# Patient Record
Sex: Male | Born: 1950
Health system: Southern US, Community
[De-identification: ages and names within clinical notes are randomized; demographics above are authoritative.]

## PROBLEM LIST (undated history)

## (undated) DIAGNOSIS — K219 Gastro-esophageal reflux disease without esophagitis: Secondary | ICD-10-CM

## (undated) DIAGNOSIS — J449 Chronic obstructive pulmonary disease, unspecified: Secondary | ICD-10-CM

## (undated) DIAGNOSIS — I700 Atherosclerosis of aorta: Secondary | ICD-10-CM

## (undated) DIAGNOSIS — R112 Nausea with vomiting, unspecified: Secondary | ICD-10-CM

## (undated) DIAGNOSIS — C801 Malignant (primary) neoplasm, unspecified: Secondary | ICD-10-CM

## (undated) DIAGNOSIS — I2510 Atherosclerotic heart disease of native coronary artery without angina pectoris: Secondary | ICD-10-CM

## (undated) DIAGNOSIS — C61 Malignant neoplasm of prostate: Secondary | ICD-10-CM

## (undated) DIAGNOSIS — Z9889 Other specified postprocedural states: Secondary | ICD-10-CM

## (undated) DIAGNOSIS — I10 Essential (primary) hypertension: Secondary | ICD-10-CM

## (undated) DIAGNOSIS — F419 Anxiety disorder, unspecified: Secondary | ICD-10-CM

## (undated) DIAGNOSIS — M1990 Unspecified osteoarthritis, unspecified site: Secondary | ICD-10-CM

## (undated) HISTORY — PX: KNEE SURGERY: SHX244

## (undated) HISTORY — PX: EYE SURGERY: SHX253

## (undated) HISTORY — PX: TONSILLECTOMY: SUR1361

---
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
## 1993-12-03 HISTORY — PX: KNEE SURGERY: SHX244

## 1994-12-03 HISTORY — PX: FLEXIBLE SIGMOIDOSCOPY: SHX1649

## 2001-12-03 HISTORY — PX: COLONOSCOPY: SHX174

## 2004-04-20 HISTORY — PX: HEMORRHOID SURGERY: SHX153

## 2015-07-12 DIAGNOSIS — H269 Unspecified cataract: Secondary | ICD-10-CM | POA: Insufficient documentation

## 2015-09-07 ENCOUNTER — Encounter: Payer: Self-pay | Admitting: *Deleted

## 2015-09-07 DIAGNOSIS — Z0000 Encounter for general adult medical examination without abnormal findings: Secondary | ICD-10-CM | POA: Insufficient documentation

## 2015-09-14 ENCOUNTER — Encounter: Payer: Self-pay | Admitting: General Surgery

## 2015-09-14 ENCOUNTER — Ambulatory Visit (INDEPENDENT_AMBULATORY_CARE_PROVIDER_SITE_OTHER): Payer: No Typology Code available for payment source | Admitting: General Surgery

## 2015-09-14 VITALS — BP 140/60 | HR 76 | Resp 14 | Ht 71.0 in | Wt 225.0 lb

## 2015-09-14 DIAGNOSIS — K429 Umbilical hernia without obstruction or gangrene: Secondary | ICD-10-CM

## 2015-09-14 NOTE — Progress Notes (Signed)
Patient ID: Adrian Hart, male   DOB: 19-Aug-1951, 64 y.o.   MRN: [id number]  Chief Complaint  Patient presents with  . Other    umbilical hernia    HPI Adrian Hart is a 64 y.o. male here today for a evaluation of a umbilical hernia. Patient states he noticed this area about 5-6 years ago when he was lifting office furniture. However, about 3 years ago it started bulging out. Bowels moving regular and daily. He denies any pain to the area but does have some soreness with lifting. He states up until about 3 weeks ago he could push it back in but not anymore.   HPI  No past medical history on file.  Past Surgical History  Procedure Laterality Date  . Hemorrhoid surgery  04-20-04    Dr Bary Castilla  . Colonoscopy  2003    Dr Alveta Heimlich  . Knee surgery    . Flexible sigmoidoscopy  1996    Dr Bary Castilla    No family history on file.  Social History Social History  Substance Use Topics  . Smoking status: Former Smoker -- 1.00 packs/day for 40 years    Types: Cigarettes  . Smokeless tobacco: Never Used  . Alcohol Use: No    No Known Allergies  Current Outpatient Prescriptions  Medication Sig Dispense Refill  . Cyanocobalamin (RA VITAMIN B-12 TR) 1000 MCG TBCR Take by mouth.    . folic acid (FOLVITE) 132 MCG tablet Take 400 mcg by mouth daily.    . magnesium oxide (MAG-OX) 400 MG tablet Take by mouth.    . Multiple Vitamin (MULTI-VITAMINS) TABS Take by mouth.    . pyridOXINE (VITAMIN B-6) 25 MG tablet Take by mouth.    . zinc gluconate 50 MG tablet Take 50 mg by mouth daily.     No current facility-administered medications for this visit.    Review of Systems Review of Systems  Constitutional: Negative.   Respiratory: Positive for cough.   Cardiovascular: Negative.   Gastrointestinal: Negative for nausea, diarrhea and constipation.    Blood pressure 140/60, pulse 76, resp. rate 14, height 5\' 11"  (1.803 m), weight 225 lb (102.059 kg).  Physical Exam Physical Exam   Constitutional: He is oriented to person, place, and time. He appears well-developed and well-nourished.  HENT:  Mouth/Throat: Oropharynx is clear and moist.  Eyes: Conjunctivae are normal. No scleral icterus.  Neck: Neck supple.  Cardiovascular: Normal rate, regular rhythm and normal heart sounds.   Pulmonary/Chest: Effort normal and breath sounds normal.  Abdominal: Soft. Normal appearance. A hernia is present.  2 cm umbilical defect.  Lymphadenopathy:    He has no cervical adenopathy.  Neurological: He is alert and oriented to person, place, and time.  Skin: Skin is warm and dry.  Psychiatric: His behavior is normal.    Data Reviewed 07/29/2002 colonoscopy for rectal bleeding reviewed. Internal hemorrhoids identified.    Assessment    Umbilical hernia.    Plan    Based on its size and his physical activity elective repair was encouraged.    Hernia precautions and incarceration were discussed with the patient. If they develop symptoms of an incarcerated hernia, they were encouraged to seek prompt medical attention.  I have recommended repair of the hernia with the possible use of mesh on an outpatient basis in the near future. The risk of infection was reviewed. The role of prosthetic mesh to minimize the risk of recurrence was reviewed.  Patient's surgery has been scheduled for 09-27-15  at [facility].   The patient reports he is scheduled for a colonoscopy consultation with the GI clinic at [facility].   PCP:  Adrian Hart 09/14/2015, 9:19 PM

## 2015-09-14 NOTE — Patient Instructions (Addendum)
Hernia, Adult A hernia is the bulging of an organ or tissue through a weak spot in the muscles of the abdomen (abdominal wall). Hernias develop most often near the navel or groin. There are many kinds of hernias. Common kinds include:  Femoral hernia. This kind of hernia develops under the groin in the upper thigh area.  Inguinal hernia. This kind of hernia develops in the groin or scrotum.  Umbilical hernia. This kind of hernia develops near the navel.  Hiatal hernia. This kind of hernia causes part of the stomach to be pushed up into the chest.  Incisional hernia. This kind of hernia bulges through a scar from an abdominal surgery. CAUSES This condition may be caused by:  Heavy lifting.  Coughing over a long period of time.  Straining to have a bowel movement.  An incision made during an abdominal surgery.  A birth defect (congenital defect).  Excess weight or obesity.  Smoking.  Poor nutrition.  Cystic fibrosis.  Excess fluid in the abdomen.  Undescended testicles. SYMPTOMS Symptoms of a hernia include:  A lump on the abdomen. This is the first sign of a hernia. The lump may become more obvious with standing, straining, or coughing. It may get bigger over time if it is not treated or if the condition causing it is not treated.  Pain. A hernia is usually painless, but it may become painful over time if treatment is delayed. The pain is usually dull and may get worse with standing or lifting heavy objects. Sometimes a hernia gets tightly squeezed in the weak spot (strangulated) or stuck there (incarcerated) and causes additional symptoms. These symptoms may include:  Vomiting.  Nausea.  Constipation.  Irritability. DIAGNOSIS A hernia may be diagnosed with:  A physical exam. During the exam your health care provider may ask you to cough or to make a specific movement, because a hernia is usually more visible when you move.  Imaging tests. These can  include:  X-rays.  Ultrasound.  CT scan. TREATMENT A hernia that is small and painless may not need to be treated. A hernia that is large or painful may be treated with surgery. Inguinal hernias may be treated with surgery to prevent incarceration or strangulation. Strangulated hernias are always treated with surgery, because lack of blood to the trapped organ or tissue can cause it to die. Surgery to treat a hernia involves pushing the bulge back into place and repairing the weak part of the abdomen. HOME CARE INSTRUCTIONS  Avoid straining.  Do not lift anything heavier than 10 lb (4.5 kg).  Lift with your leg muscles, not your back muscles. This helps avoid strain.  When coughing, try to cough gently.  Prevent constipation. Constipation leads to straining with bowel movements, which can make a hernia worse or cause a hernia repair to break down. You can prevent constipation by:  Eating a high-fiber diet that includes plenty of fruits and vegetables.  Drinking enough fluids to keep your urine clear or pale yellow. Aim to drink 6-8 glasses of water per day.  Using a stool softener as directed by your health care provider.  Lose weight, if you are overweight.  Do not use any tobacco products, including cigarettes, chewing tobacco, or electronic cigarettes. If you need help quitting, ask your health care provider.  Keep all follow-up visits as directed by your health care provider. This is important. Your health care provider may need to monitor your condition. SEEK MEDICAL CARE IF:  You have   swelling, redness, and pain in the affected area.  Your bowel habits change. SEEK IMMEDIATE MEDICAL CARE IF:  You have a fever.  You have abdominal pain that is getting worse.  You feel nauseous or you vomit.  You cannot push the hernia back in place by gently pressing on it while you are lying down.  The hernia:  Changes in shape or size.  Is stuck outside the  abdomen.  Becomes discolored.  Feels hard or tender.   This information is not intended to replace advice given to you by your health care provider. Make sure you discuss any questions you have with your health care provider.   Document Released: 11/19/2005 Document Revised: 12/10/2014 Document Reviewed: 09/29/2014 Elsevier Interactive Patient Education 2016 [place].  Patient's surgery has been scheduled for 09-27-15 at [facility].

## 2015-09-19 ENCOUNTER — Other Ambulatory Visit: Payer: Self-pay

## 2015-09-19 ENCOUNTER — Encounter: Payer: Self-pay | Admitting: *Deleted

## 2015-09-19 NOTE — Patient Instructions (Signed)
  Your procedure is scheduled on: 09-27-15 Report to [facility] To find out your arrival time please call [phone] between 1PM - 3PM on 09-26-15  Remember: Instructions that are not followed completely may result in serious medical risk, up to and including death, or upon the discretion of your surgeon and anesthesiologist your surgery may need to be rescheduled.    _X___ 1. Do not eat food or drink liquids after midnight. No gum chewing or hard candies.     _X___ 2. No Alcohol for 24 hours before or after surgery.   ____ 3. Bring all medications with you on the day of surgery if instructed.    ____ 4. Notify your doctor if there is any change in your medical condition     (cold, fever, infections).     Do not wear jewelry, make-up, hairpins, clips or nail polish.  Do not wear lotions, powders, or perfumes. You may wear deodorant.  Do not shave 48 hours prior to surgery. Men may shave face and neck.  Do not bring valuables to the hospital.    [facility] is not responsible for any belongings or valuables.               Contacts, dentures or bridgework may not be worn into surgery.  Leave your suitcase in the car. After surgery it may be brought to your room.  For patients admitted to the hospital, discharge time is determined by your  treatment team.   Patients discharged the day of surgery will not be allowed to drive home.   Please read over the following fact sheets that you were given:      ____ Take these medicines the morning of surgery with A SIP OF WATER:    1. IF MAGNESIUM OXIDE FALLS ON THE DAY OF YOUR SURGERY TAKE IT WITH A SMALL SIP OF WATER  2.   3.   4.  5.  6.  ____ Fleet Enema (as directed)   ____ Use CHG Soap as directed  ____ Use inhalers on the day of surgery  ____ Stop metformin 2 days prior to surgery    ____ Take 1/2 of usual insulin dose the night before surgery and none on the morning of surgery.   ____ Stop  Coumadin/Plavix/aspirin  ____ Stop Anti-inflammatories   ____ Stop supplements until after surgery.    ____ Bring C-Pap to the hospital.

## 2015-09-27 ENCOUNTER — Ambulatory Visit: Payer: No Typology Code available for payment source | Admitting: Anesthesiology

## 2015-09-27 ENCOUNTER — Ambulatory Visit
Admission: RE | Admit: 2015-09-27 | Discharge: 2015-09-27 | Disposition: A | Discharge status: Home / Self Care | Payer: No Typology Code available for payment source | Source: Ambulatory Visit | Attending: General Surgery | Admitting: General Surgery

## 2015-09-27 ENCOUNTER — Encounter
Admission: RE | Disposition: A | Discharge status: Home / Self Care | Payer: Self-pay | Source: Ambulatory Visit | Attending: General Surgery

## 2015-09-27 DIAGNOSIS — Z9889 Other specified postprocedural states: Secondary | ICD-10-CM | POA: Insufficient documentation

## 2015-09-27 DIAGNOSIS — Z87891 Personal history of nicotine dependence: Secondary | ICD-10-CM | POA: Diagnosis not present

## 2015-09-27 DIAGNOSIS — Z79899 Other long term (current) drug therapy: Secondary | ICD-10-CM | POA: Diagnosis not present

## 2015-09-27 DIAGNOSIS — K429 Umbilical hernia without obstruction or gangrene: Secondary | ICD-10-CM | POA: Insufficient documentation

## 2015-09-27 HISTORY — PX: UMBILICAL HERNIA REPAIR: SHX196

## 2015-09-27 HISTORY — PX: HERNIA REPAIR: SHX51

## 2015-09-27 HISTORY — DX: Chronic obstructive pulmonary disease, unspecified: J44.9

## 2015-09-27 HISTORY — DX: Gastro-esophageal reflux disease without esophagitis: K21.9

## 2015-09-27 SURGERY — REPAIR, HERNIA, UMBILICAL, ADULT
Anesthesia: General | Wound class: Clean

## 2015-09-27 MED ORDER — FAMOTIDINE 20 MG PO TABS
ORAL_TABLET | ORAL | Status: AC
  Administered 2015-09-27: 20 mg via ORAL
  Filled 2015-09-27: qty 1

## 2015-09-27 MED ORDER — CEFAZOLIN SODIUM-DEXTROSE 2-3 GM-% IV SOLR
INTRAVENOUS | Status: AC
  Administered 2015-09-27: 2 g via INTRAVENOUS
  Filled 2015-09-27: qty 50

## 2015-09-27 MED ORDER — SUCCINYLCHOLINE CHLORIDE 20 MG/ML IJ SOLN
INTRAMUSCULAR | Status: DC | PRN
  Administered 2015-09-27: 100 mg via INTRAVENOUS

## 2015-09-27 MED ORDER — MIDAZOLAM HCL 2 MG/2ML IJ SOLN
INTRAMUSCULAR | Status: DC | PRN
  Administered 2015-09-27: 2 mg via INTRAVENOUS

## 2015-09-27 MED ORDER — KETOROLAC TROMETHAMINE 30 MG/ML IJ SOLN
INTRAMUSCULAR | Status: DC | PRN
  Administered 2015-09-27: 30 mg via INTRAVENOUS

## 2015-09-27 MED ORDER — PROPOFOL 10 MG/ML IV BOLUS
INTRAVENOUS | Status: DC | PRN
  Administered 2015-09-27: 160 mg via INTRAVENOUS

## 2015-09-27 MED ORDER — NEOSTIGMINE METHYLSULFATE 10 MG/10ML IV SOLN
INTRAVENOUS | Status: DC | PRN
  Administered 2015-09-27: 4 mg via INTRAVENOUS

## 2015-09-27 MED ORDER — ROCURONIUM BROMIDE 100 MG/10ML IV SOLN
INTRAVENOUS | Status: DC | PRN
  Administered 2015-09-27: 5 mg via INTRAVENOUS
  Administered 2015-09-27: 25 mg via INTRAVENOUS

## 2015-09-27 MED ORDER — LIDOCAINE HCL (CARDIAC) 20 MG/ML IV SOLN
INTRAVENOUS | Status: DC | PRN
  Administered 2015-09-27: 40 mg via INTRAVENOUS

## 2015-09-27 MED ORDER — BUPIVACAINE HCL 0.5 % IJ SOLN
INTRAMUSCULAR | Status: DC | PRN
  Administered 2015-09-27: 30 mL

## 2015-09-27 MED ORDER — HYDROCODONE-ACETAMINOPHEN 5-325 MG PO TABS: 1.0000 | ORAL_TABLET | ORAL | Status: DC | PRN

## 2015-09-27 MED ORDER — BUPIVACAINE HCL (PF) 0.5 % IJ SOLN
INTRAMUSCULAR | Status: AC
  Filled 2015-09-27: qty 30

## 2015-09-27 MED ORDER — FENTANYL CITRATE (PF) 100 MCG/2ML IJ SOLN
INTRAMUSCULAR | Status: AC
  Administered 2015-09-27: 25 ug via INTRAVENOUS
  Filled 2015-09-27: qty 2

## 2015-09-27 MED ORDER — CEFAZOLIN SODIUM-DEXTROSE 2-3 GM-% IV SOLR
2.0000 g | INTRAVENOUS | Status: AC
  Administered 2015-09-27: 2 g via INTRAVENOUS

## 2015-09-27 MED ORDER — FAMOTIDINE 20 MG PO TABS
20.0000 mg | ORAL_TABLET | Freq: Once | ORAL | Status: AC
  Administered 2015-09-27: 20 mg via ORAL

## 2015-09-27 MED ORDER — ONDANSETRON HCL 4 MG/2ML IJ SOLN
INTRAMUSCULAR | Status: DC | PRN
  Administered 2015-09-27: 4 mg via INTRAVENOUS

## 2015-09-27 MED ORDER — FENTANYL CITRATE (PF) 100 MCG/2ML IJ SOLN
25.0000 ug | INTRAMUSCULAR | Status: AC | PRN
  Administered 2015-09-27 (×6): 25 ug via INTRAVENOUS

## 2015-09-27 MED ORDER — ONDANSETRON HCL 4 MG/2ML IJ SOLN: 4.0000 mg | Freq: Once | INTRAMUSCULAR | Status: DC | PRN

## 2015-09-27 MED ORDER — LACTATED RINGERS IV SOLN
INTRAVENOUS | Status: DC
  Administered 2015-09-27 (×2): via INTRAVENOUS

## 2015-09-27 MED ORDER — FENTANYL CITRATE (PF) 100 MCG/2ML IJ SOLN
INTRAMUSCULAR | Status: DC | PRN
  Administered 2015-09-27 (×2): 50 ug via INTRAVENOUS

## 2015-09-27 MED ORDER — GLYCOPYRROLATE 0.2 MG/ML IJ SOLN
INTRAMUSCULAR | Status: DC | PRN
  Administered 2015-09-27: 0.6 mg via INTRAVENOUS

## 2015-09-27 SURGICAL SUPPLY — 29 items
BLADE SURG 15 STRL SS SAFETY (BLADE) ×2 IMPLANT
CANISTER SUCT 1200ML W/VALVE (MISCELLANEOUS) ×2 IMPLANT
CHLORAPREP W/TINT 26ML (MISCELLANEOUS) ×2 IMPLANT
DRAPE LAPAROTOMY 100X77 ABD (DRAPES) ×2 IMPLANT
DRESSING TELFA 4X3 1S ST N-ADH (GAUZE/BANDAGES/DRESSINGS) ×2 IMPLANT
DRSG TEGADERM 4X4.75 (GAUZE/BANDAGES/DRESSINGS) ×2 IMPLANT
GAUZE SPONGE 4X4 12PLY STRL (GAUZE/BANDAGES/DRESSINGS) IMPLANT
GLOVE BIO SURGEON STRL SZ7.5 (GLOVE) ×6 IMPLANT
GLOVE INDICATOR 8.0 STRL GRN (GLOVE) ×2 IMPLANT
GOWN STRL REUS W/ TWL LRG LVL3 (GOWN DISPOSABLE) ×2 IMPLANT
GOWN STRL REUS W/TWL LRG LVL3 (GOWN DISPOSABLE) ×2
KIT RM TURNOVER STRD PROC AR (KITS) ×2 IMPLANT
LABEL OR SOLS (LABEL) IMPLANT
NDL SAFETY 22GX1.5 (NEEDLE) ×2 IMPLANT
NEEDLE HYPO 25X1 1.5 SAFETY (NEEDLE) IMPLANT
NS IRRIG 500ML POUR BTL (IV SOLUTION) ×2 IMPLANT
PACK BASIN MINOR ARMC (MISCELLANEOUS) ×2 IMPLANT
PAD GROUND ADULT SPLIT (MISCELLANEOUS) ×2 IMPLANT
STRIP CLOSURE SKIN 1/2X4 (GAUZE/BANDAGES/DRESSINGS) ×2 IMPLANT
SUT PROLENE 0 CT 1 30 (SUTURE) ×2 IMPLANT
SUT SURGILON 0 BLK (SUTURE) ×2 IMPLANT
SUT VIC AB 3-0 54X BRD REEL (SUTURE) ×1 IMPLANT
SUT VIC AB 3-0 BRD 54 (SUTURE) ×1
SUT VIC AB 3-0 SH 27 (SUTURE) ×1
SUT VIC AB 3-0 SH 27X BRD (SUTURE) ×1 IMPLANT
SUT VIC AB 4-0 FS2 27 (SUTURE) ×2 IMPLANT
SWABSTK COMLB BENZOIN TINCTURE (MISCELLANEOUS) ×2 IMPLANT
SYR 3ML LL SCALE MARK (SYRINGE) IMPLANT
SYR CONTROL 10ML (SYRINGE) ×4 IMPLANT

## 2015-09-27 NOTE — Op Note (Signed)
Preoperative diagnosis: Umbilical hernia.  Postoperative diagnosis: Same.  Operative procedure: Repair of umbilical hernia.  Operative surgeon: Ollen Bowl, M.D.  Anesthesia: Gen. endotracheal, Marcaine 0.5% plain, 30 mL local infiltration.  Estimated blood loss: Less than 5 mL.  Clinical note this 64 year old male has had a long-standing of the hernia and is here for elective repair.  Operative note: With the patient under adequate general anesthesia and hair previously removed from the surgical field with clippers the abdomen was prepped with ChloraPrep and draped. He received Ancef prior the procedure. An infraumbilical incision was made and carried down through skin and subcutaneous tissue with hemostasis achieved by electrocautery. The umbilical skin was elevated off the hernia sac which was excised at the fascial level. This was fairly thickened consistent with a long-standing presence of the hernia. The fascial defect was less than 3 cm in diameter and a primary repair was chosen. The undersurface of the fascia was clear and under direct vision 0 Surgilon figure-of-eight sutures were placed at each corner and then interrupted 0 Surgilon sutures were placed and sequentially tied. The umbilical skin was tacked to the fascia with 3-0 Vicryl sutures. The adipose layer was approximated with a running 3-0 Vicryl suture and the skin closed with a running 4-0 Vicryl subcuticular suture. Benzoin, Steri-Strips, Telfa and Tegaderm dressing was applied.  The patient tolerated the procedure well and was brought to the recovery room in stable condition.

## 2015-09-27 NOTE — Anesthesia Postprocedure Evaluation (Signed)
  Anesthesia Post-op Note  Patient: Adrian Hart  Procedure(s) Performed: Procedure(s): HERNIA REPAIR UMBILICAL ADULT (N/A)  Anesthesia type:General  Patient location: PACU  Post pain: Pain level controlled  Post assessment: Post-op Vital signs reviewed, Patient's Cardiovascular Status Stable, Respiratory Function Stable, Patent Airway and No signs of Nausea or vomiting  Post vital signs: Reviewed and stable  Last Vitals:  Filed Vitals:   09/27/15 0907  BP:   Pulse: 65  Temp: 36.3 C  Resp: 11    Level of consciousness: awake, alert  and patient cooperative  Complications: No apparent anesthesia complications

## 2015-09-27 NOTE — H&P (Signed)
No change in cardiopulmonary exam. For umbilical hernia repair.

## 2015-09-27 NOTE — Anesthesia Procedure Notes (Signed)
Procedure Name: Intubation Date/Time: 09/27/2015 7:38 AM Performed by: Allean Found Pre-anesthesia Checklist: Emergency Drugs available, Patient identified, Suction available, Patient being monitored and Timeout performed Patient Re-evaluated:Patient Re-evaluated prior to inductionOxygen Delivery Method: Circle system utilized Preoxygenation: Pre-oxygenation with 100% oxygen Intubation Type: IV induction Laryngoscope Size: Mac and 3 Grade View: Grade I Tube type: Oral Number of attempts: 1 Airway Equipment and Method: Stylet Placement Confirmation: ETT inserted through vocal cords under direct vision,  positive ETCO2 and breath sounds checked- equal and bilateral Secured at: 22 cm Tube secured with: Tape

## 2015-09-27 NOTE — Anesthesia Preprocedure Evaluation (Signed)
Anesthesia Evaluation  Patient identified by MRN, date of birth, ID band Patient awake    Reviewed: Allergy & Precautions, NPO status , Patient's Chart, lab work & pertinent test results  Airway Mallampati: II       Dental  (+) Implants   Pulmonary COPD, former smoker,     + decreased breath sounds      Cardiovascular hypertension, Normal cardiovascular exam     Neuro/Psych    GI/Hepatic Neg liver ROS, GERD  ,  Endo/Other    Renal/GU negative Renal ROS     Musculoskeletal negative musculoskeletal ROS (+)   Abdominal (+) + obese,   Peds  Hematology negative hematology ROS (+)   Anesthesia Other Findings   Reproductive/Obstetrics negative OB ROS                             Anesthesia Physical Anesthesia Plan  ASA: III  Anesthesia Plan: General   Post-op Pain Management:    Induction: Intravenous  Airway Management Planned: Oral ETT  Additional Equipment:   Intra-op Plan:   Post-operative Plan: Extubation in OR  Informed Consent: I have reviewed the patients History and Physical, chart, labs and discussed the procedure including the risks, benefits and alternatives for the proposed anesthesia with the patient or authorized representative who has indicated his/her understanding and acceptance.     Plan Discussed with: CRNA  Anesthesia Plan Comments:         Anesthesia Quick Evaluation

## 2015-09-27 NOTE — Transfer of Care (Signed)
Immediate Anesthesia Transfer of Care Note  Patient: Adrian Hart  Procedure(s) Performed: Procedure(s): HERNIA REPAIR UMBILICAL ADULT (N/A)  Patient Location: PACU  Anesthesia Type:General  Level of Consciousness: awake  Airway & Oxygen Therapy: Patient Spontanous Breathing and Patient connected to face mask oxygen  Post-op Assessment: Report given to RN and Post -op Vital signs reviewed and stable  Post vital signs: Reviewed and stable  Last Vitals:  Filed Vitals:   09/27/15 0823  BP: 147/104  Pulse: 71  Temp: 36.2 C  Resp: 16    Complications: No apparent anesthesia complications

## 2015-09-30 ENCOUNTER — Encounter: Payer: Self-pay | Admitting: General Surgery

## 2015-10-03 ENCOUNTER — Ambulatory Visit: Payer: Self-pay | Admitting: General Surgery

## 2015-10-05 ENCOUNTER — Ambulatory Visit (INDEPENDENT_AMBULATORY_CARE_PROVIDER_SITE_OTHER): Payer: No Typology Code available for payment source | Admitting: General Surgery

## 2015-10-05 ENCOUNTER — Encounter: Payer: Self-pay | Admitting: General Surgery

## 2015-10-05 VITALS — BP 124/74 | HR 86 | Resp 14 | Ht 72.0 in | Wt 224.0 lb

## 2015-10-05 DIAGNOSIS — K429 Umbilical hernia without obstruction or gangrene: Secondary | ICD-10-CM

## 2015-10-05 NOTE — Patient Instructions (Addendum)
Patient to return as needed. Proper lifting techniques reviewed. 

## 2015-10-05 NOTE — Progress Notes (Signed)
Patient ID: Adrian Hart, male   DOB: [birth year], 64 y.o.   MRN: [id number]  Chief Complaint  Patient presents with  . Routine Post Op    umbilical hernia     HPI Rik Wadel is a 64 y.o. male here today for his post op umbilical hernia repair done on 09/27/15. Patient states he is doing well.   He reported urinary frequency the first 2 days after surgery which has now resolved. He did have significant constipation which has relieved itself. He required very little in the way of oral narcotics. Made use of Tylenol as needed. HPI  Past Medical History  Diagnosis Date  . COPD (chronic obstructive pulmonary disease) ([place])     pt said he was told this 10 years ago but has no inhalers   . GERD (gastroesophageal reflux disease)     PT TAKES APPLE CIDER VINEGAR     Past Surgical History  Procedure Laterality Date  . Hemorrhoid surgery  04-20-04    Dr Bary Castilla  . Colonoscopy  2003    Dr Alveta Heimlich  . Knee surgery    . Flexible sigmoidoscopy  1996    Dr Bary Castilla  . Tonsillectomy    . Eye surgery      cataract  . Umbilical hernia repair N/A 09/27/2015    Procedure: HERNIA REPAIR UMBILICAL ADULT;  Surgeon: Robert Bellow, MD;  Location: [facility] ORS;  Service: General;  Laterality: N/A;  . Hernia repair  93/71/6967    Umbilical, primary repair    No family history on file.  Social History Social History  Substance Use Topics  . Smoking status: Former Smoker -- 1.00 packs/day for 40 years    Types: Cigarettes    Quit date: 09/18/2010  . Smokeless tobacco: Never Used  . Alcohol Use: No    No Known Allergies  Current Outpatient Prescriptions  Medication Sig Dispense Refill  . Cyanocobalamin (RA VITAMIN B-12 TR) 1000 MCG TBCR Take 1 tablet by mouth every other day.     . folic acid (FOLVITE) 893 MCG tablet Take 400 mcg by mouth every other day.     Marland Kitchen HYDROcodone-acetaminophen (NORCO) 5-325 MG tablet Take 1-2 tablets by mouth every 4 (four) hours as needed for moderate pain. 30  tablet 0  . magnesium oxide (MAG-OX) 400 MG tablet Take 400 mg by mouth every other day.     . Multiple Vitamin (MULTI-VITAMINS) TABS Take 1 tablet by mouth daily.     Marland Kitchen pyridOXINE (VITAMIN B-6) 25 MG tablet Take 25 mg by mouth every other day.     . zinc gluconate 50 MG tablet Take 50 mg by mouth every other day.      No current facility-administered medications for this visit.    Review of Systems Review of Systems  Constitutional: Negative.   Respiratory: Negative.   Cardiovascular: Negative.     Blood pressure 124/74, pulse 86, resp. rate 14, height 6' (1.829 m), weight 224 lb (101.606 kg).  Physical Exam Physical Exam  Constitutional: He is oriented to person, place, and time. He appears well-developed and well-nourished.  Eyes: Conjunctivae are normal. No scleral icterus.  Neck: Neck supple.  Abdominal: Soft. Normal appearance.    Incision site is clean and healing well.   Lymphadenopathy:    He has no cervical adenopathy.  Neurological: He is alert and oriented to person, place, and time.  Skin: Skin is warm and dry.      Assessment    Doing well status  post in the local hernia repair.    Plan    The patient will increase his activity as tolerated. Avoiding twisting was reviewed.    Patient to return as needed. Proper lifting techniques reviewed.  PCP:  Cephas Darby, Forest Gleason 10/05/2015, 1:23 PM

## 2016-12-10 DIAGNOSIS — M25562 Pain in left knee: Secondary | ICD-10-CM | POA: Diagnosis not present

## 2016-12-10 DIAGNOSIS — M1732 Unilateral post-traumatic osteoarthritis, left knee: Secondary | ICD-10-CM | POA: Insufficient documentation

## 2016-12-10 DIAGNOSIS — G8929 Other chronic pain: Secondary | ICD-10-CM | POA: Diagnosis not present

## 2017-01-09 DIAGNOSIS — R7303 Prediabetes: Secondary | ICD-10-CM | POA: Insufficient documentation

## 2017-01-09 DIAGNOSIS — R7309 Other abnormal glucose: Secondary | ICD-10-CM | POA: Diagnosis not present

## 2017-01-09 DIAGNOSIS — Z0000 Encounter for general adult medical examination without abnormal findings: Secondary | ICD-10-CM | POA: Diagnosis not present

## 2017-01-09 DIAGNOSIS — Z23 Encounter for immunization: Secondary | ICD-10-CM | POA: Diagnosis not present

## 2017-04-03 DIAGNOSIS — M1712 Unilateral primary osteoarthritis, left knee: Secondary | ICD-10-CM | POA: Diagnosis not present

## 2017-07-03 DIAGNOSIS — Z1211 Encounter for screening for malignant neoplasm of colon: Secondary | ICD-10-CM | POA: Diagnosis not present

## 2017-07-19 ENCOUNTER — Other Ambulatory Visit: Payer: Self-pay | Admitting: Student

## 2017-07-19 DIAGNOSIS — M1712 Unilateral primary osteoarthritis, left knee: Secondary | ICD-10-CM | POA: Diagnosis not present

## 2017-07-19 DIAGNOSIS — M1711 Unilateral primary osteoarthritis, right knee: Secondary | ICD-10-CM

## 2017-07-22 ENCOUNTER — Other Ambulatory Visit: Payer: Self-pay | Admitting: Student

## 2017-07-22 DIAGNOSIS — M1712 Unilateral primary osteoarthritis, left knee: Secondary | ICD-10-CM

## 2017-07-29 ENCOUNTER — Ambulatory Visit
Admission: RE | Admit: 2017-07-29 | Discharge: 2017-07-29 | Disposition: A | Discharge status: Home / Self Care | Payer: PPO | Source: Ambulatory Visit | Attending: Student | Admitting: Student

## 2017-07-29 DIAGNOSIS — S83281A Other tear of lateral meniscus, current injury, right knee, initial encounter: Secondary | ICD-10-CM | POA: Diagnosis not present

## 2017-07-29 DIAGNOSIS — M1712 Unilateral primary osteoarthritis, left knee: Secondary | ICD-10-CM | POA: Insufficient documentation

## 2017-07-29 DIAGNOSIS — S83242A Other tear of medial meniscus, current injury, left knee, initial encounter: Secondary | ICD-10-CM | POA: Insufficient documentation

## 2017-07-29 DIAGNOSIS — M25561 Pain in right knee: Secondary | ICD-10-CM | POA: Diagnosis not present

## 2017-07-29 DIAGNOSIS — M1711 Unilateral primary osteoarthritis, right knee: Secondary | ICD-10-CM | POA: Insufficient documentation

## 2017-07-29 DIAGNOSIS — X58XXXA Exposure to other specified factors, initial encounter: Secondary | ICD-10-CM | POA: Diagnosis not present

## 2017-08-12 DIAGNOSIS — M23204 Derangement of unspecified medial meniscus due to old tear or injury, left knee: Secondary | ICD-10-CM | POA: Diagnosis not present

## 2017-08-12 DIAGNOSIS — M1731 Unilateral post-traumatic osteoarthritis, right knee: Secondary | ICD-10-CM | POA: Diagnosis not present

## 2017-08-12 DIAGNOSIS — M1732 Unilateral post-traumatic osteoarthritis, left knee: Secondary | ICD-10-CM | POA: Diagnosis not present

## 2017-08-12 DIAGNOSIS — M23203 Derangement of unspecified medial meniscus due to old tear or injury, right knee: Secondary | ICD-10-CM | POA: Diagnosis not present

## 2017-08-22 DIAGNOSIS — M23203 Derangement of unspecified medial meniscus due to old tear or injury, right knee: Secondary | ICD-10-CM | POA: Diagnosis not present

## 2017-08-22 DIAGNOSIS — R7309 Other abnormal glucose: Secondary | ICD-10-CM | POA: Diagnosis not present

## 2017-08-22 DIAGNOSIS — J069 Acute upper respiratory infection, unspecified: Secondary | ICD-10-CM | POA: Diagnosis not present

## 2017-08-28 ENCOUNTER — Encounter
Admission: RE | Admit: 2017-08-28 | Discharge: 2017-08-28 | Disposition: A | Discharge status: Home / Self Care | Payer: PPO | Source: Ambulatory Visit | Attending: Surgery | Admitting: Surgery

## 2017-08-28 ENCOUNTER — Ambulatory Visit
Admission: RE | Admit: 2017-08-28 | Discharge: 2017-08-28 | Disposition: A | Discharge status: Home / Self Care | Payer: PPO | Source: Ambulatory Visit | Attending: Surgery | Admitting: Surgery

## 2017-08-28 DIAGNOSIS — M23221 Derangement of posterior horn of medial meniscus due to old tear or injury, right knee: Secondary | ICD-10-CM | POA: Diagnosis not present

## 2017-08-28 DIAGNOSIS — Z01818 Encounter for other preprocedural examination: Secondary | ICD-10-CM | POA: Diagnosis not present

## 2017-08-28 DIAGNOSIS — H2512 Age-related nuclear cataract, left eye: Secondary | ICD-10-CM | POA: Diagnosis not present

## 2017-08-28 DIAGNOSIS — Z01812 Encounter for preprocedural laboratory examination: Secondary | ICD-10-CM | POA: Diagnosis not present

## 2017-08-28 DIAGNOSIS — H268 Other specified cataract: Secondary | ICD-10-CM | POA: Diagnosis not present

## 2017-08-28 DIAGNOSIS — Z01810 Encounter for preprocedural cardiovascular examination: Secondary | ICD-10-CM | POA: Insufficient documentation

## 2017-08-28 DIAGNOSIS — M1711 Unilateral primary osteoarthritis, right knee: Secondary | ICD-10-CM | POA: Diagnosis not present

## 2017-08-28 DIAGNOSIS — R05 Cough: Secondary | ICD-10-CM | POA: Diagnosis not present

## 2017-08-28 LAB — BASIC METABOLIC PANEL
Anion gap: 8 (ref 5–15)
BUN: 17 mg/dL (ref 6–20)
CO2: 26 mmol/L (ref 22–32)
Calcium: 9.3 mg/dL (ref 8.9–10.3)
Chloride: 109 mmol/L (ref 101–111)
Creatinine, Ser: 0.87 mg/dL (ref 0.61–1.24)
GFR calc Af Amer: >60 mL/min (ref >60)
GFR calc non Af Amer: >60 mL/min (ref >60)
Glucose, Bld: 92 mg/dL (ref 65–99)
Potassium: 4.1 mmol/L (ref 3.5–5.1)
Sodium: 143 mmol/L (ref 135–145)

## 2017-08-28 LAB — SURGICAL PCR SCREEN
MRSA, PCR: NEGATIVE
Staphylococcus aureus: NEGATIVE

## 2017-08-28 LAB — CBC
HCT: 42.3 % (ref 40.0–52.0)
Hemoglobin: 14.9 g/dL (ref 13.0–18.0)
MCH: 30 pg (ref 26.0–34.0)
MCHC: 35.3 g/dL (ref 32.0–36.0)
MCV: 84.9 fL (ref 80.0–100.0)
Platelets: 221 10*3/uL (ref 150–440)
RBC: 4.99 MIL/uL (ref 4.40–5.90)
RDW: 13.4 % (ref 11.5–14.5)
WBC: 7.4 10*3/uL (ref 3.8–10.6)

## 2017-08-28 LAB — URINALYSIS, ROUTINE W REFLEX MICROSCOPIC
Bilirubin Urine: NEGATIVE
Glucose, UA: NEGATIVE mg/dL
Hgb urine dipstick: NEGATIVE
Ketones, ur: NEGATIVE mg/dL
Leukocytes, UA: NEGATIVE
Nitrite: NEGATIVE
Protein, ur: NEGATIVE mg/dL
Specific Gravity, Urine: 1.025 (ref 1.005–1.030)
pH: 5.5 (ref 5.0–8.0)

## 2017-08-28 LAB — TYPE AND SCREEN
ABO/RH(D): O POS
Antibody Screen: NEGATIVE

## 2017-08-28 LAB — PROTIME-INR
INR: 1.01
Prothrombin Time: 13.2 seconds (ref 11.4–15.2)

## 2017-08-28 NOTE — Patient Instructions (Signed)
Your procedure is scheduled on: Thursday, September 05, 2017 Report to Same Day Surgery on the 2nd floor in the [facility]. To find out your arrival time, please call [phone] between 1PM - 3PM on: Wednesday, September 04, 2017  REMEMBER: Instructions that are not followed completely may result in serious medical risk up to and including death; or upon the discretion of your surgeon and anesthesiologist your surgery may need to be rescheduled.  Do not eat food or drink liquids after midnight. No gum chewing or hard candies.  You may however, drink CLEAR liquids up to 2 hours before you are scheduled to arrive at the hospital for your procedure.  Do not drink clear liquids within 2 hours of your scheduled arrival to the hospital as this may lead to your procedure being delayed or rescheduled.  Clear liquids include: - water  - apple juice without pulp - clear gatorade - black coffee or tea (NO milk, creamers, sugars) DO NOT drink anything not on this list.  No Alcohol for 24 hours before or after surgery.  No Smoking for 24 hours prior to surgery.  Notify your doctor if there is any change in your medical condition (cold, fever, infection).  Do not wear jewelry, make-up, hairpins, clips or nail polish.  Do not wear lotions, powders, or perfumes.   Do not shave 48 hours prior to surgery. Men may shave face and neck.  Contacts and dentures may not be worn into surgery.  Do not bring valuables to the hospital. [facility] is not responsible for any belongings or valuables.   TAKE THESE MEDICATIONS THE MORNING OF SURGERY WITH A SIP OF WATER:  none  Use CHG Soap as directed on instruction sheet.  Stop Anti-inflammatories such as Advil, Aleve, Ibuprofen, Motrin, Naproxen, Naprosyn, Goodie powder, or aspirin products. (May take Tylenol or Acetaminophen and Celebrex if needed.)  NOW!  Stop supplements until after surgery:  Flax seed, psyllium  (May continue Vitamin B, and  multivitamin.)  If you are being admitted to the hospital overnight, leave your suitcase in the car. After surgery it may be brought to your room.   If you are taking public transportation, you will need to have a responsible adult to with you.  Please call the number above if you have any questions about these instructions.

## 2017-09-04 MED ORDER — CEFAZOLIN SODIUM-DEXTROSE 2-4 GM/100ML-% IV SOLN
2.0000 g | Freq: Once | INTRAVENOUS | Status: AC
  Administered 2017-09-05: 2 g via INTRAVENOUS

## 2017-09-05 ENCOUNTER — Inpatient Hospital Stay: Payer: PPO | Admitting: Certified Registered Nurse Anesthetist

## 2017-09-05 ENCOUNTER — Encounter: Payer: Self-pay | Admitting: Emergency Medicine

## 2017-09-05 ENCOUNTER — Encounter
Admission: RE | Disposition: A | Discharge status: Home / Self Care | Payer: Self-pay | Source: Ambulatory Visit | Attending: Surgery

## 2017-09-05 ENCOUNTER — Inpatient Hospital Stay: Payer: PPO

## 2017-09-05 ENCOUNTER — Inpatient Hospital Stay
Admission: RE | Admit: 2017-09-05 | Discharge: 2017-09-06 | DRG: 470 | Disposition: A | Discharge status: Home / Self Care | Payer: PPO | Source: Ambulatory Visit | Attending: Surgery | Admitting: Surgery

## 2017-09-05 DIAGNOSIS — M1731 Unilateral post-traumatic osteoarthritis, right knee: Principal | ICD-10-CM | POA: Diagnosis present

## 2017-09-05 DIAGNOSIS — Z9841 Cataract extraction status, right eye: Secondary | ICD-10-CM

## 2017-09-05 DIAGNOSIS — Z91013 Allergy to seafood: Secondary | ICD-10-CM | POA: Diagnosis not present

## 2017-09-05 DIAGNOSIS — M23203 Derangement of unspecified medial meniscus due to old tear or injury, right knee: Secondary | ICD-10-CM | POA: Diagnosis present

## 2017-09-05 DIAGNOSIS — K219 Gastro-esophageal reflux disease without esophagitis: Secondary | ICD-10-CM | POA: Diagnosis present

## 2017-09-05 DIAGNOSIS — Z961 Presence of intraocular lens: Secondary | ICD-10-CM | POA: Diagnosis not present

## 2017-09-05 DIAGNOSIS — Z471 Aftercare following joint replacement surgery: Secondary | ICD-10-CM | POA: Diagnosis not present

## 2017-09-05 DIAGNOSIS — J449 Chronic obstructive pulmonary disease, unspecified: Secondary | ICD-10-CM | POA: Diagnosis not present

## 2017-09-05 DIAGNOSIS — Z79899 Other long term (current) drug therapy: Secondary | ICD-10-CM | POA: Diagnosis not present

## 2017-09-05 DIAGNOSIS — Z23 Encounter for immunization: Secondary | ICD-10-CM

## 2017-09-05 DIAGNOSIS — M25561 Pain in right knee: Secondary | ICD-10-CM | POA: Diagnosis present

## 2017-09-05 DIAGNOSIS — Z96651 Presence of right artificial knee joint: Secondary | ICD-10-CM

## 2017-09-05 DIAGNOSIS — Z791 Long term (current) use of non-steroidal anti-inflammatories (NSAID): Secondary | ICD-10-CM | POA: Diagnosis not present

## 2017-09-05 DIAGNOSIS — Z89519 Acquired absence of unspecified leg below knee: Secondary | ICD-10-CM | POA: Diagnosis not present

## 2017-09-05 HISTORY — PX: PARTIAL KNEE ARTHROPLASTY: SHX2174

## 2017-09-05 LAB — ABO/RH: ABO/RH(D): O POS

## 2017-09-05 SURGERY — ARTHROPLASTY, KNEE, UNICOMPARTMENTAL
Anesthesia: Spinal | Site: Knee | Laterality: Right | Wound class: Clean

## 2017-09-05 MED ORDER — DIPHENHYDRAMINE HCL 12.5 MG/5ML PO ELIX: 12.5000 mg | ORAL_SOLUTION | ORAL | Status: DC | PRN

## 2017-09-05 MED ORDER — BUPIVACAINE HCL (PF) 0.5 % IJ SOLN
INTRAMUSCULAR | Status: DC | PRN
  Administered 2017-09-05: 3 mL

## 2017-09-05 MED ORDER — INFLUENZA VAC SPLIT HIGH-DOSE 0.5 ML IM SUSY
0.5000 mL | PREFILLED_SYRINGE | INTRAMUSCULAR | Status: AC
  Administered 2017-09-06: 0.5 mL via INTRAMUSCULAR
  Filled 2017-09-05: qty 0.5

## 2017-09-05 MED ORDER — CEFAZOLIN SODIUM-DEXTROSE 2-4 GM/100ML-% IV SOLN
INTRAVENOUS | Status: AC
  Filled 2017-09-05: qty 100

## 2017-09-05 MED ORDER — OXYCODONE HCL 5 MG PO TABS
5.0000 mg | ORAL_TABLET | ORAL | Status: DC | PRN
  Administered 2017-09-05: 10 mg via ORAL
  Administered 2017-09-05: 5 mg via ORAL
  Administered 2017-09-05 – 2017-09-06 (×3): 10 mg via ORAL
  Filled 2017-09-05 (×4): qty 2
  Filled 2017-09-05: qty 1

## 2017-09-05 MED ORDER — ACETAMINOPHEN 500 MG PO TABS
1000.0000 mg | ORAL_TABLET | Freq: Four times a day (QID) | ORAL | Status: AC
  Administered 2017-09-05 – 2017-09-06 (×4): 1000 mg via ORAL
  Filled 2017-09-05 (×4): qty 2

## 2017-09-05 MED ORDER — LIDOCAINE HCL (CARDIAC) 20 MG/ML IV SOLN
INTRAVENOUS | Status: DC | PRN
  Administered 2017-09-05: 80 mg via INTRAVENOUS

## 2017-09-05 MED ORDER — FAMOTIDINE 20 MG PO TABS
ORAL_TABLET | ORAL | Status: AC
  Administered 2017-09-05: 20 mg via ORAL
  Filled 2017-09-05: qty 1

## 2017-09-05 MED ORDER — PSYLLIUM 95 % PO PACK
1.0000 | PACK | Freq: Two times a day (BID) | ORAL | Status: DC
  Administered 2017-09-05 – 2017-09-06 (×2): 1 via ORAL
  Filled 2017-09-05 (×3): qty 1

## 2017-09-05 MED ORDER — ACETAMINOPHEN 650 MG RE SUPP: 650.0000 mg | Freq: Four times a day (QID) | RECTAL | Status: DC | PRN

## 2017-09-05 MED ORDER — ACETAMINOPHEN 10 MG/ML IV SOLN
INTRAVENOUS | Status: DC | PRN
  Administered 2017-09-05: 1000 mg via INTRAVENOUS

## 2017-09-05 MED ORDER — METOCLOPRAMIDE HCL 5 MG/ML IJ SOLN: 5.0000 mg | Freq: Three times a day (TID) | INTRAMUSCULAR | Status: DC | PRN

## 2017-09-05 MED ORDER — DOCUSATE SODIUM 100 MG PO CAPS
100.0000 mg | ORAL_CAPSULE | Freq: Two times a day (BID) | ORAL | Status: DC
Start: 2017-09-05 — End: 2017-09-06
  Administered 2017-09-05 – 2017-09-06 (×2): 100 mg via ORAL
  Filled 2017-09-05 (×2): qty 1

## 2017-09-05 MED ORDER — ACETAMINOPHEN 325 MG PO TABS
650.0000 mg | ORAL_TABLET | Freq: Four times a day (QID) | ORAL | Status: DC | PRN
Start: 2017-09-05 — End: 2017-09-06

## 2017-09-05 MED ORDER — KETOROLAC TROMETHAMINE 15 MG/ML IJ SOLN
15.0000 mg | Freq: Four times a day (QID) | INTRAMUSCULAR | Status: AC
  Administered 2017-09-05 – 2017-09-06 (×3): 15 mg via INTRAVENOUS
  Filled 2017-09-05 (×3): qty 1

## 2017-09-05 MED ORDER — PROPOFOL 10 MG/ML IV BOLUS
INTRAVENOUS | Status: AC
Start: 2017-09-05 — End: 2017-09-05
  Filled 2017-09-05: qty 20

## 2017-09-05 MED ORDER — KETOROLAC TROMETHAMINE 30 MG/ML IJ SOLN
INTRAMUSCULAR | Status: AC
  Filled 2017-09-05: qty 1

## 2017-09-05 MED ORDER — SODIUM CHLORIDE 0.9 % IJ SOLN
INTRAMUSCULAR | Status: AC
  Filled 2017-09-05: qty 50

## 2017-09-05 MED ORDER — CEFAZOLIN SODIUM-DEXTROSE 2-4 GM/100ML-% IV SOLN: 2.0000 g | Freq: Four times a day (QID) | INTRAVENOUS | Status: DC

## 2017-09-05 MED ORDER — FLEET ENEMA 7-19 GM/118ML RE ENEM: 1.0000 | ENEMA | Freq: Once | RECTAL | Status: DC | PRN

## 2017-09-05 MED ORDER — NEOMYCIN-POLYMYXIN B GU 40-200000 IR SOLN
Status: DC | PRN
  Administered 2017-09-05: 14 mL

## 2017-09-05 MED ORDER — BUPIVACAINE-EPINEPHRINE (PF) 0.5% -1:200000 IJ SOLN
INTRAMUSCULAR | Status: AC
  Filled 2017-09-05: qty 30

## 2017-09-05 MED ORDER — ENOXAPARIN SODIUM 40 MG/0.4ML ~~LOC~~ SOLN: 40.0000 mg | SUBCUTANEOUS | 0 refills | Status: DC

## 2017-09-05 MED ORDER — METOCLOPRAMIDE HCL 10 MG PO TABS: 5.0000 mg | ORAL_TABLET | Freq: Three times a day (TID) | ORAL | Status: DC | PRN

## 2017-09-05 MED ORDER — FENTANYL CITRATE (PF) 100 MCG/2ML IJ SOLN: 25.0000 ug | INTRAMUSCULAR | Status: DC | PRN

## 2017-09-05 MED ORDER — PHENYLEPHRINE HCL 10 MG/ML IJ SOLN
INTRAMUSCULAR | Status: AC
  Filled 2017-09-05: qty 1

## 2017-09-05 MED ORDER — BUPIVACAINE-EPINEPHRINE (PF) 0.5% -1:200000 IJ SOLN
INTRAMUSCULAR | Status: DC | PRN
  Administered 2017-09-05: 30 mL via PERINEURAL

## 2017-09-05 MED ORDER — EPHEDRINE SULFATE 50 MG/ML IJ SOLN
INTRAMUSCULAR | Status: AC
  Filled 2017-09-05: qty 1

## 2017-09-05 MED ORDER — ONDANSETRON HCL 4 MG/2ML IJ SOLN
INTRAMUSCULAR | Status: DC | PRN
  Administered 2017-09-05: 4 mg via INTRAVENOUS

## 2017-09-05 MED ORDER — KCL IN DEXTROSE-NACL 20-5-0.9 MEQ/L-%-% IV SOLN
INTRAVENOUS | Status: DC
  Administered 2017-09-05 – 2017-09-06 (×2): via INTRAVENOUS
  Filled 2017-09-05 (×4): qty 1000

## 2017-09-05 MED ORDER — TRANEXAMIC ACID 1000 MG/10ML IV SOLN
INTRAVENOUS | Status: AC
  Filled 2017-09-05: qty 10

## 2017-09-05 MED ORDER — BISACODYL 10 MG RE SUPP: 10.0000 mg | Freq: Every day | RECTAL | Status: DC | PRN

## 2017-09-05 MED ORDER — PANTOPRAZOLE SODIUM 40 MG PO TBEC
40.0000 mg | DELAYED_RELEASE_TABLET | Freq: Every day | ORAL | Status: DC
  Administered 2017-09-06: 40 mg via ORAL
  Filled 2017-09-05: qty 1

## 2017-09-05 MED ORDER — ONDANSETRON HCL 4 MG PO TABS: 4.0000 mg | ORAL_TABLET | Freq: Four times a day (QID) | ORAL | Status: DC | PRN

## 2017-09-05 MED ORDER — FENTANYL CITRATE (PF) 100 MCG/2ML IJ SOLN
INTRAMUSCULAR | Status: AC
  Filled 2017-09-05: qty 2

## 2017-09-05 MED ORDER — PROPOFOL 500 MG/50ML IV EMUL
INTRAVENOUS | Status: AC
  Filled 2017-09-05: qty 50

## 2017-09-05 MED ORDER — SUCCINYLCHOLINE CHLORIDE 20 MG/ML IJ SOLN
INTRAMUSCULAR | Status: AC
  Filled 2017-09-05: qty 1

## 2017-09-05 MED ORDER — B COMPLEX-C PO TABS
1.0000 | ORAL_TABLET | Freq: Every day | ORAL | Status: DC
  Administered 2017-09-06: 1 via ORAL
  Filled 2017-09-05 (×2): qty 1

## 2017-09-05 MED ORDER — SODIUM CHLORIDE 0.9 % IV SOLN
INTRAVENOUS | Status: DC | PRN
  Administered 2017-09-05: 60 mL

## 2017-09-05 MED ORDER — ONDANSETRON HCL 4 MG/2ML IJ SOLN
INTRAMUSCULAR | Status: AC
  Filled 2017-09-05: qty 2

## 2017-09-05 MED ORDER — LIDOCAINE HCL (PF) 2 % IJ SOLN
INTRAMUSCULAR | Status: AC
  Filled 2017-09-05: qty 4

## 2017-09-05 MED ORDER — BUPIVACAINE LIPOSOME 1.3 % IJ SUSP
INTRAMUSCULAR | Status: AC
  Filled 2017-09-05: qty 20

## 2017-09-05 MED ORDER — PROPOFOL 500 MG/50ML IV EMUL
INTRAVENOUS | Status: DC | PRN
  Administered 2017-09-05: 75 ug/kg/min via INTRAVENOUS

## 2017-09-05 MED ORDER — LACTATED RINGERS IV SOLN
INTRAVENOUS | Status: DC
  Administered 2017-09-05: 07:00:00 via INTRAVENOUS

## 2017-09-05 MED ORDER — MIDAZOLAM HCL 5 MG/5ML IJ SOLN
INTRAMUSCULAR | Status: DC | PRN
  Administered 2017-09-05: 2 mg via INTRAVENOUS

## 2017-09-05 MED ORDER — ADULT MULTIVITAMIN W/MINERALS CH
1.0000 | ORAL_TABLET | Freq: Every day | ORAL | Status: DC
  Administered 2017-09-05 – 2017-09-06 (×2): 1 via ORAL
  Filled 2017-09-05 (×2): qty 1

## 2017-09-05 MED ORDER — ENOXAPARIN SODIUM 40 MG/0.4ML ~~LOC~~ SOLN
40.0000 mg | SUBCUTANEOUS | Status: DC
  Administered 2017-09-06: 40 mg via SUBCUTANEOUS
  Filled 2017-09-05: qty 0.4

## 2017-09-05 MED ORDER — BUPIVACAINE HCL (PF) 0.5 % IJ SOLN
INTRAMUSCULAR | Status: AC
  Filled 2017-09-05: qty 10

## 2017-09-05 MED ORDER — FENTANYL CITRATE (PF) 100 MCG/2ML IJ SOLN
INTRAMUSCULAR | Status: DC | PRN
  Administered 2017-09-05 (×2): 50 ug via INTRAVENOUS

## 2017-09-05 MED ORDER — HYDROMORPHONE HCL 1 MG/ML IJ SOLN
1.0000 mg | INTRAMUSCULAR | Status: DC | PRN
  Administered 2017-09-05 (×2): 1 mg via INTRAVENOUS
  Filled 2017-09-05 (×3): qty 1

## 2017-09-05 MED ORDER — ACETAMINOPHEN 10 MG/ML IV SOLN
INTRAVENOUS | Status: AC
  Filled 2017-09-05: qty 100

## 2017-09-05 MED ORDER — GLYCOPYRROLATE 0.2 MG/ML IJ SOLN
INTRAMUSCULAR | Status: AC
  Filled 2017-09-05: qty 1

## 2017-09-05 MED ORDER — MIDAZOLAM HCL 2 MG/2ML IJ SOLN
INTRAMUSCULAR | Status: AC
  Filled 2017-09-05: qty 2

## 2017-09-05 MED ORDER — PROPOFOL 10 MG/ML IV BOLUS
INTRAVENOUS | Status: DC | PRN
  Administered 2017-09-05: 20 mg via INTRAVENOUS
  Administered 2017-09-05: 30 mg via INTRAVENOUS

## 2017-09-05 MED ORDER — DEXTROSE 5 % IV SOLN
2.0000 g | Freq: Four times a day (QID) | INTRAVENOUS | Status: AC
  Administered 2017-09-05 – 2017-09-06 (×3): 2 g via INTRAVENOUS
  Filled 2017-09-05 (×3): qty 2000

## 2017-09-05 MED ORDER — FLAX SEED OIL 1000 MG PO CAPS: ORAL_CAPSULE | Freq: Every day | ORAL | Status: DC

## 2017-09-05 MED ORDER — KETOROLAC TROMETHAMINE 30 MG/ML IJ SOLN
30.0000 mg | Freq: Once | INTRAMUSCULAR | Status: AC
  Administered 2017-09-05: 30 mg via INTRAVENOUS

## 2017-09-05 MED ORDER — OXYCODONE HCL 5 MG PO TABS: 5.0000 mg | ORAL_TABLET | ORAL | 0 refills | Status: DC | PRN

## 2017-09-05 MED ORDER — TRANEXAMIC ACID 1000 MG/10ML IV SOLN
INTRAVENOUS | Status: AC | PRN
  Administered 2017-09-05: 1000 mg via TOPICAL

## 2017-09-05 MED ORDER — FAMOTIDINE 20 MG PO TABS
20.0000 mg | ORAL_TABLET | Freq: Once | ORAL | Status: AC
  Administered 2017-09-05: 20 mg via ORAL

## 2017-09-05 MED ORDER — MAGNESIUM HYDROXIDE 400 MG/5ML PO SUSP: 30.0000 mL | Freq: Every day | ORAL | Status: DC | PRN

## 2017-09-05 MED ORDER — ONDANSETRON HCL 4 MG/2ML IJ SOLN: 4.0000 mg | Freq: Once | INTRAMUSCULAR | Status: DC | PRN

## 2017-09-05 MED ORDER — ACETAMINOPHEN 10 MG/ML IV SOLN: INTRAVENOUS | Status: DC | PRN

## 2017-09-05 MED ORDER — ONDANSETRON HCL 4 MG/2ML IJ SOLN: 4.0000 mg | Freq: Four times a day (QID) | INTRAMUSCULAR | Status: DC | PRN

## 2017-09-05 MED ORDER — TRAZODONE HCL 50 MG PO TABS: 50.0000 mg | ORAL_TABLET | Freq: Every evening | ORAL | Status: DC | PRN

## 2017-09-05 MED ORDER — NEOMYCIN-POLYMYXIN B GU 40-200000 IR SOLN
Status: AC
  Filled 2017-09-05: qty 20

## 2017-09-05 SURGICAL SUPPLY — 59 items
BANDAGE ACE 6X5 VEL STRL LF (GAUZE/BANDAGES/DRESSINGS) ×2 IMPLANT
BIT DRILL QUICK REL 1/8 2PK SL (DRILL) ×1 IMPLANT
BONE CEMENT PALACOSE (Cement) ×2 IMPLANT
CANISTER SUCT 1200ML W/VALVE (MISCELLANEOUS) ×2 IMPLANT
CANISTER SUCT 3000ML PPV (MISCELLANEOUS) ×2 IMPLANT
CAPT KNEE PARTIAL 2 ×2 IMPLANT
CATH FOL LEG HOLDER (MISCELLANEOUS) ×2 IMPLANT
CATH TRAY METER 16FR LF (MISCELLANEOUS) ×2 IMPLANT
CEMENT BONE PALACOSE (Cement) ×1 IMPLANT
CHLORAPREP W/TINT 26ML (MISCELLANEOUS) ×4 IMPLANT
COOLER POLAR GLACIER W/PUMP (MISCELLANEOUS) ×2 IMPLANT
COVER MAYO STAND STRL (DRAPES) ×2 IMPLANT
CUFF TOURN 24 STER (MISCELLANEOUS) IMPLANT
CUFF TOURN 30 STER DUAL PORT (MISCELLANEOUS) ×2 IMPLANT
DRAPE C-ARM XRAY 36X54 (DRAPES) IMPLANT
DRILL QUICK RELEASE 1/8 INCH (DRILL) ×1
DRSG OPSITE POSTOP 4X12 (GAUZE/BANDAGES/DRESSINGS) ×2 IMPLANT
DRSG OPSITE POSTOP 4X14 (GAUZE/BANDAGES/DRESSINGS) IMPLANT
DRSG OPSITE POSTOP 4X6 (GAUZE/BANDAGES/DRESSINGS) ×4 IMPLANT
ELECT CAUTERY BLADE 6.4 (BLADE) ×2 IMPLANT
ELECT REM PT RETURN 9FT ADLT (ELECTROSURGICAL) ×2
ELECTRODE REM PT RTRN 9FT ADLT (ELECTROSURGICAL) ×1 IMPLANT
GAUZE PETRO XEROFOAM 1X8 (MISCELLANEOUS) ×2 IMPLANT
GAUZE SPONGE 4X4 12PLY STRL (GAUZE/BANDAGES/DRESSINGS) ×2 IMPLANT
GLOVE BIO SURGEON STRL SZ7.5 (GLOVE) ×8 IMPLANT
GLOVE BIO SURGEON STRL SZ8 (GLOVE) ×8 IMPLANT
GLOVE BIOGEL PI IND STRL 8 (GLOVE) ×1 IMPLANT
GLOVE BIOGEL PI INDICATOR 8 (GLOVE) ×1
GLOVE INDICATOR 8.0 STRL GRN (GLOVE) ×2 IMPLANT
GOWN STRL REUS W/ TWL LRG LVL3 (GOWN DISPOSABLE) ×1 IMPLANT
GOWN STRL REUS W/ TWL XL LVL3 (GOWN DISPOSABLE) ×1 IMPLANT
GOWN STRL REUS W/TWL LRG LVL3 (GOWN DISPOSABLE) ×1
GOWN STRL REUS W/TWL XL LVL3 (GOWN DISPOSABLE) ×1
HOOD PEEL AWAY FLYTE STAYCOOL (MISCELLANEOUS) ×6 IMPLANT
KIT RM TURNOVER STRD PROC AR (KITS) ×2 IMPLANT
MAT BLUE FLOOR 46X72 FLO (MISCELLANEOUS) ×2 IMPLANT
NDL SAFETY 18GX1.5 (NEEDLE) ×2 IMPLANT
NEEDLE SPNL 20GX3.5 QUINCKE YW (NEEDLE) ×2 IMPLANT
NS IRRIG 1000ML POUR BTL (IV SOLUTION) ×2 IMPLANT
PACK BLADE SAW RECIP 70 3 PT (BLADE) ×2 IMPLANT
PACK TOTAL KNEE (MISCELLANEOUS) ×2 IMPLANT
PAD WRAPON POLAR KNEE (MISCELLANEOUS) ×1 IMPLANT
PULSAVAC PLUS IRRIG FAN TIP (DISPOSABLE) ×2
SOL .9 NS 3000ML IRR  AL (IV SOLUTION) ×1
SOL .9 NS 3000ML IRR UROMATIC (IV SOLUTION) ×1 IMPLANT
SPONGE XRAY 4X4 16PLY STRL (MISCELLANEOUS) ×2 IMPLANT
STAPLER SKIN PROX 35W (STAPLE) ×2 IMPLANT
STRAP SAFETY BODY (MISCELLANEOUS) ×2 IMPLANT
SUCTION FRAZIER HANDLE 10FR (MISCELLANEOUS) ×1
SUCTION TUBE FRAZIER 10FR DISP (MISCELLANEOUS) ×1 IMPLANT
SUT VIC AB 2-0 CT1 27 (SUTURE) ×4
SUT VIC AB 2-0 CT1 TAPERPNT 27 (SUTURE) ×4 IMPLANT
SYR 20CC LL (SYRINGE) ×2 IMPLANT
SYR 30ML LL (SYRINGE) ×6 IMPLANT
SYRINGE 10CC LL (SYRINGE) ×2 IMPLANT
SYSTEM VACUUM CEMENT MIXING (MISCELLANEOUS) ×2 IMPLANT
TAPE TRANSPORE STRL 2 31045 (GAUZE/BANDAGES/DRESSINGS) ×2 IMPLANT
TIP FAN IRRIG PULSAVAC PLUS (DISPOSABLE) ×1 IMPLANT
WRAPON POLAR PAD KNEE (MISCELLANEOUS) ×2

## 2017-09-05 NOTE — Anesthesia Post-op Follow-up Note (Signed)
Anesthesia QCDR form completed.        

## 2017-09-05 NOTE — NC FL2 (Signed)
[place] LEVEL OF CARE SCREENING TOOL     IDENTIFICATION  Patient Name: Adrian Hart Birthdate: 08-01-1951 Sex: male Admission Date (Current Location): 09/05/2017  [place] and [place] Number:  Engineering geologist and Address:  [facility], [address] [place], [place] [address]      Provider Number: [id number]  Attending Physician Name and Address:  Corky Mull, MD  Relative Name and Phone Number:       Current Level of Care: Hospital Recommended Level of Care: [place] Prior Approval Number:    Date Approved/Denied:   PASRR Number:    Discharge Plan: SNF    Current Diagnoses: Patient Active Problem List   Diagnosis Date Noted  . Status post right partial knee replacement 09/05/2017  . Umbilical hernia without obstruction and without gangrene 09/14/2015    Orientation RESPIRATION BLADDER Height & Weight     Self, Time, Situation, Place  Normal Continent Weight: 224 lb (101.6 kg) Height:  5\' 11"  (180.3 cm)  BEHAVIORAL SYMPTOMS/MOOD NEUROLOGICAL BOWEL NUTRITION STATUS      Continent Diet (Regular)  AMBULATORY STATUS COMMUNICATION OF NEEDS Skin   Extensive Assist Verbally Surgical wounds (Incision (Right Knee))                       Personal Care Assistance Level of Assistance  Bathing, Feeding, Dressing Bathing Assistance: Limited assistance Feeding assistance: Independent Dressing Assistance: Limited assistance     Functional Limitations Info  Sight, Hearing, Speech Sight Info: Adequate Hearing Info: Adequate Speech Info: Adequate    SPECIAL CARE FACTORS FREQUENCY  PT (By licensed PT), OT (By licensed OT)     PT Frequency:  (5) OT Frequency:  (5)            Contractures      Additional Factors Info  Code Status, Allergies Code Status Info:  (Full Code) Allergies Info:  (NKA)           Current Medications (09/05/2017):  This is the current hospital active medication  list Current Facility-Administered Medications  Medication Dose Route Frequency Provider Last Rate Last Dose  . acetaminophen (TYLENOL) tablet 650 mg  650 mg Oral Q6H PRN Poggi, Marshall Cork, MD       Or  . acetaminophen (TYLENOL) suppository 650 mg  650 mg Rectal Q6H PRN Poggi, Marshall Cork, MD      . acetaminophen (TYLENOL) tablet 1,000 mg  1,000 mg Oral Q6H Poggi, Marshall Cork, MD      . B-complex with vitamin C tablet 1 tablet  1 tablet Oral Daily Poggi, Marshall Cork, MD      . bisacodyl (DULCOLAX) suppository 10 mg  10 mg Rectal Daily PRN Poggi, Marshall Cork, MD      . ceFAZolin (ANCEF) 2 g in dextrose 5 % 100 mL IVPB  2 g Intravenous Q6H Poggi, John J, MD      . dextrose 5 % and 0.9 % NaCl with KCl 20 mEq/L infusion   Intravenous Continuous Poggi, Marshall Cork, MD      . diphenhydrAMINE (BENADRYL) 12.5 MG/5ML elixir 12.5-25 mg  12.5-25 mg Oral Q4H PRN Poggi, Marshall Cork, MD      . docusate sodium (COLACE) capsule 100 mg  100 mg Oral BID Poggi, Marshall Cork, MD      . Derrill Memo ON 09/06/2017] enoxaparin (LOVENOX) injection 40 mg  40 mg Subcutaneous Q24H Poggi, Marshall Cork, MD      . HYDROmorphone (  DILAUDID) injection 1-2 mg  1-2 mg Intravenous Q2H PRN Poggi, Marshall Cork, MD      . ketorolac (TORADOL) 15 MG/ML injection 15 mg  15 mg Intravenous Q6H Poggi, Marshall Cork, MD      . ketorolac (TORADOL) 30 MG/ML injection           . magnesium hydroxide (MILK OF MAGNESIA) suspension 30 mL  30 mL Oral Daily PRN Poggi, Marshall Cork, MD      . metoCLOPramide (REGLAN) tablet 5-10 mg  5-10 mg Oral Q8H PRN Poggi, Marshall Cork, MD       Or  . metoCLOPramide (REGLAN) injection 5-10 mg  5-10 mg Intravenous Q8H PRN Poggi, Marshall Cork, MD      . multivitamin with minerals tablet 1 tablet  1 tablet Oral Daily Poggi, Marshall Cork, MD      . ondansetron (ZOFRAN) tablet 4 mg  4 mg Oral Q6H PRN Poggi, Marshall Cork, MD       Or  . ondansetron (ZOFRAN) injection 4 mg  4 mg Intravenous Q6H PRN Poggi, Marshall Cork, MD      . oxyCODONE (Oxy IR/ROXICODONE) immediate release tablet 5-10 mg  5-10 mg Oral Q3H PRN  Poggi, Marshall Cork, MD      . pantoprazole (PROTONIX) EC tablet 40 mg  40 mg Oral Daily Poggi, Marshall Cork, MD      . psyllium (HYDROCIL/METAMUCIL) packet 1 packet  1 packet Oral BID Poggi, Marshall Cork, MD      . sodium phosphate (FLEET) 7-19 GM/118ML enema 1 enema  1 enema Rectal Once PRN Poggi, Marshall Cork, MD      . traZODone (DESYREL) tablet 50 mg  50 mg Oral QHS PRN Poggi, Marshall Cork, MD         Discharge Medications: Please see discharge summary for a list of discharge medications.  Relevant Imaging Results:  Relevant Lab Results:   Additional Information  (SSN: [id number])  Smith Mince, Student-Social Work

## 2017-09-05 NOTE — Evaluation (Signed)
Physical Therapy Evaluation Patient Details Name: Adrian Hart MRN: [id number] DOB: 22-Sep-1951 Today's Date: 09/05/2017   History of Present Illness  66 y/o male here s/p R partial knee replacement 09/05/17.  Clinical Impression  Pt did exceptionally well with POD0 PT exam. He showed great effort with all exercises including SLRs with resistance, AROM knee flexion to 116, circumambulating the nurses' station (with even a few steps w/o WBing through UEs) and generally achieved most acute care goals.  Pt plans on staying at daughter's home for a few days on initial discharge and did not have any real pain limitations.  Pt eager to do all he can, suspect he will continue to do quite well.     Follow Up Recommendations Outpatient PT vs HHPT per progress    Equipment Recommendations  Rolling walker with 5" wheels    Recommendations for Other Services       Precautions / Restrictions Precautions Precautions: Knee Restrictions Weight Bearing Restrictions: Yes RLE Weight Bearing: Weight bearing as tolerated      Mobility  Bed Mobility Overal bed mobility: Independent             General bed mobility comments: Pt easily gets up to EOB w/o assist  Transfers Overall transfer level: Independent Equipment used: Rolling walker (2 wheeled)             General transfer comment: Pt able to rise and maintain balance w/o use of walker.  Showed great confidence and safety.  Ambulation/Gait Ambulation/Gait assistance: Supervision Ambulation Distance (Feet): 200 Feet Assistive device: Rolling walker (2 wheeled)       General Gait Details: Pt did well with ambulation and had no hesitation, limp, and was even able to occasionally take steps w/o the walker (PT encouraged him to avoid overdoing it first time up)  Stairs            Wheelchair Mobility    Modified Rankin (Stroke Patients Only)       Balance Overall balance assessment: Independent                                            Pertinent Vitals/Pain Pain Assessment: 0-10 Pain Score: 1  (minimal pain increase with activity/WBing/ROM)    Home Living Family/patient expects to be discharged to:: Private residence Living Arrangements: Alone Available Help at Discharge: Family (will stay with daughter for a few days on discharge) Type of Home: House Home Access: Stairs to enter Entrance Stairs-Rails: None (daughter has wide b/l rails) Entrance Stairs-Number of Steps: 2 (5 steps at daughter's home)   Home Equipment: Environmental consultant - standard      Prior Function Level of Independence: Independent         Comments: Pt able to be active, do all he needed, lives alone     Hand Dominance        Extremity/Trunk Assessment        Lower Extremity Assessment Lower Extremity Assessment: Overall WFL for tasks assessed (R SLRs again resistance, 4/5 strength on R)       Communication   Communication: No difficulties  Cognition Arousal/Alertness: Awake/alert Behavior During Therapy: WFL for tasks assessed/performed Overall Cognitive Status: Within Functional Limits for tasks assessed  General Comments      Exercises Total Joint Exercises Ankle Circles/Pumps: AROM;10 reps Quad Sets: 10 reps;Strengthening Gluteal Sets: Strengthening;10 reps Heel Slides: Strengthening;10 reps Hip ABduction/ADduction: Strengthening;10 reps Straight Leg Raises: Strengthening;10 reps Knee Flexion: AROM;AAROM;5 reps Goniometric ROM: 0-116   Assessment/Plan    PT Assessment Patient needs continued PT services  PT Problem List Decreased strength;Decreased range of motion;Decreased activity tolerance;Decreased balance;Decreased mobility;Decreased coordination;Decreased knowledge of use of DME;Decreased safety awareness;Pain       PT Treatment Interventions DME instruction;Gait training;Stair training;Therapeutic activities;Functional  mobility training;Therapeutic exercise;Balance training;Patient/family education    PT Goals (Current goals can be found in the Care Plan section)  Acute Rehab PT Goals Patient Stated Goal: get back to walking and exercising PT Goal Formulation: With patient Time For Goal Achievement: 09/19/17 Potential to Achieve Goals: Good    Frequency BID   Barriers to discharge        Co-evaluation               AM-PAC PT "6 Clicks" Daily Activity  Outcome Measure Difficulty turning over in bed (including adjusting bedclothes, sheets and blankets)?: None Difficulty moving from lying on back to sitting on the side of the bed? : None Difficulty sitting down on and standing up from a chair with arms (e.g., wheelchair, bedside commode, etc,.)?: None Help needed moving to and from a bed to chair (including a wheelchair)?: None Help needed walking in hospital room?: None Help needed climbing 3-5 steps with a railing? : A Little 6 Click Score: 23    End of Session Equipment Utilized During Treatment: Gait belt Activity Tolerance: Patient tolerated treatment well Patient left: with chair alarm set;with call bell/phone within reach;with nursing/sitter in room;with family/visitor present   PT Visit Diagnosis: Muscle weakness (generalized) (M62.81);Difficulty in walking, not elsewhere classified (R26.2)    Time: 2951-8841 PT Time Calculation (min) (ACUTE ONLY): 34 min   Charges:   PT Evaluation $PT Eval Low Complexity: 1 Low PT Treatments $Therapeutic Exercise: 8-22 mins   PT G Codes:   PT G-Codes **NOT FOR INPATIENT CLASS** Functional Assessment Tool Used: AM-PAC 6 Clicks Basic Mobility Functional Limitation: Mobility: Walking and moving around Mobility: Walking and Moving Around Current Status (Y6063): At least 1 percent but less than 20 percent impaired, limited or restricted Mobility: Walking and Moving Around Goal Status [phone]): 0 percent impaired, limited or restricted     Kreg Shropshire, DPT 09/05/2017, 4:34 PM

## 2017-09-05 NOTE — Progress Notes (Signed)
Per Dr. Roland Rack, do not apply CPM due to pts incisional bleeding during pt. Ok to remove foley.

## 2017-09-05 NOTE — Discharge Instructions (Signed)

## 2017-09-05 NOTE — H&P (Signed)
Paper H&P to be scanned into permanent record. H&P reviewed and patient re-examined. No changes. 

## 2017-09-05 NOTE — Discharge Summary (Signed)
Physician Discharge Summary  Patient ID: Adrian Hart MRN: [id number] DOB/AGE: 1951/05/23 66 y.o.  Admit date: 09/05/2017 Discharge date: 09/06/17  Admission Diagnoses:  [place] OF RIGHT KNEE,POST TRAUMATIC OSTEOARTHRITIS OF RIGHT KNEE Osteoarthritis of medial compartment of right knee  Discharge Diagnoses: Patient Active Problem List   Diagnosis Date Noted  . Status post right partial knee replacement 09/05/2017  . Umbilical hernia without obstruction and without gangrene 09/14/2015  Osteoarthritis of medial compartment of right knee.  Past Medical History:  Diagnosis Date  . COPD (chronic obstructive pulmonary disease) ([place])    pt said he was told this 10 years ago but has no inhalers   . GERD (gastroesophageal reflux disease)    PT TAKES APPLE CIDER VINEGAR      Transfusion: None.   Consultants (if any):   Discharged Condition: Improved  Hospital Course: Adrian Hart is an 66 y.o. male who was admitted 09/05/2017 with a diagnosis of osteoarthritis of medial compartment of the right knee and went to the operating room on 09/05/2017 and underwent the above named procedures.    Surgeries: Procedure(s): UNICOMPARTMENTAL KNEE on 09/05/2017 Patient tolerated the surgery well. Taken to PACU where she was stabilized and then transferred to the orthopedic floor.  Started on Lovenox 40mg  q 24 hrs. Foot pumps applied bilaterally at 80 mm. Heels elevated on bed with rolled towels. No evidence of DVT. Negative [place]. Physical therapy started on day #1 for gait training and transfer. OT started day #1 for ADL and assisted devices.  Patient's IV and foley to be d/c on POD1.  Implants: All-cemented Biomet [place] system with a medium femoral component, a "E" sized tibial tray, and a 5 mm meniscal bearing insert.  He was given perioperative antibiotics:  Anti-infectives    Start     Dose/Rate Route Frequency Ordered Stop   09/05/17 1400  ceFAZolin (ANCEF)  2 g in dextrose 5 % 100 mL IVPB     2 g 200 mL/hr over 30 Minutes Intravenous Every 6 hours 09/05/17 1126 09/06/17 0759   09/05/17 1130  ceFAZolin (ANCEF) IVPB 2g/100 mL premix  Status:  Discontinued     2 g 200 mL/hr over 30 Minutes Intravenous Every 6 hours 09/05/17 1120 09/05/17 1126   09/05/17 0612  ceFAZolin (ANCEF) 2-4 GM/100ML-% IVPB    Comments:  Lyman Bishop   : cabinet override      09/05/17 0612 09/05/17 0750   09/04/17 2315  ceFAZolin (ANCEF) IVPB 2g/100 mL premix     2 g 200 mL/hr over 30 Minutes Intravenous  Once 09/04/17 2303 09/05/17 0805    .  He was given sequential compression devices, early ambulation, and Lovenox for DVT prophylaxis.  He benefited maximally from the hospital stay and there were no complications.    Recent vital signs:  Vitals:   09/05/17 1156 09/05/17 1345  BP: (!) 143/86 (!) 159/78  Pulse: 66 80  Resp:  (!) 22  Temp: 97.6 F (36.4 C) 97.9 F (36.6 C)  SpO2: 98% 99%    Recent laboratory studies:  Lab Results  Component Value Date   HGB 14.9 08/28/2017   Lab Results  Component Value Date   WBC 7.4 08/28/2017   PLT 221 08/28/2017   Lab Results  Component Value Date   INR 1.01 08/28/2017   Lab Results  Component Value Date   NA 143 08/28/2017   K 4.1 08/28/2017   CL 109 08/28/2017   CO2 26 08/28/2017  BUN 17 08/28/2017   CREATININE 0.87 08/28/2017   GLUCOSE 92 08/28/2017    Discharge Medications:   Allergies as of 09/05/2017   No Known Allergies     Medication List    TAKE these medications   enoxaparin 40 MG/0.4ML injection Commonly known as:  LOVENOX Inject 0.4 mLs (40 mg total) into the skin daily.   FLAX SEED OIL PO Take 2 capsules by mouth daily.   MULTI-VITAMINS Tabs Take 1 tablet by mouth daily.   oxyCODONE 5 MG immediate release tablet Commonly known as:  Oxy IR/ROXICODONE Take 1-2 tablets (5-10 mg total) by mouth every 4 (four) hours as needed for breakthrough pain.   psyllium 0.52 g  capsule Commonly known as:  REGULOID Take 4 capsules by mouth 2 (two) times daily.   SUPER B-COMPLEX Caps Take 1 capsule by mouth daily.   traZODone 50 MG tablet Commonly known as:  DESYREL Take 50 mg by mouth at bedtime as needed for sleep.            Durable Medical Equipment        Start     Ordered   09/05/17 1121  DME Walker rolling  Once    Question:  Patient needs a walker to treat with the following condition  Answer:  Status post right partial knee replacement   09/05/17 1120   09/05/17 1121  DME 3 n 1  Once     09/05/17 1120   09/05/17 1121  DME Bedside commode  Once    Question:  Patient needs a bedside commode to treat with the following condition  Answer:  Status post right partial knee replacement   09/05/17 1120      Diagnostic Studies: Dg Chest 2 View  Result Date: 08/28/2017 CLINICAL DATA:  Pre op.  Congested, sob, coughing.  No prev surgery. EXAM: CHEST  2 VIEW COMPARISON:  None. FINDINGS: The cardiac silhouette is normal in size. No mediastinal or hilar masses. No evidence of adenopathy. The lungs are hyperexpanded but clear. No pleural effusion or pneumothorax. Skeletal structures are intact. IMPRESSION: No acute cardiopulmonary disease. Electronically Signed   By: Lajean Manes M.D.   On: 08/28/2017 13:52   Dg Knee Right Port  Result Date: 09/05/2017 CLINICAL DATA:  Status post partial right knee joint replacement. EXAM: PORTABLE RIGHT KNEE - 1-2 VIEW COMPARISON:  None in PACs FINDINGS: The patient is undergone partial right knee joint replacement of the medial compartment. Radiographic positioning of the prosthetic components is good. The interface with the native bone appears normal. There is air in fluid within the joint space. IMPRESSION: No immediate postprocedure complication following partial right knee joint replacement. Electronically Signed   By: Josey  Martinique M.D.   On: 09/05/2017 10:48   Disposition: Plan will be for discharge home on 09/06/17  pending urinating without a foley and progress with PT.  Follow-up Information    Lattie Corns, PA-C Follow up in 14 day(s).   Specialty:  Physician Assistant Why:  Electa Sniff information: [place] [address] [phone]          Signed: Judson Roch PA-C 09/05/2017, 4:00 PM

## 2017-09-05 NOTE — Anesthesia Procedure Notes (Signed)
Spinal  Patient location during procedure: OR Start time: 09/05/2017 7:33 AM End time: 09/05/2017 7:36 AM Staffing Anesthesiologist: Martha Clan Resident/CRNA: [name] Preanesthetic Checklist Completed: patient identified, site marked, surgical consent, pre-op evaluation, timeout performed, IV checked, risks and benefits discussed and monitors and equipment checked Spinal Block Patient position: sitting Prep: Betadine Patient monitoring: heart rate, continuous pulse ox, blood pressure and cardiac monitor Approach: midline Location: L4-5 Injection technique: single-shot Needle Needle type: [place] and Introducer  Needle gauge: 24 G Needle length: 9 cm Assessment Sensory level: T8 Additional Notes Negative paresthesia. Negative blood return. Positive free-flowing CSF. Expiration date of kit checked and confirmed. Patient tolerated procedure well, without complications.

## 2017-09-05 NOTE — Anesthesia Procedure Notes (Signed)
Date/Time: 09/05/2017 7:35 AM Performed by: [name] Pre-anesthesia Checklist: Patient identified, Emergency Drugs available, Suction available, Patient being monitored and Timeout performed Patient Re-evaluated:Patient Re-evaluated prior to induction Oxygen Delivery Method: Simple face mask Preoxygenation: Pre-oxygenation with 100% oxygen

## 2017-09-05 NOTE — Anesthesia Preprocedure Evaluation (Signed)
Anesthesia Evaluation  Patient identified by MRN, date of birth, ID band Patient awake    Reviewed: Allergy & Precautions, H&P , NPO status , Patient's Chart, lab work & pertinent test results, reviewed documented beta blocker date and time   History of Anesthesia Complications Negative for: history of anesthetic complications  Airway Mallampati: I  TM Distance: >3 FB Neck ROM: full    Dental  (+) Dental Advidsory Given, Missing   Pulmonary neg pulmonary ROS, neg shortness of breath, neg sleep apnea, COPD, Recent URI , former smoker,           Cardiovascular Exercise Tolerance: Good negative cardio ROS       Neuro/Psych negative neurological ROS  negative psych ROS   GI/Hepatic Neg liver ROS, GERD  ,  Endo/Other  negative endocrine ROS  Renal/GU negative Renal ROS  negative genitourinary   Musculoskeletal   Abdominal   Peds  Hematology negative hematology ROS (+)   Anesthesia Other Findings Past Medical History: No date: COPD (chronic obstructive pulmonary disease) ([facility])     Comment:  pt said he was told this 10 years ago but has no               inhalers  No date: GERD (gastroesophageal reflux disease)     Comment:  PT TAKES APPLE CIDER VINEGAR    Reproductive/Obstetrics negative OB ROS                             Anesthesia Physical Anesthesia Plan  ASA: II  Anesthesia Plan: Spinal   Post-op Pain Management:    Induction: Intravenous  PONV Risk Score and Plan: 1 and Ondansetron  Airway Management Planned: Natural Airway and Simple Face Mask  Additional Equipment:   Intra-op Plan:   Post-operative Plan:   Informed Consent: I have reviewed the patients History and Physical, chart, labs and discussed the procedure including the risks, benefits and alternatives for the proposed anesthesia with the patient or authorized representative who has indicated his/her  understanding and acceptance.   Dental Advisory Given  Plan Discussed with: Anesthesiologist, CRNA and Surgeon  Anesthesia Plan Comments:         Anesthesia Quick Evaluation

## 2017-09-05 NOTE — Transfer of Care (Signed)
Immediate Anesthesia Transfer of Care Note  Patient: Adrian Hart  Procedure(s) Performed: UNICOMPARTMENTAL KNEE (Right Knee)  Patient Location: PACU  Anesthesia Type:Spinal  Level of Consciousness: awake, alert  and oriented  Airway & Oxygen Therapy: Patient Spontanous Breathing and Patient connected to face mask oxygen  Post-op Assessment: Report given to RN and Post -op Vital signs reviewed and stable  Post vital signs: Reviewed and stable  Last Vitals:  Vitals:   09/05/17 0608 09/05/17 0951  BP: (!) 146/82 125/80  Pulse: 84 73  Resp: 18 15  Temp: (!) 36.2 C (!) 36.2 C  SpO2:  100%    Last Pain:  Vitals:   09/05/17 0951  TempSrc: Temporal  PainSc:          Complications: No apparent anesthesia complications

## 2017-09-05 NOTE — Op Note (Signed)
09/05/2017  9:40 AM  Patient:   Adrian Hart  Pre-Op Diagnosis:   Osteoarthritis of medial compartment, right knee.  Post-Op Diagnosis:   Same  Procedure:   Right unicondylar knee arthroplasty.  Surgeon:   Pascal Lux, MD  Assistant:   Cameron Proud, PA-C  Anesthesia:   Spinal  Findings:   As above.  Complications:   None  EBL:   10 cc  Fluids:   500 cc crystalloid  UOP:   125 cc  TT:   70 minutes at 300 mmHg  Drains:   None  Closure:   Staples  Implants:   All-cemented Biomet Oxford system with a medium femoral component, a "E" sized tibial tray, and a 5 mm meniscal bearing insert.  Brief Clinical Note:   The patient is a 66 year old male with a history of progressively worsening medial sided right knee pain. His symptoms have progressed despite medications, activity modification, injections, etc. His history and examination were consistent with degenerative joint disease, primarily involving the medial compartment, confirmed by plain radiographs and MRI scan. The patient presents at this time for a right partial knee replacement.  Procedure:   The patient was brought into the operating room and a spinal placed by the anesthesiologist. The patient was lain in the supine position and a Foley catheter inserted. The patient was repositioned so that the non-surgical leg was placed in a flexed and abducted position in the yellow fin leg holder while the surgical extremity was placed over the Biomet leg holder. The right lower extremity was prepped with ChloraPrep solution before being draped sterilely. Preoperative antibiotics were administered. After performing a timeout to verify the appropriate surgical site, the limb was exsanguinated with an Esmarch and the tourniquet inflated to 300 mmHg. A standard anterior approach to the knee was made through an approximately 3.5-4 inch incision. The incision was carried down through the subcutaneous tissues to expose the superficial  retinaculum. This was split the length the incision and medial flap elevated sufficiently to expose the medial retinaculum. This was incised along the medial border of the patella tendon and extended proximally along the medial border of the patella, leaving a 3-4 mm cuff of tissue. The soft tissues were elevated off the anteromedial aspect of the proximal tibia. The anterior portion of the meniscus was removed after performing a subtotal excision of the infrapatellar fat pad. The anterior cruciate ligament was inspected and found to be in excellent condition. Osteophytes were removed from the inferior pole of the patella as well as from the notch using a quarter-inch osteotome. There were significant degenerative changes of both the femur and tibia on the medial side. The medial femoral condyle was sized using the small and medium sizers. It was felt that the medium guide best optimized the contour of the femur. This was left in place and the external tibial guide positioned. The coupling device was used to connect the guide to the medial femoral condylar sizer to optimize appropriate orientation. Two guide pins were inserted into the cutting block before the coupling device and sizer were removed. The appropriate tibial cut was made using the oscillating and reciprocating saws. The piece was removed in its entirety and taken to the back table where it was sized and found to be optimally replicated by an "E" sized component. The 9 mm spacer was inserted to verify that sufficient bone had been removed.  Attention was directed to femoral side. The intramedullary canal was accessed through a 4 mm  drill hole. The intramedullary guide was positioned before the guide for the femoral condylar holes was positioned. The appropriate coupling device connected this guide to the intramedullary guide before both drill holes were created in the distal aspect of the medial femoral condyle. The devices were removed and the  posterior condylar cutting block inserted. The appropriate cut was made using the reciprocating saw and this piece removed. The #0 spigot was inserted and the initial bone milling performed. A trial femoral component was inserted and both the flexion and extension gaps measured. In flexion, the gap measured 9 mm whereas in extension, it measured 5 mm. Therefore, the #4 spigot was selected and the secondary bone milling performed. Repeat sizing demonstrated symmetric flexion and extension gaps. The bone was removed from the postero-medial and postero-lateral aspects of the femoral condyle, as well as from the beneath the collar of the spigot. Bone also was removed from the anterior portion of the femur so as to minimize any potential impingement with the meniscal bearing insert. The trial components removed and several drill holes placed into the distal femoral condyle to further augment cement fixation.  Attention was redirected to the tibial side. The "E" sized tibial tray was positioned and temporarily secured using the appropriate spiked nail. The keel was created using the bi-bladed reciprocating saw and hoe. The keeled "E" sized trial tibial tray was inserted to be sure that it seated properly. At this point, a total of 20 cc of Exparel diluted out to 60 cc with normal saline and 30 cc of 0.5% Sensorcaine was injected in and around the posterior and medial capsular tissues, as well as the peri-incisional tissues to help with postoperative pain control.  The bony surfaces were prepared for cementing by irrigating them thoroughly with bacitracin saline solution using the jet lavage system before packing them with a dry Ray-Tec sponge. Meanwhile, cement was being mixed on the back table. When the cement was ready, the tibial tray was cemented in first. The excess cement was removed using a Surveyor, quantity after impacting it into place. Next, the femoral component was impacted into place. Again the excess  cement was removed using a Surveyor, quantity. The 6 mm spacer was inserted and the knee brought into near full extension while the cement hardened. Once the cement hardened, the spacer was removed and the 5 mm meniscal bearing insert was trialed. This demonstrated excellent tracking while the knee was placed through a range of motion, and showed no evidence towards subluxation or dislocation. In addition, it did not fit too tightly. Therefore, the permanent 5 mm meniscal bearing insert was snapped into position after verifying that no cement had been retained posteriorly. Again the knee was placed through a range of motion with the findings as described above.  The wound was copiously irrigated with bacitracin saline solution via the jet lavage system before the retinacular layer was reapproximated using #0 Vicryl interrupted sutures. At this point, 1 g of transexemic acid in 10 cc of normal saline was injected intra-articularly. The subcutaneous tissues were closed in two layers using 2-0 Vicryl interrupted sutures before the skin was closed using staples. A sterile occlusive dressing was applied to the knee before the patient was awakened. The patient was transferred back to his/her hospital bed and returned to the recovery room in satisfactory condition after tolerating the procedure well. A Polar Care device was applied to the knee as well.

## 2017-09-06 LAB — BASIC METABOLIC PANEL
Anion gap: 4 — ABNORMAL LOW (ref 5–15)
BUN: 16 mg/dL (ref 6–20)
CO2: 28 mmol/L (ref 22–32)
Calcium: 8.5 mg/dL — ABNORMAL LOW (ref 8.9–10.3)
Chloride: 110 mmol/L (ref 101–111)
Creatinine, Ser: 0.84 mg/dL (ref 0.61–1.24)
GFR calc Af Amer: >60 mL/min (ref >60)
GFR calc non Af Amer: >60 mL/min (ref >60)
Glucose, Bld: 127 mg/dL — ABNORMAL HIGH (ref 65–99)
Potassium: 3.9 mmol/L (ref 3.5–5.1)
Sodium: 142 mmol/L (ref 135–145)

## 2017-09-06 LAB — CBC WITH DIFFERENTIAL/PLATELET
Basophils Absolute: 0.1 10*3/uL (ref 0–0.1)
Basophils Relative: 1 %
Eosinophils Absolute: 0.2 10*3/uL (ref 0–0.7)
Eosinophils Relative: 3 %
HCT: 38.1 % — ABNORMAL LOW (ref 40.0–52.0)
Hemoglobin: 13.3 g/dL (ref 13.0–18.0)
Lymphocytes Relative: 17 %
Lymphs Abs: 1.3 10*3/uL (ref 1.0–3.6)
MCH: 29.9 pg (ref 26.0–34.0)
MCHC: 34.8 g/dL (ref 32.0–36.0)
MCV: 85.9 fL (ref 80.0–100.0)
Monocytes Absolute: 0.8 10*3/uL (ref 0.2–1.0)
Monocytes Relative: 10 %
Neutro Abs: 5.5 10*3/uL (ref 1.4–6.5)
Neutrophils Relative %: 69 %
Platelets: 173 10*3/uL (ref 150–440)
RBC: 4.44 MIL/uL (ref 4.40–5.90)
RDW: 13.7 % (ref 11.5–14.5)
WBC: 7.9 10*3/uL (ref 3.8–10.6)

## 2017-09-06 NOTE — Anesthesia Postprocedure Evaluation (Signed)
Anesthesia Post Note  Patient: Adrian Hart  Procedure(s) Performed: UNICOMPARTMENTAL KNEE (Right Knee)  Patient location during evaluation: Nursing Unit Anesthesia Type: Spinal Level of consciousness: oriented and awake and alert Pain management: pain level controlled Vital Signs Assessment: post-procedure vital signs reviewed and stable Respiratory status: spontaneous breathing and respiratory function stable Cardiovascular status: stable Postop Assessment: no headache, no backache and no apparent nausea or vomiting Anesthetic complications: no     Last Vitals:  Vitals:   09/05/17 2000 09/05/17 2337  BP: 128/82 128/79  Pulse: 72 82  Resp: 20   Temp: (!) 36.1 C 36.5 C  SpO2: 98% 98%    Last Pain:  Vitals:   09/06/17 0320  TempSrc:   PainSc: Haze Rushing A

## 2017-09-06 NOTE — Care Management Note (Signed)
Case Management Note  Patient Details  Name: Harsh Trulock MRN: [id number] Date of Birth: 01/31/1951  Subjective/Objective:  Cost of Lovenox is $ 85.00.Patient denies issues paying for medication. Notified him of OP PT time.                  Action/Plan:   Expected Discharge Date:  09/06/17               Expected Discharge Plan:  OP Rehab  In-House Referral:     Discharge planning Services  CM Consult  Post Acute Care Choice:  Durable Medical Equipment Choice offered to:  Patient  DME Arranged:  Gilford Rile rolling DME Agency:  [place]:    [place]:     Status of Service:  Completed, signed off  If discussed at [place] of Stay Meetings, dates discussed:    Additional Comments:  Jolly Mango, RN 09/06/2017, 11:35 AM

## 2017-09-06 NOTE — Care Management Note (Signed)
Case Management Note  Patient Details  Name: Mahad Newstrom MRN: [id number] Date of Birth: 1951-02-28  Subjective/Objective:  Met with patient at bedside. He is agreeable. Plans to stay with his daughter over the weekend and then go to his home on Monday.                 Action/Plan: OP rehab appointment Monday, Oct. 8 at 2 pm. Called Lovenox 40 mg # 14 , no refills to [facility] [phone]. Walker ordered from [place] with [place].   Expected Discharge Date:  09/06/17               Expected Discharge Plan:  OP Rehab  In-House Referral:     Discharge planning Services  CM Consult  Post Acute Care Choice:  Durable Medical Equipment Choice offered to:  Patient  DME Arranged:  Gilford Rile rolling DME Agency:  [place]:    [place]:     Status of Service:  Completed, signed off  If discussed at [place] of Stay Meetings, dates discussed:    Additional Comments:  Jolly Mango, RN 09/06/2017, 10:03 AM

## 2017-09-06 NOTE — Progress Notes (Signed)
Clinical Education officer, museum (CSW) received SNF consult. PT is recommending outpatient PT vs. Home health. Patient will D/C home today. RN case manager aware of above. Please reconsult if future social work needs arise. CSW signing off.   [employer], LCSW [phone]

## 2017-09-06 NOTE — Progress Notes (Signed)
  Subjective: 1 Day Post-Op Procedure(s) (LRB): UNICOMPARTMENTAL KNEE (Right) Patient reports pain as mild.   Patient seen in rounds with Dr. Roland Rack. Patient is well, and has had no acute complaints or problems Plan is to go Home after hospital stay. Negative for chest pain and shortness of breath Fever: no Gastrointestinal: Negative for nausea and vomiting  Objective: Vital signs in last 24 hours: Temp:  [97 F (36.1 C)-97.9 F (36.6 C)] 97.7 F (36.5 C) (10/04 2337) Pulse Rate:  [66-82] 82 (10/04 2337) Resp:  [9-22] 20 (10/04 2000) BP: (125-159)/(75-90) 128/79 (10/04 2337) SpO2:  [98 %-100 %] 98 % (10/04 2337)  Intake/Output from previous day:  Intake/Output Summary (Last 24 hours) at 09/06/17 0657 Last data filed at 09/06/17 0431  Gross per 24 hour  Intake             2775 ml  Output             3135 ml  Net             -360 ml    Intake/Output this shift: Total I/O In: 1735 [P.O.:240; I.V.:1495] Out: 1800 [Urine:1800]  Labs:  Recent Labs  09/06/17 0355  HGB 13.3    Recent Labs  09/06/17 0355  WBC 7.9  RBC 4.44  HCT 38.1*  PLT 173    Recent Labs  09/06/17 0355  NA 142  K 3.9  CL 110  CO2 28  BUN 16  CREATININE 0.84  GLUCOSE 127*  CALCIUM 8.5*   No results for input(s): LABPT, INR in the last 72 hours.   EXAM General - Patient is Alert and Oriented Extremity - Sensation intact distally Dorsiflexion/Plantar flexion intact No cellulitis present Compartment soft Dressing/Incision - clean, frank blood drainage. A pressure dressing was applied to the wound. Motor Function - intact, moving foot and toes well on exam.   Past Medical History:  Diagnosis Date  . COPD (chronic obstructive pulmonary disease) ([place])    pt said he was told this 10 years ago but has no inhalers   . GERD (gastroesophageal reflux disease)    PT TAKES APPLE CIDER VINEGAR     Assessment/Plan: 1 Day Post-Op Procedure(s) (LRB): UNICOMPARTMENTAL KNEE (Right) Active  Problems:   Status post right partial knee replacement  Estimated body mass index is 31.24 kg/m as calculated from the following:   Height as of this encounter: 5\' 11"  (1.803 m).   Weight as of this encounter: 101.6 kg (224 lb). Advance diet Up with therapy D/C IV fluids Discharge home with home health after physical therapy. Dressing change before discharge if bleeding is controlled.  DVT Prophylaxis - Lovenox, Foot Pumps and TED hose Weight-Bearing as tolerated to right leg  Reche Dixon, PA-C Orthopaedic Surgery 09/06/2017, 6:57 AM

## 2017-09-06 NOTE — Progress Notes (Signed)
Physical Therapy Treatment Patient Details Name: Adrian Hart MRN: [id number] DOB: 14-Feb-1951 Today's Date: 09/06/2017    History of Present Illness 66 y/o male here s/p R partial knee replacement 09/05/17.    PT Comments    Pt continues to do very well with PT, he was able to easily do 2 loops around the nurses station and go up/down 4 steps X 2.  He continues to have good strength, ROM and overall confidence with all exercises, functional tasks and required only minimal cuing and reinforcement with all activities.     Follow Up Recommendations  Outpatient PT     Equipment Recommendations  Rolling walker with 5" wheels    Recommendations for Other Services       Precautions / Restrictions Precautions Precautions: Knee Restrictions RLE Weight Bearing: Weight bearing as tolerated    Mobility  Bed Mobility Overal bed mobility: Independent             General bed mobility comments: Pt easily gets up to EOB w/o assist  Transfers Overall transfer level: Independent Equipment used: Rolling walker (2 wheeled)             General transfer comment: again able to rise w/o hesitation, minimal need for AD  Ambulation/Gait Ambulation/Gait assistance: Supervision Ambulation Distance (Feet): 400 Feet Assistive device: Rolling walker (2 wheeled)       General Gait Details: Pt again did very well with ambulation showing only brief initial stiffness and then walking confidently at community appropriate speed with only minimal UE use.  He was able to easily walk ~25 ft while using only single UE on hallway hand rail.     Stairs Stairs: Yes   Stair Management: No rails;One rail Right;Sideways;Backwards Number of Stairs: 4 General stair comments: Pt did 4 steps using single rail sideways strategy (daughter's home) and 4 steps with walker going backward (pt's home)  Wheelchair Mobility    Modified Rankin (Stroke Patients Only)       Balance Overall balance  assessment: Independent                                          Cognition Arousal/Alertness: Awake/alert Behavior During Therapy: WFL for tasks assessed/performed Overall Cognitive Status: Within Functional Limits for tasks assessed                                        Exercises Total Joint Exercises Ankle Circles/Pumps: AROM;10 reps Quad Sets: Strengthening;15 reps Gluteal Sets: Strengthening;15 reps Heel Slides: Strengthening;10 reps Hip ABduction/ADduction: Strengthening;10 reps Straight Leg Raises: Strengthening;10 reps Long Arc Quad: Strengthening;10 reps Knee Flexion: 5 reps;AAROM Goniometric ROM: 0-115    General Comments        Pertinent Vitals/Pain Pain Assessment: 0-10 Pain Score: 2     Home Living                      Prior Function            PT Goals (current goals can now be found in the care plan section) Progress towards PT goals: Progressing toward goals    Frequency    BID      PT Plan Current plan remains appropriate    Co-evaluation  AM-PAC PT "6 Clicks" Daily Activity  Outcome Measure  Difficulty turning over in bed (including adjusting bedclothes, sheets and blankets)?: None Difficulty moving from lying on back to sitting on the side of the bed? : None Difficulty sitting down on and standing up from a chair with arms (e.g., wheelchair, bedside commode, etc,.)?: None Help needed moving to and from a bed to chair (including a wheelchair)?: None Help needed walking in hospital room?: None Help needed climbing 3-5 steps with a railing? : None 6 Click Score: 24    End of Session Equipment Utilized During Treatment: Gait belt Activity Tolerance: Patient tolerated treatment well Patient left: with chair alarm set;with call bell/phone within reach;with nursing/sitter in room;with family/visitor present   PT Visit Diagnosis: Muscle weakness (generalized) (M62.81);Difficulty  in walking, not elsewhere classified (R26.2)     Time: 5449-2010 PT Time Calculation (min) (ACUTE ONLY): 40 min  Charges:  $Gait Training: 8-22 mins $Therapeutic Exercise: 23-37 mins                    G Codes:       Kreg Shropshire, DPT 09/06/2017, 12:01 PM

## 2017-09-06 NOTE — Progress Notes (Signed)
Pt ready for discharge home with family. Reviewed all discharge instructions/followup appts and prescription info. Pt understands these instructions. Daughter at bedside.

## 2017-09-09 DIAGNOSIS — M25561 Pain in right knee: Secondary | ICD-10-CM | POA: Diagnosis not present

## 2017-09-09 DIAGNOSIS — M6281 Muscle weakness (generalized): Secondary | ICD-10-CM | POA: Diagnosis not present

## 2017-09-09 DIAGNOSIS — M25661 Stiffness of right knee, not elsewhere classified: Secondary | ICD-10-CM | POA: Diagnosis not present

## 2017-09-09 DIAGNOSIS — Z96651 Presence of right artificial knee joint: Secondary | ICD-10-CM | POA: Diagnosis not present

## 2017-09-11 DIAGNOSIS — Z96651 Presence of right artificial knee joint: Secondary | ICD-10-CM | POA: Diagnosis not present

## 2017-09-13 DIAGNOSIS — Z96651 Presence of right artificial knee joint: Secondary | ICD-10-CM | POA: Diagnosis not present

## 2017-09-16 DIAGNOSIS — M25561 Pain in right knee: Secondary | ICD-10-CM | POA: Diagnosis not present

## 2017-09-16 DIAGNOSIS — Z96651 Presence of right artificial knee joint: Secondary | ICD-10-CM | POA: Diagnosis not present

## 2017-09-18 DIAGNOSIS — Z96651 Presence of right artificial knee joint: Secondary | ICD-10-CM | POA: Diagnosis not present

## 2017-09-20 DIAGNOSIS — Z96651 Presence of right artificial knee joint: Secondary | ICD-10-CM | POA: Diagnosis not present

## 2017-09-23 ENCOUNTER — Encounter: Admission: RE | Payer: Self-pay | Source: Ambulatory Visit

## 2017-09-23 ENCOUNTER — Ambulatory Visit: Admission: RE | Admit: 2017-09-23 | Payer: PPO | Source: Ambulatory Visit | Admitting: Unknown Physician Specialty

## 2017-09-23 DIAGNOSIS — Z96651 Presence of right artificial knee joint: Secondary | ICD-10-CM | POA: Diagnosis not present

## 2017-09-23 SURGERY — COLONOSCOPY WITH PROPOFOL
Anesthesia: General

## 2017-09-25 DIAGNOSIS — M25561 Pain in right knee: Secondary | ICD-10-CM | POA: Diagnosis not present

## 2017-09-27 DIAGNOSIS — Z96651 Presence of right artificial knee joint: Secondary | ICD-10-CM | POA: Diagnosis not present

## 2017-10-01 DIAGNOSIS — Z96651 Presence of right artificial knee joint: Secondary | ICD-10-CM | POA: Diagnosis not present

## 2017-10-01 DIAGNOSIS — M6281 Muscle weakness (generalized): Secondary | ICD-10-CM | POA: Diagnosis not present

## 2017-10-03 DIAGNOSIS — Z96651 Presence of right artificial knee joint: Secondary | ICD-10-CM | POA: Diagnosis not present

## 2017-10-03 DIAGNOSIS — M6281 Muscle weakness (generalized): Secondary | ICD-10-CM | POA: Diagnosis not present

## 2017-10-08 DIAGNOSIS — Z96651 Presence of right artificial knee joint: Secondary | ICD-10-CM | POA: Diagnosis not present

## 2017-10-10 DIAGNOSIS — Z96651 Presence of right artificial knee joint: Secondary | ICD-10-CM | POA: Diagnosis not present

## 2017-10-15 DIAGNOSIS — Z96651 Presence of right artificial knee joint: Secondary | ICD-10-CM | POA: Diagnosis not present

## 2017-10-17 DIAGNOSIS — Z96651 Presence of right artificial knee joint: Secondary | ICD-10-CM | POA: Diagnosis not present

## 2017-10-18 DIAGNOSIS — M1732 Unilateral post-traumatic osteoarthritis, left knee: Secondary | ICD-10-CM | POA: Diagnosis not present

## 2017-10-18 DIAGNOSIS — Z96651 Presence of right artificial knee joint: Secondary | ICD-10-CM | POA: Diagnosis not present

## 2018-01-01 DIAGNOSIS — R7309 Other abnormal glucose: Secondary | ICD-10-CM | POA: Diagnosis not present

## 2018-01-01 DIAGNOSIS — Z0000 Encounter for general adult medical examination without abnormal findings: Secondary | ICD-10-CM | POA: Diagnosis not present

## 2018-02-28 DIAGNOSIS — Z96651 Presence of right artificial knee joint: Secondary | ICD-10-CM | POA: Diagnosis not present

## 2018-02-28 DIAGNOSIS — M1732 Unilateral post-traumatic osteoarthritis, left knee: Secondary | ICD-10-CM | POA: Diagnosis not present

## 2018-05-02 ENCOUNTER — Encounter: Payer: Self-pay | Admitting: *Deleted

## 2018-05-02 ENCOUNTER — Ambulatory Visit: Payer: PPO | Admitting: Anesthesiology

## 2018-05-02 ENCOUNTER — Ambulatory Visit
Admission: RE | Admit: 2018-05-02 | Discharge: 2018-05-02 | Disposition: A | Discharge status: Home / Self Care | Payer: PPO | Source: Ambulatory Visit | Attending: Unknown Physician Specialty | Admitting: Unknown Physician Specialty

## 2018-05-02 ENCOUNTER — Other Ambulatory Visit: Payer: Self-pay

## 2018-05-02 ENCOUNTER — Encounter
Admission: RE | Disposition: A | Discharge status: Home / Self Care | Payer: Self-pay | Source: Ambulatory Visit | Attending: Unknown Physician Specialty

## 2018-05-02 DIAGNOSIS — K5730 Diverticulosis of large intestine without perforation or abscess without bleeding: Secondary | ICD-10-CM | POA: Diagnosis not present

## 2018-05-02 DIAGNOSIS — K5790 Diverticulosis of intestine, part unspecified, without perforation or abscess without bleeding: Secondary | ICD-10-CM | POA: Diagnosis not present

## 2018-05-02 DIAGNOSIS — Z79899 Other long term (current) drug therapy: Secondary | ICD-10-CM | POA: Insufficient documentation

## 2018-05-02 DIAGNOSIS — K640 First degree hemorrhoids: Secondary | ICD-10-CM | POA: Diagnosis not present

## 2018-05-02 DIAGNOSIS — N402 Nodular prostate without lower urinary tract symptoms: Secondary | ICD-10-CM | POA: Insufficient documentation

## 2018-05-02 DIAGNOSIS — Z87891 Personal history of nicotine dependence: Secondary | ICD-10-CM | POA: Diagnosis not present

## 2018-05-02 DIAGNOSIS — D122 Benign neoplasm of ascending colon: Secondary | ICD-10-CM | POA: Diagnosis not present

## 2018-05-02 DIAGNOSIS — K219 Gastro-esophageal reflux disease without esophagitis: Secondary | ICD-10-CM | POA: Diagnosis not present

## 2018-05-02 DIAGNOSIS — Z1211 Encounter for screening for malignant neoplasm of colon: Secondary | ICD-10-CM | POA: Diagnosis not present

## 2018-05-02 DIAGNOSIS — K635 Polyp of colon: Secondary | ICD-10-CM | POA: Diagnosis not present

## 2018-05-02 DIAGNOSIS — K648 Other hemorrhoids: Secondary | ICD-10-CM | POA: Diagnosis not present

## 2018-05-02 HISTORY — PX: COLONOSCOPY WITH PROPOFOL: SHX5780

## 2018-05-02 SURGERY — COLONOSCOPY WITH PROPOFOL
Anesthesia: General

## 2018-05-02 MED ORDER — PIPERACILLIN-TAZOBACTAM 3.375 G IVPB
INTRAVENOUS | Status: AC
  Administered 2018-05-02: 3.375 g via INTRAVENOUS
  Filled 2018-05-02: qty 50

## 2018-05-02 MED ORDER — PROPOFOL 10 MG/ML IV BOLUS
INTRAVENOUS | Status: DC | PRN
  Administered 2018-05-02: 30 mg via INTRAVENOUS
  Administered 2018-05-02: 20 mg via INTRAVENOUS

## 2018-05-02 MED ORDER — LIDOCAINE HCL (PF) 2 % IJ SOLN
INTRAMUSCULAR | Status: DC | PRN
  Administered 2018-05-02: 80 mg

## 2018-05-02 MED ORDER — PROPOFOL 500 MG/50ML IV EMUL
INTRAVENOUS | Status: DC | PRN
  Administered 2018-05-02: 75 ug/kg/min via INTRAVENOUS

## 2018-05-02 MED ORDER — FENTANYL CITRATE (PF) 100 MCG/2ML IJ SOLN
INTRAMUSCULAR | Status: AC
  Filled 2018-05-02: qty 2

## 2018-05-02 MED ORDER — SODIUM CHLORIDE 0.9 % IV SOLN
INTRAVENOUS | Status: DC
  Administered 2018-05-02: 08:00:00 via INTRAVENOUS

## 2018-05-02 MED ORDER — PIPERACILLIN-TAZOBACTAM 3.375 G IVPB 30 MIN
3.3750 g | Freq: Once | INTRAVENOUS | Status: AC
Start: 2018-05-02 — End: 2018-05-02
  Administered 2018-05-02: 3.375 g via INTRAVENOUS
  Filled 2018-05-02: qty 50

## 2018-05-02 MED ORDER — MIDAZOLAM HCL 5 MG/5ML IJ SOLN
INTRAMUSCULAR | Status: DC | PRN
  Administered 2018-05-02: 2 mg via INTRAVENOUS

## 2018-05-02 MED ORDER — SODIUM CHLORIDE 0.9 % IV SOLN: INTRAVENOUS | Status: DC

## 2018-05-02 MED ORDER — FENTANYL CITRATE (PF) 100 MCG/2ML IJ SOLN
INTRAMUSCULAR | Status: DC | PRN
  Administered 2018-05-02: 50 ug via INTRAVENOUS
  Administered 2018-05-02 (×2): 25 ug via INTRAVENOUS

## 2018-05-02 MED ORDER — LIDOCAINE HCL (PF) 2 % IJ SOLN
INTRAMUSCULAR | Status: AC
  Filled 2018-05-02: qty 10

## 2018-05-02 MED ORDER — MIDAZOLAM HCL 2 MG/2ML IJ SOLN
INTRAMUSCULAR | Status: AC
  Filled 2018-05-02: qty 2

## 2018-05-02 NOTE — Anesthesia Post-op Follow-up Note (Signed)
Anesthesia QCDR form completed.        

## 2018-05-02 NOTE — Anesthesia Preprocedure Evaluation (Signed)
Anesthesia Evaluation  Patient identified by MRN, date of birth, ID band Patient awake    Reviewed: Allergy & Precautions, H&P , NPO status , Patient's Chart, lab work & pertinent test results  History of Anesthesia Complications Negative for: history of anesthetic complications  Airway Mallampati: III  TM Distance: <3 FB Neck ROM: limited    Dental  (+) Chipped, Poor Dentition, Missing   Pulmonary neg shortness of breath, former smoker,           Cardiovascular Exercise Tolerance: Good (-) angina(-) Past MI and (-) DOE negative cardio ROS       Neuro/Psych negative neurological ROS  negative psych ROS   GI/Hepatic Neg liver ROS, GERD  Medicated and Controlled,  Endo/Other  negative endocrine ROS  Renal/GU negative Renal ROS  negative genitourinary   Musculoskeletal   Abdominal   Peds  Hematology negative hematology ROS (+)   Anesthesia Other Findings Past Medical History: No date: GERD (gastroesophageal reflux disease)     Comment:  PT TAKES APPLE CIDER VINEGAR   Past Surgical History: 2003: COLONOSCOPY     Comment:  Dr Alveta Heimlich No date: EYE SURGERY; Right     Comment:  cataract 1996: FLEXIBLE SIGMOIDOSCOPY     Comment:  Dr Bary Castilla 04-20-04: HEMORRHOID SURGERY     Comment:  Dr Bary Castilla 09/27/2015: HERNIA REPAIR     Comment:  Umbilical, primary repair No date: KNEE SURGERY; Left     Comment:  arthroscopy 09/05/2017: PARTIAL KNEE ARTHROPLASTY; Right     Comment:  Procedure: UNICOMPARTMENTAL KNEE;  Surgeon: Corky Mull, MD;  Location: [facility] ORS;  Service: Orthopedics;                Laterality: Right; No date: TONSILLECTOMY 99/37/1696: UMBILICAL HERNIA REPAIR; N/A     Comment:  Procedure: HERNIA REPAIR UMBILICAL ADULT;  Surgeon:               Robert Bellow, MD;  Location: [facility] ORS;  Service:               General;  Laterality: N/A;  BMI    Body Mass Index:  29.57 kg/m      Reproductive/Obstetrics negative OB ROS                             Anesthesia Physical Anesthesia Plan  ASA: III  Anesthesia Plan: General   Post-op Pain Management:    Induction: Intravenous  PONV Risk Score and Plan: Propofol infusion and TIVA  Airway Management Planned: Natural Airway and Nasal Cannula  Additional Equipment:   Intra-op Plan:   Post-operative Plan:   Informed Consent: I have reviewed the patients History and Physical, chart, labs and discussed the procedure including the risks, benefits and alternatives for the proposed anesthesia with the patient or authorized representative who has indicated his/her understanding and acceptance.   Dental Advisory Given  Plan Discussed with: Anesthesiologist, CRNA and Surgeon  Anesthesia Plan Comments: (Patient consented for risks of anesthesia including but not limited to:  - adverse reactions to medications - risk of intubation if required - damage to teeth, lips or other oral mucosa - sore throat or hoarseness - Damage to heart, brain, lungs or loss of life  Patient voiced understanding.)        Anesthesia Quick Evaluation

## 2018-05-02 NOTE — Op Note (Signed)
[facility] Patient Name: Adrian Hart Procedure Date: 05/02/2018 8:27 AM MRN: [id number] Account #: [id number] Date of Birth: [birth year] Admit Type: Outpatient Age: 67 Room: [facility] ENDO ROOM 1 Gender: Male Note Status: Finalized Procedure:            Colonoscopy Indications:          Screening for colorectal malignant neoplasm Providers:            Manya Silvas, MD Referring MD:         Ocie Cornfield. Ouida Sills MD, MD (Referring MD) Medicines:            Propofol per Anesthesia Complications:        No immediate complications. Procedure:            Pre-Anesthesia Assessment:                       - After reviewing the risks and benefits, the patient                        was deemed in satisfactory condition to undergo the                        procedure.                       After obtaining informed consent, the colonoscope was                        passed under direct vision. Throughout the procedure,                        the patient's blood pressure, pulse, and oxygen                        saturations were monitored continuously. The                        Colonoscope was introduced through the anus and                        advanced to the the cecum, identified by appendiceal                        orifice and ileocecal valve. The colonoscopy was                        performed without difficulty. The patient tolerated the                        procedure well. The quality of the bowel preparation                        was excellent. Findings:      THERE WAS A NODULE IN THE DISTAL PROSTATE SEE UROLOGIST>      A large polyp was found in the proximal ascending colon. The polyp was       semi-pedunculated. The polyp was removed with a hot snare. Resection and       retrieval were complete. To prevent bleeding after the polypectomy, one       hemostatic clip was successfully placed. There was no  bleeding during,       or at the end, of  the procedure.      A diminutive polyp was found in the ascending colon. The polyp was       sessile. The polyp was removed with a jumbo cold forceps. Resection and       retrieval were complete.      A diminutive polyp was found in the ascending colon. The polyp was       sessile. The polyp was removed with a jumbo cold forceps. Resection and       retrieval were complete.      Multiple small and large-mouthed diverticula were found in the sigmoid       colon, descending colon, transverse colon and ascending colon.      Internal hemorrhoids were found during endoscopy. The hemorrhoids were       small and Grade I (internal hemorrhoids that do not prolapse). Impression:           - One large polyp in the proximal ascending colon,                        removed with a hot snare. Resected and retrieved. Clip                        was placed.                       - One diminutive polyp in the ascending colon, removed                        with a jumbo cold forceps. Resected and retrieved.                       - One diminutive polyp in the ascending colon, removed                        with a jumbo cold forceps. Resected and retrieved.                       - Diverticulosis in the sigmoid colon, in the                        descending colon, in the transverse colon and in the                        ascending colon.                       - Internal hemorrhoids. Recommendation:       - Await pathology results.                       - The findings and recommendations were discussed with                        the patient's family. Manya Silvas, MD 05/02/2018 9:07:47 AM This report has been signed electronically. Number of Addenda: 0 Note Initiated On: 05/02/2018 8:27 AM Scope Withdrawal Time: 0 hours 24 minutes 0 seconds  Total Procedure Duration: 0 hours 29 minutes 11 seconds       [facility]

## 2018-05-02 NOTE — H&P (Signed)
Primary Care Physician:  Kirk Ruths, MD Primary Gastroenterologist:  Dr. Vira Agar  Pre-Procedure History & Physical: HPI:  Adrian Hart is a 67 y.o. male is here for an colonoscopy.To be done for screening.   Past Medical History:  Diagnosis Date  . GERD (gastroesophageal reflux disease)    PT TAKES APPLE CIDER VINEGAR     Past Surgical History:  Procedure Laterality Date  . COLONOSCOPY  2003   Dr Alveta Heimlich  . EYE SURGERY Right    cataract  . FLEXIBLE SIGMOIDOSCOPY  1996   Dr Bary Castilla  . HEMORRHOID SURGERY  04-20-04   Dr Bary Castilla  . HERNIA REPAIR  [id number]/[id number]   Umbilical, primary repair  . KNEE SURGERY Left    arthroscopy  . PARTIAL KNEE ARTHROPLASTY Right 09/05/2017   Procedure: UNICOMPARTMENTAL KNEE;  Surgeon: Corky Mull, MD;  Location: [facility] ORS;  Service: Orthopedics;  Laterality: Right;  . TONSILLECTOMY    . UMBILICAL HERNIA REPAIR N/A 09/27/2015   Procedure: HERNIA REPAIR UMBILICAL ADULT;  Surgeon: Robert Bellow, MD;  Location: [facility] ORS;  Service: General;  Laterality: N/A;    Prior to Admission medications   Medication Sig Start Date End Date Taking? Authorizing Provider  B Complex-Biotin-FA (SUPER B-COMPLEX) CAPS Take 1 capsule by mouth daily.   Yes [provider]  Flaxseed, Linseed, (FLAX SEED OIL PO) Take 2 capsules by mouth daily.   Yes [provider]  meloxicam (MOBIC) 15 MG tablet Take 15 mg by mouth daily.   Yes [provider]  Multiple Vitamin (MULTI-VITAMINS) TABS Take 1 tablet by mouth daily.    Yes [provider]  traZODone (DESYREL) 50 MG tablet Take 50 mg by mouth at bedtime as needed for sleep.   Yes [provider]  Turmeric 500 MG CAPS Take 500 mg by mouth daily.   Yes [provider]  vitamin B-12 (CYANOCOBALAMIN) 500 MCG tablet Take 1,000 mcg by mouth every other day.   Yes [provider]  enoxaparin (LOVENOX) 40 MG/0.4ML injection Inject 0.4 mLs (40 mg total) into the skin  daily. Patient not taking: Reported on 05/02/2018 09/06/17   Lattie Corns, PA-C  oxyCODONE (OXY IR/ROXICODONE) 5 MG immediate release tablet Take 1-2 tablets (5-10 mg total) by mouth every 4 (four) hours as needed for breakthrough pain. Patient not taking: Reported on 05/02/2018 09/05/17   Lattie Corns, PA-C  psyllium (REGULOID) 0.52 g capsule Take 4 capsules by mouth 2 (two) times daily.    [provider]    Allergies as of 03/26/2018  . (No Known Allergies)    Family History  Problem Relation Age of Onset  . Diabetes Mother     Social History   Socioeconomic History  . Marital status: Widowed    Spouse name: Not on file  . Number of children: Not on file  . Years of education: Not on file  . Highest education level: Not on file  Occupational History  . Not on file  Social Needs  . Financial resource strain: Not on file  . Food insecurity:    Worry: Not on file    Inability: Not on file  . Transportation needs:    Medical: Not on file    Non-medical: Not on file  Tobacco Use  . Smoking status: Former Smoker    Packs/day: 1.00    Years: 40.00    Pack years: 40.00    Types: Cigarettes    Last attempt to quit: 09/18/2010  Years since quitting: 7.6  . Smokeless tobacco: Never Used  Substance and Sexual Activity  . Alcohol use: No    Alcohol/week: 0.0 oz  . Drug use: No  . Sexual activity: Not on file  Lifestyle  . Physical activity:    Days per week: Not on file    Minutes per session: Not on file  . Stress: Not on file  Relationships  . Social connections:    Talks on phone: Not on file    Gets together: Not on file    Attends religious service: Not on file    Active member of club or organization: Not on file    Attends meetings of clubs or organizations: Not on file    Relationship status: Not on file  . Intimate partner violence:    Fear of current or ex partner: Not on file    Emotionally abused: Not on file    Physically abused:  Not on file    Forced sexual activity: Not on file  Other Topics Concern  . Not on file  Social History Narrative  . Not on file    Review of Systems: See HPI, otherwise negative ROS  Physical Exam: BP (!) 138/92   Pulse 82   Temp (!) 97.5 F (36.4 C) (Tympanic)   Resp 16   Ht 5\' 11"  (1.803 m)   Wt 96.2 kg (212 lb)   SpO2 99%   BMI 29.57 kg/m  General:   Alert,  pleasant and cooperative in NAD Head:  Normocephalic and atraumatic. Neck:  Supple; no masses or thyromegaly. Lungs:  Clear throughout to auscultation.    Heart:  Regular rate and rhythm. Abdomen:  Soft, nontender and nondistended. Normal bowel sounds, without guarding, and without rebound.   Neurologic:  Alert and  oriented x4;  grossly normal neurologically.  Impression/Plan: Adrian Hart is here for an colonoscopy to be performed for screening  Risks, benefits, limitations, and alternatives regarding  colonoscopy have been reviewed with the patient.  Questions have been answered.  All parties agreeable.   Gaylyn Cheers, MD  05/02/2018, 8:21 AM

## 2018-05-02 NOTE — Anesthesia Postprocedure Evaluation (Signed)
Anesthesia Post Note  Patient: Adrian Hart  Procedure(s) Performed: COLONOSCOPY WITH PROPOFOL (N/A )  Patient location during evaluation: Endoscopy Anesthesia Type: General Level of consciousness: awake and alert Pain management: pain level controlled Vital Signs Assessment: post-procedure vital signs reviewed and stable Respiratory status: spontaneous breathing, nonlabored ventilation, respiratory function stable and patient connected to nasal cannula oxygen Cardiovascular status: blood pressure returned to baseline and stable Postop Assessment: no apparent nausea or vomiting Anesthetic complications: no     Last Vitals:  Vitals:   05/02/18 0905 05/02/18 0925  BP:  (!) 158/84  Pulse:    Resp:    Temp: (!) 36.1 C   SpO2:      Last Pain:  Vitals:   05/02/18 0905  TempSrc: Tympanic  PainSc:                  Precious Haws Piscitello

## 2018-05-02 NOTE — Transfer of Care (Signed)
Immediate Anesthesia Transfer of Care Note  Patient: Adrian Hart  Procedure(s) Performed: COLONOSCOPY WITH PROPOFOL (N/A )  Patient Location: PACU  Anesthesia Type:General  Level of Consciousness: sedated  Airway & Oxygen Therapy: Patient Spontanous Breathing and Patient connected to nasal cannula oxygen  Post-op Assessment: Report given to RN and Post -op Vital signs reviewed and stable  Post vital signs: Reviewed and stable  Last Vitals:  Vitals Value Taken Time  BP 115/80 05/02/2018  9:05 AM  Temp    Pulse 78 05/02/2018  9:06 AM  Resp 13 05/02/2018  9:06 AM  SpO2 100 % 05/02/2018  9:06 AM  Vitals shown include unvalidated device data.  Last Pain:  Vitals:   05/02/18 0805  TempSrc: Tympanic  PainSc: 0-No pain         Complications: No apparent anesthesia complications

## 2018-05-05 ENCOUNTER — Encounter: Payer: Self-pay | Admitting: Unknown Physician Specialty

## 2018-05-05 LAB — SURGICAL PATHOLOGY

## 2018-05-29 ENCOUNTER — Encounter

## 2018-05-29 ENCOUNTER — Encounter: Payer: Self-pay | Admitting: Urology

## 2018-05-29 ENCOUNTER — Ambulatory Visit: Payer: PPO | Admitting: Urology

## 2018-05-29 VITALS — BP 128/77 | HR 72 | Ht 71.0 in | Wt 219.2 lb

## 2018-05-29 DIAGNOSIS — N402 Nodular prostate without lower urinary tract symptoms: Secondary | ICD-10-CM

## 2018-05-29 DIAGNOSIS — R350 Frequency of micturition: Secondary | ICD-10-CM | POA: Diagnosis not present

## 2018-05-29 DIAGNOSIS — Z125 Encounter for screening for malignant neoplasm of prostate: Secondary | ICD-10-CM

## 2018-05-29 NOTE — Progress Notes (Signed)
05/29/2018 1:54 PM   Adrian Hart [birth year] [id number]  Referring provider: Kirk Ruths, MD [place] [facility] [place], [name] [address]  No chief complaint on file.   HPI:  67 yo referred for prostate nodule. Patient underwent colonoscopy May 02, 2018 and GI noted nodule on "distal" prostate. His PSA was 1.92 Oct 2017. No FH of PCa. No h/o BPH. He has occasional weak stream. He has no frequency.    PMH: Past Medical History:  Diagnosis Date  . GERD (gastroesophageal reflux disease)    PT TAKES APPLE CIDER VINEGAR     Surgical History: Past Surgical History:  Procedure Laterality Date  . COLONOSCOPY  2003   Dr Alveta Heimlich  . COLONOSCOPY WITH PROPOFOL N/A 05/02/2018   Procedure: COLONOSCOPY WITH PROPOFOL;  Surgeon: Manya Silvas, MD;  Location[facility];  Service: Endoscopy;  Laterality: N/A;  . EYE SURGERY Right    cataract  . FLEXIBLE SIGMOIDOSCOPY  1996   Dr Bary Castilla  . HEMORRHOID SURGERY  04-20-04   Dr Bary Castilla  . HERNIA REPAIR  72/62/0355   Umbilical, primary repair  . KNEE SURGERY Left    arthroscopy  . PARTIAL KNEE ARTHROPLASTY Right 09/05/2017   Procedure: UNICOMPARTMENTAL KNEE;  Surgeon: Corky Mull, MD;  Location: [facility] ORS;  Service: Orthopedics;  Laterality: Right;  . TONSILLECTOMY    . UMBILICAL HERNIA REPAIR N/A 09/27/2015   Procedure: HERNIA REPAIR UMBILICAL ADULT;  Surgeon: Robert Bellow, MD;  Location: [facility] ORS;  Service: General;  Laterality: N/A;    Home Medications:  Allergies as of 05/29/2018   No Known Allergies     Medication List        Accurate as of 05/29/18  1:54 PM. Always use your most recent med list.          enoxaparin 40 MG/0.4ML injection Commonly known as:  LOVENOX Inject 0.4 mLs (40 mg total) into the skin daily.   FLAX SEED OIL PO Take 2 capsules by mouth daily.   meloxicam 15 MG tablet Commonly known as:  MOBIC Take 15 mg by mouth daily.   MULTI-VITAMINS Tabs Take 1  tablet by mouth daily.   oxyCODONE 5 MG immediate release tablet Commonly known as:  Oxy IR/ROXICODONE Take 1-2 tablets (5-10 mg total) by mouth every 4 (four) hours as needed for breakthrough pain.   psyllium 0.52 g capsule Commonly known as:  REGULOID Take 4 capsules by mouth 2 (two) times daily.   SUPER B-COMPLEX Caps Take 1 capsule by mouth daily.   traZODone 50 MG tablet Commonly known as:  DESYREL Take 50 mg by mouth at bedtime as needed for sleep.   Turmeric 500 MG Caps Take 500 mg by mouth daily.   vitamin B-12 500 MCG tablet Commonly known as:  CYANOCOBALAMIN Take 1,000 mcg by mouth every other day.       Allergies: No Known Allergies  Family History: Family History  Problem Relation Age of Onset  . Diabetes Mother     Social History:  reports that he quit smoking about 7 years ago. His smoking use included cigarettes. He has a 40.00 pack-year smoking history. He has never used smokeless tobacco. He reports that he does not drink alcohol or use drugs.  ROS:  Physical Exam: There were no vitals taken for this visit.  Constitutional:  Alert and oriented, No acute distress. HEENT: [place] AT, moist mucus membranes.  Trachea midline, no masses. Cardiovascular: No clubbing, cyanosis, or edema. Respiratory: Normal respiratory effort, no increased work of breathing. GI: Abdomen is soft, nontender, nondistended, no abdominal masses GU: No CVA  DRE: prostate 25 grams with a nodule or two at the midline apex  Lymph: No cervical or inguinal lymphadenopathy. Skin: No rashes, bruises or suspicious lesions. Neurologic: Grossly intact, no focal deficits, moving all 4 extremities. Psychiatric: Normal mood and affect.  Laboratory Data: Lab Results  Component Value Date   WBC 7.9 09/06/2017   HGB 13.3 09/06/2017   HCT 38.1 (L) 09/06/2017   MCV 85.9 09/06/2017   PLT 173 09/06/2017    Lab Results  Component  Value Date   CREATININE 0.84 09/06/2017    No results found for: PSA  No results found for: TESTOSTERONE  No results found for: HGBA1C  Urinalysis    Component Value Date/Time   COLORURINE YELLOW 08/28/2017 0933   APPEARANCEUR CLEAR 08/28/2017 0933   LABSPEC 1.025 08/28/2017 0933   PHURINE 5.5 08/28/2017 0933   GLUCOSEU NEGATIVE 08/28/2017 0933   HGBUR NEGATIVE 08/28/2017 0933   BILIRUBINUR NEGATIVE 08/28/2017 0933   KETONESUR NEGATIVE 08/28/2017 0933   PROTEINUR NEGATIVE 08/28/2017 0933   NITRITE NEGATIVE 08/28/2017 0933   LEUKOCYTESUR NEGATIVE 08/28/2017 0933    No results found for: LABMICR, WBCUA, RBCUA, LABEPIT, MUCUS, BACTERIA  No results found for this or any previous visit. No results found for this or any previous visit. No results found for this or any previous visit. No results found for this or any previous visit. No results found for this or any previous visit. No results found for this or any previous visit. No results found for this or any previous visit. No results found for this or any previous visit.  Assessment & Plan:    Prostate nodule - PSA was repeated. I discussed with the patient the nature r/b of surveillance, MRI or prostate biopsy. He will return for prostate biopsy.    No follow-ups on file.  Festus Aloe, MD  [facility] [address], [place], [place] [address] [phone]

## 2018-05-30 LAB — MICROSCOPIC EXAMINATION

## 2018-05-30 LAB — URINALYSIS, COMPLETE
Bilirubin, UA: NEGATIVE
Glucose, UA: NEGATIVE
Ketones, UA: NEGATIVE
Leukocytes, UA: NEGATIVE
Nitrite, UA: NEGATIVE
Protein, UA: NEGATIVE
RBC, UA: NEGATIVE
Specific Gravity, UA: 1.02 (ref 1.005–1.030)
Urobilinogen, Ur: 0.2 mg/dL (ref 0.2–1.0)
pH, UA: 7 (ref 5.0–7.5)

## 2018-05-30 LAB — PSA: Prostate Specific Ag, Serum: 2.7 ng/mL (ref 0.0–4.0)

## 2018-06-02 ENCOUNTER — Telehealth: Payer: Self-pay

## 2018-06-02 NOTE — Telephone Encounter (Signed)
Left message

## 2018-06-02 NOTE — Telephone Encounter (Signed)
-----   Message from Festus Aloe, MD sent at 05/30/2018  5:11 PM EDT ----- Notify patient his PSA is in a normal range. But given the nodule on his prostate, I recommend we keep plan for prostate biopsy.   ----- Message ----- From: Garnette Gunner, CMA Sent: 05/30/2018  12:06 PM To: Festus Aloe, MD    ----- Message ----- From: Interface, [facility] Lab Results In Sent: 05/30/2018   5:40 AM To: [facility]

## 2018-06-02 NOTE — Telephone Encounter (Signed)
Pt informed

## 2018-06-11 DIAGNOSIS — Z23 Encounter for immunization: Secondary | ICD-10-CM | POA: Diagnosis not present

## 2018-07-10 ENCOUNTER — Other Ambulatory Visit: Payer: PPO

## 2018-07-24 ENCOUNTER — Ambulatory Visit: Payer: PPO

## 2018-08-12 ENCOUNTER — Telehealth: Payer: Self-pay

## 2018-08-12 ENCOUNTER — Ambulatory Visit: Payer: PPO | Admitting: Urology

## 2018-08-12 ENCOUNTER — Other Ambulatory Visit: Payer: Self-pay | Admitting: Urology

## 2018-08-12 ENCOUNTER — Other Ambulatory Visit: Payer: Self-pay

## 2018-08-12 ENCOUNTER — Encounter: Payer: Self-pay | Admitting: Urology

## 2018-08-12 VITALS — BP 126/79 | HR 78 | Ht 71.0 in | Wt 214.0 lb

## 2018-08-12 DIAGNOSIS — R972 Elevated prostate specific antigen [PSA]: Secondary | ICD-10-CM | POA: Diagnosis not present

## 2018-08-12 DIAGNOSIS — N402 Nodular prostate without lower urinary tract symptoms: Secondary | ICD-10-CM

## 2018-08-12 DIAGNOSIS — C61 Malignant neoplasm of prostate: Secondary | ICD-10-CM | POA: Diagnosis not present

## 2018-08-12 MED ORDER — GENTAMICIN SULFATE 40 MG/ML IJ SOLN
80.0000 mg | Freq: Once | INTRAMUSCULAR | Status: AC
Start: 2018-08-12 — End: 2018-08-12
  Administered 2018-08-12: 80 mg via INTRAMUSCULAR

## 2018-08-12 MED ORDER — LEVOFLOXACIN 500 MG PO TABS
500.0000 mg | ORAL_TABLET | Freq: Once | ORAL | Status: AC
  Administered 2018-08-12: 500 mg via ORAL

## 2018-08-12 NOTE — Progress Notes (Signed)
   08/12/18  CC:  Chief Complaint  Patient presents with  . Prostate Biopsy    HPI: 67 year old male with an irregular prostate with nodules at the midline apex who presents today for prostate biopsy.  Please see previous note from Dr. Junious Silk for details.  Vitals reviewed, see epic NED. A&Ox3.   No respiratory distress   Abd soft, NT, ND Normal sphincter tone  Prostate Biopsy Procedure   Informed consent was obtained after discussing risks/benefits of the procedure.  A time out was performed to ensure correct patient identity.  Pre-Procedure: - Gentamicin given prophylactically - Levaquin 500 mg administered PO -Transrectal Ultrasound performed revealing a 65.7 gm prostate -Discrete median lobe was appreciated but there was a hypoechoic lesion at the left mid base along with a generalized irregular heterogeneous prostate  Procedure: - Prostate block performed using 10 cc 1% lidocaine and biopsies taken from sextant areas, a total of 12 under ultrasound guidance.  I did take some additional biopsies at the right apex due to irregularity on ultrasound at this level as well as an additional biopsy at the left base in the area of the hypoechoic lesion.  Post-Procedure: - Patient tolerated the procedure well - He was counseled to seek immediate medical attention if experiences any severe pain, significant bleeding, or fevers - Return in one to two weeks to discuss biopsy results  Hollice Espy, MD

## 2018-08-12 NOTE — Telephone Encounter (Signed)
Pt contacted our office this morning requesting directions for his biopsy scheduled for today. Pt denies blood thinners or NSAIDs at this time. He was instructed on proper preparation using fleets enema and having light meal. Pt states understanding.

## 2018-08-13 ENCOUNTER — Telehealth: Payer: Self-pay | Admitting: *Deleted

## 2018-08-13 DIAGNOSIS — Z87891 Personal history of nicotine dependence: Secondary | ICD-10-CM

## 2018-08-13 DIAGNOSIS — Z122 Encounter for screening for malignant neoplasm of respiratory organs: Secondary | ICD-10-CM

## 2018-08-13 NOTE — Telephone Encounter (Signed)
Received referral for initial lung cancer screening scan. Contacted patient and obtained smoking history,(former, quit 2009, 30 pack year) as well as answering questions related to screening process. Patient denies signs of lung cancer such as weight loss or hemoptysis. Patient denies comorbidity that would prevent curative treatment if lung cancer were found. Patient is scheduled for shared decision making visit and CT scan on 09/02/18 at 2pm.

## 2018-08-14 ENCOUNTER — Telehealth: Payer: Self-pay | Admitting: Urology

## 2018-08-14 NOTE — Telephone Encounter (Signed)
Spoke with patient and he states he feels fine now, no fever.  Patient was instructed that this may have been a vaso/vagal response from straining , which could have come from having the prostate biopsy in the morning or could be from another underlying issue and should follow up with PCP. Also in regards to his forehead abrasion he should follow up with PCP for further evaluation.

## 2018-08-14 NOTE — Telephone Encounter (Signed)
Pt called office stating he had biopsy on Tues, states Tuesday evening pt blacked out while using restroom, felt clammy and weak, lowering himself to the floor, hitting his nose and forehead. Pt feels fine now, but has concerns about what happened and wants to know if that was normal. Please advise pt @ [phone].

## 2018-08-16 LAB — PATHOLOGY REPORT

## 2018-08-19 ENCOUNTER — Other Ambulatory Visit: Payer: Self-pay | Admitting: Urology

## 2018-08-22 ENCOUNTER — Telehealth: Payer: Self-pay | Admitting: Urology

## 2018-08-22 NOTE — Telephone Encounter (Signed)
Please advise. Pt has seen these results on mychart, he is very concerned and wants an answer ASAP.

## 2018-08-22 NOTE — Telephone Encounter (Signed)
Pt lmom asking to have someone please call him about his biopsy results. States he has seen his results online and wants someone who understands and can explain to him what he is reading.  Please call pt at [phone] (cell) or [phone]. Thank you.

## 2018-08-24 NOTE — Telephone Encounter (Signed)
I have never seen this patient.  He saw Dr. Junious Silk initially and the biopsy was performed by Dr. Erlene Quan.  He is scheduled to see me on 9/25 and it would be better to discuss the results at that time.

## 2018-08-27 ENCOUNTER — Ambulatory Visit (INDEPENDENT_AMBULATORY_CARE_PROVIDER_SITE_OTHER): Payer: PPO | Admitting: Urology

## 2018-08-27 ENCOUNTER — Encounter: Payer: Self-pay | Admitting: Urology

## 2018-08-27 ENCOUNTER — Telehealth: Payer: Self-pay | Admitting: Urology

## 2018-08-27 VITALS — BP 132/77 | HR 87 | Ht 71.0 in | Wt 214.2 lb

## 2018-08-27 DIAGNOSIS — C61 Malignant neoplasm of prostate: Secondary | ICD-10-CM | POA: Diagnosis not present

## 2018-08-27 NOTE — Progress Notes (Signed)
08/27/2018 12:44 PM   Adrian Hart [birth year] [id number]  Referring provider: Kirk Ruths, MD [place] [facility] [place], [place] [address]  Chief Complaint  Patient presents with  . Results    HPI: 67 year old male who underwent prostate biopsy by Dr. Erlene Quan on 08/12/2018 for an abnormal DRE.  His PSA was 2.7.  Prostate volume was 66 g.  He had no post biopsy complaints.  Pathology: 4/12 cores were positive for Gleason 3+3 adenocarcinoma.  Involved cores were the left lateral base, left mid, left lateral mid and left apex.  Percentage of tissue involved range from 1-25%.   PMH: Past Medical History:  Diagnosis Date  . GERD (gastroesophageal reflux disease)    PT TAKES APPLE CIDER VINEGAR     Surgical History: Past Surgical History:  Procedure Laterality Date  . COLONOSCOPY  2003   Dr Alveta Heimlich  . COLONOSCOPY WITH PROPOFOL N/A 05/02/2018   Procedure: COLONOSCOPY WITH PROPOFOL;  Surgeon: Manya Silvas, MD;  Location: [facility] ENDOSCOPY;  Service: Endoscopy;  Laterality: N/A;  . EYE SURGERY Right    cataract  . FLEXIBLE SIGMOIDOSCOPY  1996   Dr Bary Castilla  . HEMORRHOID SURGERY  04-20-04   Dr Bary Castilla  . HERNIA REPAIR  98/92/1194   Umbilical, primary repair  . KNEE SURGERY Left    arthroscopy  . PARTIAL KNEE ARTHROPLASTY Right 09/05/2017   Procedure: UNICOMPARTMENTAL KNEE;  Surgeon: Corky Mull, MD;  Location: [facility] ORS;  Service: Orthopedics;  Laterality: Right;  . TONSILLECTOMY    . UMBILICAL HERNIA REPAIR N/A 09/27/2015   Procedure: HERNIA REPAIR UMBILICAL ADULT;  Surgeon: Robert Bellow, MD;  Location: [facility] ORS;  Service: General;  Laterality: N/A;    Home Medications:  Allergies as of 08/27/2018   No Known Allergies     Medication List        Accurate as of 08/27/18 12:44 PM. Always use your most recent med list.          FLAX SEED OIL PO Take 2 capsules by mouth daily.   meloxicam 15 MG tablet Commonly known as:   MOBIC Take 15 mg by mouth daily.   MULTI-VITAMINS Tabs Take 1 tablet by mouth daily.   psyllium 0.52 g capsule Commonly known as:  REGULOID Take 4 capsules by mouth 2 (two) times daily.   SUPER B-COMPLEX Caps Take 1 capsule by mouth daily.   traZODone 50 MG tablet Commonly known as:  DESYREL Take 50 mg by mouth at bedtime as needed for sleep.   Turmeric 500 MG Caps Take 500 mg by mouth daily.   vitamin B-12 500 MCG tablet Commonly known as:  CYANOCOBALAMIN Take 1,000 mcg by mouth every other day.       Allergies: No Known Allergies  Family History: Family History  Problem Relation Age of Onset  . Diabetes Mother   . Bladder Cancer Neg Hx   . Kidney cancer Neg Hx   . Prostate cancer Neg Hx     Social History:  reports that he quit smoking about 7 years ago. His smoking use included cigarettes. He has a 40.00 pack-year smoking history. He has never used smokeless tobacco. He reports that he does not drink alcohol or use drugs.  ROS: UROLOGY Frequent Urination?: No Hard to postpone urination?: No Burning/pain with urination?: No Get up at night to urinate?: Yes Leakage of urine?: Yes Urine stream starts and stops?: No Trouble starting stream?: No Do you have to  strain to urinate?: No Blood in urine?: No Urinary tract infection?: No Sexually transmitted disease?: No Injury to kidneys or bladder?: No Painful intercourse?: No Weak stream?: No Erection problems?: No Penile pain?: No  Gastrointestinal Nausea?: No Vomiting?: No Indigestion/heartburn?: No Diarrhea?: No Constipation?: No  Constitutional Fever: No Night sweats?: No Weight loss?: No Fatigue?: Yes  Skin Skin rash/lesions?: No Itching?: No  Eyes Blurred vision?: No Double vision?: No  Ears/Nose/Throat Sore throat?: No Sinus problems?: No  Hematologic/Lymphatic Swollen glands?: No Easy bruising?: Yes  Cardiovascular Leg swelling?: No Chest pain?: No  Respiratory Cough?:  No Shortness of breath?: No  Endocrine Excessive thirst?: No  Musculoskeletal Back pain?: No Joint pain?: No  Neurological Headaches?: No Dizziness?: No  Psychologic Depression?: No Anxiety?: No  Physical Exam: BP 132/77 (BP Location: Left Arm, Patient Position: Sitting, Cuff Size: Large)   Pulse 87   Ht 5\' 11"  (1.803 m)   Wt 214 lb 3.2 oz (97.2 kg)   BMI 29.87 kg/m   Constitutional:  Alert and oriented, No acute distress.   Assessment & Plan:   I discussed the pathology report in detail with Adrian Hart and his wife.  He has low risk adenocarcinoma the prostate.  We discussed management options of active surveillance and curative treatments including radical prostatectomy, radiation modalities, cryoablation and HIFU.  He would like to think over these options.  Active surveillance was discussed and the need for monitoring his PSA every 6 months and a confirmatory biopsy approximately 1 year after initial diagnosis.  At this point he is leaning towards active surveillance but would like to think over further.  Will schedule a prostate MRI to assess his suitability for active surveillance.  Second opinion at one of the nearby medical centers was also offered.  Greater than 50% of this 15-minute visit was spent counseling the patient.   Abbie Sons, [place] [address] [place], [place] [address] [phone]

## 2018-08-27 NOTE — Telephone Encounter (Signed)
-----   Message from Abbie Sons, MD sent at 08/27/2018  1:05 PM EDT ----- I put in an order for prostate MRI but want to hold off for approximately 3 months before it is done

## 2018-08-27 NOTE — Telephone Encounter (Signed)
FYI

## 2018-09-02 ENCOUNTER — Ambulatory Visit
Admission: RE | Admit: 2018-09-02 | Discharge: 2018-09-02 | Disposition: A | Discharge status: Home / Self Care | Payer: PPO | Source: Ambulatory Visit | Attending: Oncology | Admitting: Oncology

## 2018-09-02 ENCOUNTER — Encounter: Payer: Self-pay | Admitting: Oncology

## 2018-09-02 ENCOUNTER — Inpatient Hospital Stay: Payer: PPO | Attending: Oncology | Admitting: Oncology

## 2018-09-02 DIAGNOSIS — Z122 Encounter for screening for malignant neoplasm of respiratory organs: Secondary | ICD-10-CM

## 2018-09-02 DIAGNOSIS — Z87891 Personal history of nicotine dependence: Secondary | ICD-10-CM | POA: Diagnosis not present

## 2018-09-02 DIAGNOSIS — I700 Atherosclerosis of aorta: Secondary | ICD-10-CM | POA: Insufficient documentation

## 2018-09-02 DIAGNOSIS — R918 Other nonspecific abnormal finding of lung field: Secondary | ICD-10-CM | POA: Insufficient documentation

## 2018-09-02 DIAGNOSIS — J432 Centrilobular emphysema: Secondary | ICD-10-CM | POA: Insufficient documentation

## 2018-09-02 DIAGNOSIS — I2510 Atherosclerotic heart disease of native coronary artery without angina pectoris: Secondary | ICD-10-CM | POA: Diagnosis not present

## 2018-09-02 NOTE — Progress Notes (Signed)
In accordance with CMS guidelines, patient has met eligibility criteria including age, absence of signs or symptoms of lung cancer.  Social History   Tobacco Use  . Smoking status: Former Smoker    Packs/day: 0.75    Years: 40.00    Pack years: 30.00    Types: Cigarettes    Last attempt to quit: 2009    Years since quitting: 10.7  . Smokeless tobacco: Never Used  Substance Use Topics  . Alcohol use: No    Alcohol/week: 0.0 standard drinks  . Drug use: No     A shared decision-making session was conducted prior to the performance of CT scan. This includes one or more decision aids, includes benefits and harms of screening, follow-up diagnostic testing, over-diagnosis, false positive rate, and total radiation exposure.  Counseling on the importance of adherence to annual lung cancer LDCT screening, impact of co-morbidities, and ability or willingness to undergo diagnosis and treatment is imperative for compliance of the program.  Counseling on the importance of continued smoking cessation for former smokers; the importance of smoking cessation for current smokers, and information about tobacco cessation interventions have been given to patient including [place] and [employer] quit [place] programs.  Written order for lung cancer screening with LDCT has been given to the patient and any and all questions have been answered to the best of my abilities.   Yearly follow up will be coordinated by Burgess Estelle, Thoracic Navigator.  Faythe Casa, NP 09/02/2018 3:09 PM

## 2018-09-03 ENCOUNTER — Encounter: Payer: Self-pay | Admitting: *Deleted

## 2018-09-04 DIAGNOSIS — I2510 Atherosclerotic heart disease of native coronary artery without angina pectoris: Secondary | ICD-10-CM | POA: Insufficient documentation

## 2018-12-17 DIAGNOSIS — M1732 Unilateral post-traumatic osteoarthritis, left knee: Secondary | ICD-10-CM | POA: Diagnosis not present

## 2018-12-18 ENCOUNTER — Ambulatory Visit
Admission: RE | Admit: 2018-12-18 | Discharge: 2018-12-18 | Disposition: A | Discharge status: Home / Self Care | Payer: PPO | Source: Ambulatory Visit | Attending: Urology | Admitting: Urology

## 2018-12-18 DIAGNOSIS — C61 Malignant neoplasm of prostate: Secondary | ICD-10-CM | POA: Insufficient documentation

## 2018-12-18 DIAGNOSIS — K5730 Diverticulosis of large intestine without perforation or abscess without bleeding: Secondary | ICD-10-CM | POA: Diagnosis not present

## 2018-12-18 LAB — POCT I-STAT CREATININE: Creatinine, Ser: 0.8 mg/dL (ref 0.61–1.24)

## 2018-12-18 MED ORDER — GADOBUTROL 1 MMOL/ML IV SOLN
10.0000 mL | Freq: Once | INTRAVENOUS | Status: AC | PRN
  Administered 2018-12-18: 9 mL via INTRAVENOUS

## 2018-12-29 DIAGNOSIS — R739 Hyperglycemia, unspecified: Secondary | ICD-10-CM | POA: Diagnosis not present

## 2018-12-29 DIAGNOSIS — E785 Hyperlipidemia, unspecified: Secondary | ICD-10-CM | POA: Diagnosis not present

## 2018-12-30 ENCOUNTER — Encounter: Payer: Self-pay | Admitting: Urology

## 2018-12-30 ENCOUNTER — Ambulatory Visit (INDEPENDENT_AMBULATORY_CARE_PROVIDER_SITE_OTHER): Payer: PPO | Admitting: Urology

## 2018-12-30 VITALS — BP 156/95 | HR 87 | Ht 71.0 in | Wt 201.6 lb

## 2018-12-30 DIAGNOSIS — C61 Malignant neoplasm of prostate: Secondary | ICD-10-CM | POA: Diagnosis not present

## 2018-12-30 NOTE — Progress Notes (Signed)
12/30/2018 2:14 PM   Adrian Hart 1951-10-31 [id number]  Referring provider: Kirk Ruths, MD [place] [facility] [place], [place] [address]  Chief Complaint  Patient presents with  . Results   Urologic history: 1. cT1c adenocarcinoma prostate low risk  - Biopsy 08/2018; abnormal DRE; PSA 2.7; 66 g prostate  - 4/12 cores positive Gleason 3+3 (LLB, LLM, LM, LA)  - 1-25% involvement  - Nodules described at midline apex  - Elected surveillance   HPI: 68 year old male presents for follow-up of prostate cancer.  Refer to my previous note of 08/27/2018.  He presents today for his prostate MRI results.  He did have 2 PI-RADS 4 lesions at the left base and left mid gland.  No adenopathy noted.  Capsule intact and seminal vesicles normal.   PMH: Past Medical History:  Diagnosis Date  . GERD (gastroesophageal reflux disease)    PT TAKES APPLE CIDER VINEGAR     Surgical History: Past Surgical History:  Procedure Laterality Date  . COLONOSCOPY  2003   Dr Alveta Heimlich  . COLONOSCOPY WITH PROPOFOL N/A 05/02/2018   Procedure: COLONOSCOPY WITH PROPOFOL;  Surgeon: Manya Silvas, MD;  Location: [facility] ENDOSCOPY;  Service: Endoscopy;  Laterality: N/A;  . EYE SURGERY Right    cataract  . FLEXIBLE SIGMOIDOSCOPY  1996   Dr Bary Castilla  . HEMORRHOID SURGERY  04-20-04   Dr Bary Castilla  . HERNIA REPAIR  [id number]   Umbilical, primary repair  . KNEE SURGERY Left    arthroscopy  . PARTIAL KNEE ARTHROPLASTY Right 09/05/2017   Procedure: UNICOMPARTMENTAL KNEE;  Surgeon: Corky Mull, MD;  Location: [facility] ORS;  Service: Orthopedics;  Laterality: Right;  . TONSILLECTOMY    . UMBILICAL HERNIA REPAIR N/A 09/27/2015   Procedure: HERNIA REPAIR UMBILICAL ADULT;  Surgeon: Robert Bellow, MD;  Location: [facility] ORS;  Service: General;  Laterality: N/A;    Home Medications:  Allergies as of 12/30/2018   No Known Allergies     Medication List       Accurate as of December 30, 2018  2:14 PM. Always use your most recent med list.        FLAX SEED OIL PO Take 2 capsules by mouth daily.   meloxicam 15 MG tablet Commonly known as:  MOBIC Take 15 mg by mouth as needed.   MULTI-VITAMINS Tabs Take 1 tablet by mouth daily.   psyllium 0.52 g capsule Commonly known as:  REGULOID Take 4 capsules by mouth 2 (two) times daily.   SUPER B-COMPLEX Caps Take 1 capsule by mouth daily.   traZODone 50 MG tablet Commonly known as:  DESYREL Take 50 mg by mouth at bedtime as needed for sleep.   vitamin B-12 500 MCG tablet Commonly known as:  CYANOCOBALAMIN Take 1,000 mcg by mouth every other day.       Allergies: No Known Allergies  Family History: Family History  Problem Relation Age of Onset  . Diabetes Mother   . Bladder Cancer Neg Hx   . Kidney cancer Neg Hx   . Prostate cancer Neg Hx     Social History:  reports that he quit smoking about 11 years ago. His smoking use included cigarettes. He has a 30.00 pack-year smoking history. He has never used smokeless tobacco. He reports that he does not drink alcohol or use drugs.  ROS: UROLOGY Frequent Urination?: No Hard to postpone urination?: No Burning/pain with urination?: No Get up at night to  urinate?: No Leakage of urine?: No Urine stream starts and stops?: No Trouble starting stream?: No Do you have to strain to urinate?: No Blood in urine?: No Urinary tract infection?: No Sexually transmitted disease?: No Injury to kidneys or bladder?: No Painful intercourse?: No Weak stream?: No Erection problems?: No Penile pain?: No  Gastrointestinal Nausea?: No Vomiting?: No Indigestion/heartburn?: No Diarrhea?: No Constipation?: No  Constitutional Fever: No Night sweats?: No Weight loss?: No Fatigue?: No  Skin Skin rash/lesions?: No Itching?: No  Eyes Blurred vision?: No Double vision?: No  Ears/Nose/Throat Sore throat?: No Sinus problems?: No  Hematologic/Lymphatic Swollen  glands?: No Easy bruising?: No  Cardiovascular Leg swelling?: No Chest pain?: No  Respiratory Cough?: No Shortness of breath?: No  Endocrine Excessive thirst?: No  Musculoskeletal Back pain?: No Joint pain?: No  Neurological Headaches?: No Dizziness?: No  Psychologic Depression?: No Anxiety?: No  Physical Exam: BP (!) 156/95 (BP Location: Left Arm, Patient Position: Sitting, Cuff Size: Normal)   Pulse 87   Ht 5\' 11"  (1.803 m)   Wt 201 lb 9.6 oz (91.4 kg)   BMI 28.12 kg/m   Constitutional:  Alert and oriented, No acute distress.   Assessment & Plan:   68 year old male with low risk prostate cancer on surveillance.  Prostate MRI did show PI-RADS 4 lesions x2 in the left prostate.  We discussed that PI-RADS 4 lesions are suspicious for high-grade prostate cancer.  If he desires to continue active surveillance I have recommended scheduling a fusion biopsy in [place].  If he desires to proceed with treatment repeat biopsy would not be absolutely necessary.  He is leaning towards continued surveillance and would like to schedule the fusion biopsy.  Greater than 50% of this 15-minute visit was spent counseling the patient.    Abbie Sons, [place] [address] [place], [place] [address] [phone]

## 2019-01-05 DIAGNOSIS — R7309 Other abnormal glucose: Secondary | ICD-10-CM | POA: Diagnosis not present

## 2019-01-05 DIAGNOSIS — I2510 Atherosclerotic heart disease of native coronary artery without angina pectoris: Secondary | ICD-10-CM | POA: Diagnosis not present

## 2019-01-05 DIAGNOSIS — Z0000 Encounter for general adult medical examination without abnormal findings: Secondary | ICD-10-CM | POA: Diagnosis not present

## 2019-01-09 DIAGNOSIS — C61 Malignant neoplasm of prostate: Secondary | ICD-10-CM | POA: Diagnosis not present

## 2019-01-14 ENCOUNTER — Other Ambulatory Visit: Payer: Self-pay | Admitting: Urology

## 2019-01-23 ENCOUNTER — Ambulatory Visit: Payer: PPO | Admitting: Urology

## 2019-01-23 NOTE — Progress Notes (Signed)
01/26/2019  1:00 PM   Adrian Hart 19-Nov-1951 [id number]  Referring provider: Kirk Ruths, MD [facility] [place] [place], [place] [address]  Chief Complaint  Patient presents with  . Results   Urologic history: 1. cT1c adenocarcinoma prostate low risk             - Biopsy 08/2018; abnormal DRE; PSA 2.7; 66 g prostate             - 4/12 cores positive Gleason 3+3 (LLB, LLM, LM, LA)             - 1-25% involvement             - Nodules described at midline apex             - Elected surveillance  - Prostate MRI (12/18/18) results with 2 PI-RADS 4 lesions at left base and left mid gland concerning for high-grade carcinoma, no adenopathy, capsule intact, normal seminal vesicles  - Fusion Biopsy: PI-RADS 4 lesions were benign, left side of biopsy positive for Gleason 3+3  HPI: Adrian Hart is a 68 y.o. White or Caucasian male that presents today for fusion biopsy results. He is accompanied by his wife.  - Patient would like to pursue surveillance at this time  - Reports no bothersome urinary symptoms  PMH: Past Medical History:  Diagnosis Date  . GERD (gastroesophageal reflux disease)    PT TAKES APPLE CIDER VINEGAR     Surgical History: Past Surgical History:  Procedure Laterality Date  . COLONOSCOPY  2003   Dr Alveta Heimlich  . COLONOSCOPY WITH PROPOFOL N/A 05/02/2018   Procedure: COLONOSCOPY WITH PROPOFOL;  Surgeon: Manya Silvas, MD;  Location: [facility] ENDOSCOPY;  Service: Endoscopy;  Laterality: N/A;  . EYE SURGERY Right    cataract  . FLEXIBLE SIGMOIDOSCOPY  1996   Dr Bary Castilla  . HEMORRHOID SURGERY  04-20-04   Dr Bary Castilla  . HERNIA REPAIR  05/26/7627   Umbilical, primary repair  . KNEE SURGERY Left    arthroscopy  . PARTIAL KNEE ARTHROPLASTY Right 09/05/2017   Procedure: UNICOMPARTMENTAL KNEE;  Surgeon: Corky Mull, MD;  Location: [facility] ORS;  Service: Orthopedics;  Laterality: Right;  . TONSILLECTOMY    . UMBILICAL HERNIA REPAIR N/A  09/27/2015   Procedure: HERNIA REPAIR UMBILICAL ADULT;  Surgeon: Robert Bellow, MD;  Location: [facility] ORS;  Service: General;  Laterality: N/A;    Home Medications:  Allergies as of 01/26/2019   No Known Allergies     Medication List       Accurate as of January 26, 2019  1:00 PM. Always use your most recent med list.        FLAX SEED OIL PO Take 2 capsules by mouth daily.   meloxicam 15 MG tablet Commonly known as:  MOBIC Take 15 mg by mouth as needed.   MULTI-VITAMINS Tabs Take 1 tablet by mouth daily.   psyllium 0.52 g capsule Commonly known as:  REGULOID Take 4 capsules by mouth 2 (two) times daily.   SAW PALMETTO PO Take by mouth.   SUPER B-COMPLEX Caps Take 1 capsule by mouth daily.   traZODone 50 MG tablet Commonly known as:  DESYREL Take 50 mg by mouth at bedtime as needed for sleep.   vitamin B-12 500 MCG tablet Commonly known as:  CYANOCOBALAMIN Take 1,000 mcg by mouth every other day.       Allergies: No Known Allergies  Family History: Family History  Problem Relation Age of Onset  . Diabetes Mother   . Bladder Cancer Neg Hx   . Kidney cancer Neg Hx   . Prostate cancer Neg Hx     Social History:  reports that he quit smoking about 11 years ago. His smoking use included cigarettes. He has a 30.00 pack-year smoking history. He has never used smokeless tobacco. He reports that he does not drink alcohol or use drugs.  ROS: UROLOGY Frequent Urination?: Yes Hard to postpone urination?: No Burning/pain with urination?: No Get up at night to urinate?: No Leakage of urine?: No Urine stream starts and stops?: Yes Trouble starting stream?: No Do you have to strain to urinate?: No Blood in urine?: No Urinary tract infection?: No Sexually transmitted disease?: No Injury to kidneys or bladder?: No Painful intercourse?: No Weak stream?: Yes Erection problems?: No Penile pain?: No  Gastrointestinal Nausea?: No Vomiting?:  No Indigestion/heartburn?: No Diarrhea?: No Constipation?: No  Constitutional Fever: No Night sweats?: No Weight loss?: No Fatigue?: No  Skin Skin rash/lesions?: No Itching?: No  Eyes Blurred vision?: No Double vision?: No  Ears/Nose/Throat Sore throat?: No Sinus problems?: No  Hematologic/Lymphatic Swollen glands?: No Easy bruising?: No  Cardiovascular Leg swelling?: No Chest pain?: No  Respiratory Cough?: No Shortness of breath?: No  Endocrine Excessive thirst?: No  Musculoskeletal Back pain?: No Joint pain?: No  Neurological Headaches?: No Dizziness?: No  Psychologic Depression?: No Anxiety?: No  Physical Exam: BP (!) 145/78 (BP Location: Left Arm, Patient Position: Sitting, Cuff Size: Normal)   Pulse 89   Ht 5\' 11"  (1.803 m)   Wt 203 lb 4.8 oz (92.2 kg)   BMI 28.35 kg/m   Constitutional:  Alert and oriented, No acute distress. Respiratory: Normal respiratory effort, no increased work of breathing. Head: Normocephalic and Atraumatic. GU: No CVA tenderness Skin: No rashes, bruises or suspicious lesions. Neurologic: Grossly intact, no focal deficits, moving all 4 extremities. Psychiatric: Normal mood and affect.  Laboratory Data: Lab Results  Component Value Date   CREATININE 0.80 12/18/2018   Assessment & Plan:   1. Prostate Cancer  - Fusion Biopsy: PI-RADS 4 lesions were benign, left side of biopsy positive for Gleason 3+3  - Although low risk he does have high volume Gleason 3+3 disease  - Discussed benefits of additional testing, Oncotype Dx, to determine varying degrees of tumor aggressiveness, explained indications for surveillance v. Treatment.  - Patient is still interested in surveillance and would like to move forward with Oncotype Dx  - He will be notified with results   Abbie Sons, [place] [address], [place], [place] [address] [phone]  I, Temidayo Atanda-Ogunleye  , am acting as a scribe for Abbie Sons, MD  I, Abbie Sons, MD, have reviewed all documentation for this visit. The documentation on 01/26/19 for the exam, diagnosis, procedures, and orders are all accurate and complete.

## 2019-01-26 ENCOUNTER — Encounter: Payer: Self-pay | Admitting: Urology

## 2019-01-26 ENCOUNTER — Ambulatory Visit (INDEPENDENT_AMBULATORY_CARE_PROVIDER_SITE_OTHER): Payer: PPO | Admitting: Urology

## 2019-01-26 VITALS — BP 145/78 | HR 89 | Ht 71.0 in | Wt 203.3 lb

## 2019-01-26 DIAGNOSIS — C61 Malignant neoplasm of prostate: Secondary | ICD-10-CM | POA: Diagnosis not present

## 2019-03-04 ENCOUNTER — Telehealth: Payer: Self-pay | Admitting: Urology

## 2019-03-04 NOTE — Telephone Encounter (Signed)
Pt LMOM and request a call back with his lab results for "genetic testing". Please advise.

## 2019-03-06 NOTE — Telephone Encounter (Signed)
Patient is wanting results, do you want to do this as a virtual visit or just have [place] call with results, please advise

## 2019-03-11 ENCOUNTER — Telehealth: Payer: Self-pay

## 2019-03-11 NOTE — Telephone Encounter (Signed)
-----   Message from Abbie Sons, MD sent at 01/26/2019 12:56 PM EST ----- Regarding: Oncotype DX I would like to send his path for Oncotype DX testing.  Thanks

## 2019-03-11 NOTE — Telephone Encounter (Signed)
Oncotype Genetic test form faxed

## 2019-03-17 ENCOUNTER — Telehealth: Payer: Self-pay

## 2019-03-17 NOTE — Telephone Encounter (Signed)
Incoming fax from genomic (oncotype) with a form for medical necessity of test. Form signed by physician and faxed back.

## 2019-03-27 DIAGNOSIS — C61 Malignant neoplasm of prostate: Secondary | ICD-10-CM | POA: Diagnosis not present

## 2019-05-13 DIAGNOSIS — R1032 Left lower quadrant pain: Secondary | ICD-10-CM | POA: Diagnosis not present

## 2019-05-13 DIAGNOSIS — Z1331 Encounter for screening for depression: Secondary | ICD-10-CM | POA: Diagnosis not present

## 2019-05-21 ENCOUNTER — Telehealth: Payer: Self-pay | Admitting: Urology

## 2019-05-21 NOTE — Telephone Encounter (Signed)
Patient called asking for his results on the genomic (oncotype test that was faxed in April. Looks like it has not come back. Can someone check to see if they ever got it? He is upset and wants the results. He would like a call back today please   Thanks, Sharyn Lull

## 2019-06-17 ENCOUNTER — Other Ambulatory Visit: Payer: Self-pay | Admitting: Urology

## 2019-06-17 ENCOUNTER — Telehealth: Payer: Self-pay | Admitting: Urology

## 2019-06-17 NOTE — Telephone Encounter (Signed)
Lab results were placed on desk there was a delay due to covid

## 2019-06-17 NOTE — Telephone Encounter (Signed)
Pt states that he has been waiting for lab results (about his  Prostate) he would like a call back. Please advise.

## 2019-06-19 NOTE — Telephone Encounter (Signed)
Pt informed

## 2019-06-19 NOTE — Telephone Encounter (Signed)
Genetic test results indicate his prostate cancer is not aggressive and that continued surveillance is an acceptable option.  He will be due for a PSA/DRE in August 2020

## 2019-06-19 NOTE — Telephone Encounter (Signed)
Results given and appts made Pt is aware  Sharyn Lull

## 2019-07-10 DIAGNOSIS — M1732 Unilateral post-traumatic osteoarthritis, left knee: Secondary | ICD-10-CM | POA: Diagnosis not present

## 2019-07-10 DIAGNOSIS — M23204 Derangement of unspecified medial meniscus due to old tear or injury, left knee: Secondary | ICD-10-CM | POA: Diagnosis not present

## 2019-07-10 DIAGNOSIS — M25562 Pain in left knee: Secondary | ICD-10-CM | POA: Diagnosis not present

## 2019-07-22 ENCOUNTER — Other Ambulatory Visit: Payer: Self-pay

## 2019-07-22 DIAGNOSIS — C61 Malignant neoplasm of prostate: Secondary | ICD-10-CM

## 2019-07-23 ENCOUNTER — Other Ambulatory Visit: Payer: Self-pay

## 2019-07-23 ENCOUNTER — Other Ambulatory Visit: Payer: PPO

## 2019-07-23 DIAGNOSIS — C61 Malignant neoplasm of prostate: Secondary | ICD-10-CM

## 2019-07-24 LAB — PSA: Prostate Specific Ag, Serum: 2.8 ng/mL (ref 0.0–4.0)

## 2019-07-28 ENCOUNTER — Encounter: Payer: Self-pay | Admitting: Urology

## 2019-07-28 ENCOUNTER — Other Ambulatory Visit: Payer: Self-pay

## 2019-07-28 ENCOUNTER — Ambulatory Visit: Payer: PPO | Admitting: Urology

## 2019-07-28 VITALS — BP 127/76 | HR 93 | Ht 71.0 in | Wt 193.0 lb

## 2019-07-28 DIAGNOSIS — C61 Malignant neoplasm of prostate: Secondary | ICD-10-CM | POA: Diagnosis not present

## 2019-07-28 DIAGNOSIS — K4090 Unilateral inguinal hernia, without obstruction or gangrene, not specified as recurrent: Secondary | ICD-10-CM

## 2019-07-28 NOTE — Progress Notes (Signed)
07/28/2019 9:25 AM   Adrian Hart [birth year] [id number]  Referring provider: Kirk Ruths, MD [facility] [place],  [place] [address]  Chief Complaint  Patient presents with  . Prostate Cancer    Urologic history: 1. cT1cadenocarcinoma prostate low risk -Biopsy 08/2018; abnormal DRE; PSA 2.7; 66 g prostate -4/12 corespositive Gleason 3+3 (LLB,LLM, LM,LA) - 1-25% involvement -Nodules described at midline apex -Elected surveillance             - Prostate MRI (12/18/18) results with 2 PI-RADS 4 lesions at left base and left mid gland concerning for high-grade carcinoma, no adenopathy, capsule intact, normal seminal vesicles             - Fusion Biopsy: PI-RADS 4 lesions were benign, left side of biopsy positive for Gleason 3+3   HPI: 68 y.o. male presents for follow-up of low risk prostate cancer on active surveillance.  Since his last visit he states he has been doing well.  He denies bothersome lower urinary tract symptoms.  He thinks he may have a left inguinal hernia and notes intermittent left groin pain but has been asymptomatic for the past 2 months.  Denies dysuria or gross hematuria.  His pathology did undergo Oncotype DX testing with a GPS score of 21 indicating low risk prostate cancer.  PSA on 07/23/2019 was stable at 2.8.   PMH: Past Medical History:  Diagnosis Date  . GERD (gastroesophageal reflux disease)    PT TAKES APPLE CIDER VINEGAR     Surgical History: Past Surgical History:  Procedure Laterality Date  . COLONOSCOPY  2003   Dr Alveta Heimlich  . COLONOSCOPY WITH PROPOFOL N/A 05/02/2018   Procedure: COLONOSCOPY WITH PROPOFOL;  Surgeon: Manya Silvas, MD;  Location: [facility] ENDOSCOPY;  Service: Endoscopy;  Laterality: N/A;  . EYE SURGERY Right    cataract  . FLEXIBLE SIGMOIDOSCOPY  1996   Dr Bary Castilla  . HEMORRHOID SURGERY  04-20-04   Dr Bary Castilla  . HERNIA  REPAIR  [id number]   Umbilical, primary repair  . KNEE SURGERY Left    arthroscopy  . PARTIAL KNEE ARTHROPLASTY Right 09/05/2017   Procedure: UNICOMPARTMENTAL KNEE;  Surgeon: Corky Mull, MD;  Location: [facility] ORS;  Service: Orthopedics;  Laterality: Right;  . TONSILLECTOMY    . UMBILICAL HERNIA REPAIR N/A 09/27/2015   Procedure: HERNIA REPAIR UMBILICAL ADULT;  Surgeon: Robert Bellow, MD;  Location: [facility] ORS;  Service: General;  Laterality: N/A;    Home Medications:  Allergies as of 07/28/2019   No Known Allergies     Medication List       Accurate as of July 28, 2019  9:25 AM. If you have any questions, ask your nurse or doctor.        FLAX SEED OIL PO Take 2 capsules by mouth daily.   meloxicam 15 MG tablet Commonly known as: MOBIC Take 15 mg by mouth as needed.   Multi-Vitamins Tabs Take 1 tablet by mouth daily.   psyllium 0.52 g capsule Commonly known as: REGULOID Take 4 capsules by mouth 2 (two) times daily.   SAW PALMETTO PO Take by mouth.   Super B-Complex Caps Take 1 capsule by mouth daily.   traZODone 50 MG tablet Commonly known as: DESYREL Take 50 mg by mouth at bedtime as needed for sleep.   vitamin B-12 500 MCG tablet Commonly known as: CYANOCOBALAMIN Take 1,000 mcg by mouth every other day.  Allergies: No Known Allergies  Family History: Family History  Problem Relation Age of Onset  . Diabetes Mother   . Bladder Cancer Neg Hx   . Kidney cancer Neg Hx   . Prostate cancer Neg Hx     Social History:  reports that he quit smoking about 11 years ago. His smoking use included cigarettes. He has a 30.00 pack-year smoking history. He has never used smokeless tobacco. He reports that he does not drink alcohol or use drugs.  ROS: UROLOGY Frequent Urination?: No Hard to postpone urination?: No Burning/pain with urination?: No Get up at night to urinate?: No Leakage of urine?: No Urine stream starts and stops?: No Trouble starting  stream?: No Do you have to strain to urinate?: No Blood in urine?: No Urinary tract infection?: No Sexually transmitted disease?: No Injury to kidneys or bladder?: No Painful intercourse?: No Weak stream?: No Erection problems?: No Penile pain?: No  Gastrointestinal Nausea?: No Vomiting?: No Indigestion/heartburn?: No Diarrhea?: No Constipation?: No  Constitutional Fever: No Night sweats?: No Weight loss?: No Fatigue?: No  Skin Skin rash/lesions?: No Itching?: No  Eyes Blurred vision?: No Double vision?: No  Ears/Nose/Throat Sore throat?: No Sinus problems?: No  Hematologic/Lymphatic Swollen glands?: No Easy bruising?: No  Cardiovascular Leg swelling?: No Chest pain?: No  Respiratory Cough?: No Shortness of breath?: No  Endocrine Excessive thirst?: No  Musculoskeletal Back pain?: No Joint pain?: No  Neurological Headaches?: No Dizziness?: No  Psychologic Depression?: No Anxiety?: No  Physical Exam: BP 127/76   Pulse 93   Ht 5\' 11"  (1.803 m)   Wt 193 lb (87.5 kg)   BMI 26.92 kg/m   Constitutional:  Alert and oriented, No acute distress. HEENT: [place] AT, moist mucus membranes.  Trachea midline, no masses. Cardiovascular: No clubbing, cyanosis, or edema. Respiratory: Normal respiratory effort, no increased work of breathing. GI: Abdomen is soft, nontender, nondistended, no abdominal masses.  Left inguinal hernia appreciated on Valsalva. GU: Prostate 50 g, smooth without nodules Lymph: No cervical or inguinal lymphadenopathy. Skin: No rashes, bruises or suspicious lesions. Neurologic: Grossly intact, no focal deficits, moving all 4 extremities. Psychiatric: Normal mood and affect.   Assessment & Plan:    - T1c low risk prostate cancer He desires to continue active surveillance and will follow-up in 6 months for PSA/DRE.  - Left inguinal hernia Minimally symptomatic.  General surgery referral should symptoms worsen.   Abbie Sons,  [place] [address] [place], [place] [address] [phone]

## 2019-08-25 ENCOUNTER — Encounter
Admission: RE | Admit: 2019-08-25 | Discharge: 2019-08-25 | Disposition: A | Discharge status: Home / Self Care | Payer: PPO | Source: Ambulatory Visit | Attending: Surgery | Admitting: Surgery

## 2019-08-25 ENCOUNTER — Other Ambulatory Visit: Payer: Self-pay

## 2019-08-25 DIAGNOSIS — Z01818 Encounter for other preprocedural examination: Secondary | ICD-10-CM | POA: Insufficient documentation

## 2019-08-25 DIAGNOSIS — Z01810 Encounter for preprocedural cardiovascular examination: Secondary | ICD-10-CM | POA: Diagnosis not present

## 2019-08-25 HISTORY — DX: Essential (primary) hypertension: I10

## 2019-08-25 LAB — BASIC METABOLIC PANEL
Anion gap: 10 (ref 5–15)
BUN: 13 mg/dL (ref 8–23)
CO2: 25 mmol/L (ref 22–32)
Calcium: 9.1 mg/dL (ref 8.9–10.3)
Chloride: 105 mmol/L (ref 98–111)
Creatinine, Ser: 0.82 mg/dL (ref 0.61–1.24)
GFR calc Af Amer: >60 mL/min (ref >60)
GFR calc non Af Amer: >60 mL/min (ref >60)
Glucose, Bld: 100 mg/dL — ABNORMAL HIGH (ref 70–99)
Potassium: 3.8 mmol/L (ref 3.5–5.1)
Sodium: 140 mmol/L (ref 135–145)

## 2019-08-25 LAB — CBC WITH DIFFERENTIAL/PLATELET
Abs Immature Granulocytes: 0.05 10*3/uL (ref 0.00–0.07)
Basophils Absolute: 0.1 10*3/uL (ref 0.0–0.1)
Basophils Relative: 1 %
Eosinophils Absolute: 0.2 10*3/uL (ref 0.0–0.5)
Eosinophils Relative: 2 %
HCT: 45 % (ref 39.0–52.0)
Hemoglobin: 15.1 g/dL (ref 13.0–17.0)
Immature Granulocytes: 1 %
Lymphocytes Relative: 23 %
Lymphs Abs: 2.1 10*3/uL (ref 0.7–4.0)
MCH: 29.1 pg (ref 26.0–34.0)
MCHC: 33.6 g/dL (ref 30.0–36.0)
MCV: 86.7 fL (ref 80.0–100.0)
Monocytes Absolute: 0.6 10*3/uL (ref 0.1–1.0)
Monocytes Relative: 7 %
Neutro Abs: 5.8 10*3/uL (ref 1.7–7.7)
Neutrophils Relative %: 66 %
Platelets: 230 10*3/uL (ref 150–400)
RBC: 5.19 MIL/uL (ref 4.22–5.81)
RDW: 13.7 % (ref 11.5–15.5)
WBC: 8.9 10*3/uL (ref 4.0–10.5)
nRBC: 0 % (ref 0.0–0.2)

## 2019-08-25 LAB — URINALYSIS, ROUTINE W REFLEX MICROSCOPIC
Bilirubin Urine: NEGATIVE
Glucose, UA: NEGATIVE mg/dL
Hgb urine dipstick: NEGATIVE
Ketones, ur: NEGATIVE mg/dL
Leukocytes,Ua: NEGATIVE
Nitrite: NEGATIVE
Protein, ur: NEGATIVE mg/dL
Specific Gravity, Urine: 1.01 (ref 1.005–1.030)
pH: 7 (ref 5.0–8.0)

## 2019-08-25 LAB — TYPE AND SCREEN
ABO/RH(D): O POS
Antibody Screen: NEGATIVE

## 2019-08-25 LAB — SURGICAL PCR SCREEN
MRSA, PCR: NEGATIVE
Staphylococcus aureus: NEGATIVE

## 2019-08-25 NOTE — Patient Instructions (Signed)
INSTRUCTIONS FOR SURGERY     Your surgery is scheduled for:   Tuesday, September 29,2020     To find out your arrival time for the day of surgery,          please call [phone] between 1 pm and 3 pm on :   Monday, August 31, 2019     When you arrive for surgery, report to the [facility].       Do NOT stop on the first floor to register.    REMEMBER: Instructions that are not followed completely may result in serious medical risk,  up to and including death, or upon the discretion of your surgeon and anesthesiologist,            your surgery may need to be rescheduled.  __X__ 1. Do not eat food after midnight the night before your procedure.                    No gum, candy, lozenger, tic tacs, tums or hard candies.                  ABSOLUTELY NOTHING SOLID IN YOUR MOUTH AFTER MIDNIGHT                    You may drink unlimited clear liquids up to 2 hours before you are scheduled to arrive for surgery.                   Do not drink anything within those 2 hours unless you need to take medicine, then take the                   smallest amount you need.  Clear liquids include:  water, apple juice without pulp,                   any flavor Gatorade, Black coffee, black tea.  Sugar may be added but no dairy/ honey /lemon.                        Broth and jello is not considered a clear liquid.  __x__  2. On the morning of surgery, please brush your teeth with toothpaste and water. You may rinse with                  mouthwash if you wish but DO NOT SWALLOW TOOTHPASTE OR MOUTHWASH  __X___3. NO alcohol for 24 hours before or after surgery.  __x___ 4.  Do NOT smoke or use e-cigarettes for 24 HOURS PRIOR TO SURGERY.                      DO NOT Use any chewable tobacco products for at least 6 hours prior to surgery.  __x___ 5. If you start any new medication after this appointment and prior to surgery,  please                   Bring it with you on the day of surgery.  ___x__ 6. Notify your doctor if there is any  change in your medical condition, such as fever, infection, vomitting,                   Diarrhea or any open sores.  __x___ 7.  USE the CHG SOAP as instructed, the night before surgery and the day of surgery.                   Once you have washed with this soap, do NOT use any of the following: Powders, perfumes                    or lotions. Please do not wear make up, hairpins, clips or nail polish. You MAY use deodorant                    prior to surgery.  DO NOT wear ANY jewelry on the day of surgery. If there are rings that are too tight to                    remove easily, please address this prior to the surgery day. Piercings need to be removed.                                                                     NO METAL ON YOUR BODY.                    Do NOT bring any valuables.  If you came to Pre-Admit testing then you will not need license,                     insurance card or credit card.  If you will be staying overnight, please either leave your things in                     the car or have your family be responsible for these items.                     [facility] IS NOT RESPONSIBLE FOR BELONGINGS OR VALUABLES.  ___X__ 8. DO NOT wear contact lenses on surgery day.  You may not have dentures,                     Hearing aides, contacts or glasses in the operating room. These items can be                    Placed in the Recovery Room to receive immediately after surgery.  __x___ 9. IF YOU ARE SCHEDULED TO GO HOME ON THE SAME DAY, YOU MUST                   Have someone to drive you home and to stay with you  for the first 24 hours.                    Have an arrangement prior to arriving on surgery day.  ___x__ 10. Take the following medications on the morning of surgery with a sip of water:  1.  none                     2.                      3.                     4.                    _____ 11.  Follow any instructions provided to you by your surgeon.                        Such as enema, clear liquid bowel prep  __X__  12. STOP  ASPIRIN AS OF:  TODAY                       THIS INCLUDES BC POWDERS / GOODIES POWDER  __x___ 13. STOP Anti-inflammatories as of: TODAY                      This includes IBUPROFEN / MOTRIN / ADVIL / ALEVE/ NAPROXYN / MOBIC                    YOU MAY TAKE TYLENOL ANY TIME PRIOR TO SURGERY.  ___X__ 51.  Stop supplements until after surgery.                     This includes:  FLAXSEED OIL // GINSENG // MAGNESIUM // SAW PALMETTO                  You may continue taking Vitamin B12 / Vitamin D3 AND MULTIVITS  but do not take on the morning of surgery.  __  __X__15.  Continue to take the following medications but do not take on the morning of surgery:                         ALL SUPPLEMENTS AND VITAMINS  ____X__16.  If staying overnight, please have appropriate shoes to wear to be able to walk around the unit.                   Wear clean and comfortable clothing to the hospital.  [place].  PLEASE BRING [employer] DIRECTIVES WITH YOU SO THAT WE MAY MAKE A COPY FOR YOUR CHART.  [place] PHONE AND A CHARGER TO [facility].

## 2019-08-26 LAB — URINE CULTURE: Culture: NO GROWTH

## 2019-08-28 ENCOUNTER — Other Ambulatory Visit: Payer: Self-pay

## 2019-08-28 ENCOUNTER — Other Ambulatory Visit
Admission: RE | Admit: 2019-08-28 | Discharge: 2019-08-28 | Disposition: A | Discharge status: Home / Self Care | Payer: PPO | Source: Ambulatory Visit | Attending: Surgery | Admitting: Surgery

## 2019-08-28 DIAGNOSIS — Z01812 Encounter for preprocedural laboratory examination: Secondary | ICD-10-CM | POA: Insufficient documentation

## 2019-08-28 DIAGNOSIS — Z20828 Contact with and (suspected) exposure to other viral communicable diseases: Secondary | ICD-10-CM | POA: Diagnosis not present

## 2019-08-28 LAB — SARS CORONAVIRUS 2 (TAT 6-24 HRS): SARS Coronavirus 2: NEGATIVE

## 2019-09-01 ENCOUNTER — Encounter
Admission: RE | Disposition: A | Discharge status: Home Health | Payer: Self-pay | Source: Home / Self Care | Attending: Surgery

## 2019-09-01 ENCOUNTER — Encounter: Payer: Self-pay | Admitting: *Deleted

## 2019-09-01 ENCOUNTER — Inpatient Hospital Stay
Admission: RE | Admit: 2019-09-01 | Discharge: 2019-09-02 | DRG: 470 | Disposition: A | Discharge status: Home Health | Payer: PPO | Attending: Surgery | Admitting: Surgery

## 2019-09-01 ENCOUNTER — Inpatient Hospital Stay: Payer: PPO | Admitting: Anesthesiology

## 2019-09-01 ENCOUNTER — Other Ambulatory Visit: Payer: Self-pay

## 2019-09-01 ENCOUNTER — Inpatient Hospital Stay: Payer: PPO

## 2019-09-01 DIAGNOSIS — I10 Essential (primary) hypertension: Secondary | ICD-10-CM | POA: Diagnosis present

## 2019-09-01 DIAGNOSIS — Z471 Aftercare following joint replacement surgery: Secondary | ICD-10-CM | POA: Diagnosis not present

## 2019-09-01 DIAGNOSIS — Z23 Encounter for immunization: Secondary | ICD-10-CM

## 2019-09-01 DIAGNOSIS — M23212 Derangement of anterior horn of medial meniscus due to old tear or injury, left knee: Secondary | ICD-10-CM | POA: Diagnosis present

## 2019-09-01 DIAGNOSIS — M1712 Unilateral primary osteoarthritis, left knee: Principal | ICD-10-CM | POA: Diagnosis present

## 2019-09-01 DIAGNOSIS — Z96652 Presence of left artificial knee joint: Secondary | ICD-10-CM | POA: Diagnosis not present

## 2019-09-01 DIAGNOSIS — Z87891 Personal history of nicotine dependence: Secondary | ICD-10-CM | POA: Diagnosis not present

## 2019-09-01 DIAGNOSIS — Z96651 Presence of right artificial knee joint: Secondary | ICD-10-CM | POA: Diagnosis not present

## 2019-09-01 DIAGNOSIS — Z8546 Personal history of malignant neoplasm of prostate: Secondary | ICD-10-CM | POA: Diagnosis not present

## 2019-09-01 DIAGNOSIS — M23204 Derangement of unspecified medial meniscus due to old tear or injury, left knee: Secondary | ICD-10-CM | POA: Diagnosis not present

## 2019-09-01 DIAGNOSIS — M25562 Pain in left knee: Secondary | ICD-10-CM | POA: Diagnosis not present

## 2019-09-01 DIAGNOSIS — M1732 Unilateral post-traumatic osteoarthritis, left knee: Secondary | ICD-10-CM | POA: Diagnosis not present

## 2019-09-01 HISTORY — PX: PARTIAL KNEE ARTHROPLASTY: SHX2174

## 2019-09-01 SURGERY — ARTHROPLASTY, KNEE, UNICOMPARTMENTAL
Anesthesia: Spinal | Site: Knee | Laterality: Left

## 2019-09-01 MED ORDER — ENOXAPARIN SODIUM 40 MG/0.4ML ~~LOC~~ SOLN
40.0000 mg | SUBCUTANEOUS | Status: DC
  Administered 2019-09-02: 40 mg via SUBCUTANEOUS
  Filled 2019-09-01: qty 0.4

## 2019-09-01 MED ORDER — MIDAZOLAM HCL 5 MG/5ML IJ SOLN
INTRAMUSCULAR | Status: DC | PRN
  Administered 2019-09-01: 2 mg via INTRAVENOUS

## 2019-09-01 MED ORDER — FLEET ENEMA 7-19 GM/118ML RE ENEM: 1.0000 | ENEMA | Freq: Once | RECTAL | Status: DC | PRN

## 2019-09-01 MED ORDER — METOCLOPRAMIDE HCL 5 MG/ML IJ SOLN: 5.0000 mg | Freq: Three times a day (TID) | INTRAMUSCULAR | Status: DC | PRN

## 2019-09-01 MED ORDER — FLAXSEED OIL 1200 MG PO CAPS: 1200.0000 mg | ORAL_CAPSULE | Freq: Two times a day (BID) | ORAL | Status: DC

## 2019-09-01 MED ORDER — LACTATED RINGERS IV SOLN
INTRAVENOUS | Status: DC
  Administered 2019-09-01: 10:00:00 via INTRAVENOUS

## 2019-09-01 MED ORDER — GLYCOPYRROLATE 0.2 MG/ML IJ SOLN
INTRAMUSCULAR | Status: AC
  Filled 2019-09-01: qty 1

## 2019-09-01 MED ORDER — DIPHENHYDRAMINE HCL 12.5 MG/5ML PO ELIX
12.5000 mg | ORAL_SOLUTION | ORAL | Status: DC | PRN
  Administered 2019-09-01: 12.5 mg via ORAL
  Filled 2019-09-01: qty 5

## 2019-09-01 MED ORDER — ONDANSETRON HCL 4 MG/2ML IJ SOLN: 4.0000 mg | Freq: Four times a day (QID) | INTRAMUSCULAR | Status: DC | PRN

## 2019-09-01 MED ORDER — HYDROMORPHONE HCL 1 MG/ML IJ SOLN: 0.2500 mg | INTRAMUSCULAR | Status: DC | PRN

## 2019-09-01 MED ORDER — ACETAMINOPHEN 10 MG/ML IV SOLN
INTRAVENOUS | Status: AC
  Filled 2019-09-01: qty 100

## 2019-09-01 MED ORDER — CEFAZOLIN SODIUM-DEXTROSE 2-4 GM/100ML-% IV SOLN
2.0000 g | Freq: Four times a day (QID) | INTRAVENOUS | Status: AC
  Administered 2019-09-01 – 2019-09-02 (×3): 2 g via INTRAVENOUS
  Filled 2019-09-01 (×3): qty 100

## 2019-09-01 MED ORDER — KETOROLAC TROMETHAMINE 15 MG/ML IJ SOLN
15.0000 mg | Freq: Four times a day (QID) | INTRAMUSCULAR | Status: DC
  Administered 2019-09-01 – 2019-09-02 (×3): 15 mg via INTRAVENOUS
  Filled 2019-09-01 (×3): qty 1

## 2019-09-01 MED ORDER — PROPOFOL 10 MG/ML IV BOLUS
INTRAVENOUS | Status: DC | PRN
  Administered 2019-09-01 (×3): 16 mg via INTRAVENOUS

## 2019-09-01 MED ORDER — METOCLOPRAMIDE HCL 10 MG PO TABS: 5.0000 mg | ORAL_TABLET | Freq: Three times a day (TID) | ORAL | Status: DC | PRN

## 2019-09-01 MED ORDER — ACETAMINOPHEN 10 MG/ML IV SOLN
INTRAVENOUS | Status: DC | PRN
  Administered 2019-09-01: 1000 mg via INTRAVENOUS

## 2019-09-01 MED ORDER — BUPIVACAINE HCL (PF) 0.5 % IJ SOLN
INTRAMUSCULAR | Status: AC
  Filled 2019-09-01: qty 10

## 2019-09-01 MED ORDER — SAW PALMETTO 80 MG PO CAPS: ORAL_CAPSULE | Freq: Every day | ORAL | Status: DC

## 2019-09-01 MED ORDER — ACETAMINOPHEN 500 MG PO TABS
1000.0000 mg | ORAL_TABLET | Freq: Four times a day (QID) | ORAL | Status: AC
  Administered 2019-09-01 – 2019-09-02 (×4): 1000 mg via ORAL
  Filled 2019-09-01 (×4): qty 2

## 2019-09-01 MED ORDER — SODIUM CHLORIDE FLUSH 0.9 % IV SOLN
INTRAVENOUS | Status: AC
  Filled 2019-09-01: qty 40

## 2019-09-01 MED ORDER — SODIUM CHLORIDE 0.9 % IV SOLN
INTRAVENOUS | Status: DC
  Administered 2019-09-01 – 2019-09-02 (×2): via INTRAVENOUS

## 2019-09-01 MED ORDER — MAGNESIUM OXIDE 400 (241.3 MG) MG PO TABS
200.0000 mg | ORAL_TABLET | Freq: Every day | ORAL | Status: DC
  Administered 2019-09-02: 200 mg via ORAL
  Filled 2019-09-01: qty 1

## 2019-09-01 MED ORDER — MAGNESIUM HYDROXIDE 400 MG/5ML PO SUSP
30.0000 mL | Freq: Every day | ORAL | Status: DC | PRN
  Administered 2019-09-02: 30 mL via ORAL
  Filled 2019-09-01: qty 30

## 2019-09-01 MED ORDER — SODIUM CHLORIDE 0.9 % IV SOLN
INTRAVENOUS | Status: DC | PRN
  Administered 2019-09-01: 60 mL

## 2019-09-01 MED ORDER — PROPOFOL 500 MG/50ML IV EMUL
INTRAVENOUS | Status: DC | PRN
  Administered 2019-09-01: 50 ug/kg/min via INTRAVENOUS

## 2019-09-01 MED ORDER — GINSENG 100 MG PO CAPS: 100.0000 mg | ORAL_CAPSULE | Freq: Every day | ORAL | Status: DC | PRN

## 2019-09-01 MED ORDER — MIDAZOLAM HCL 2 MG/2ML IJ SOLN
INTRAMUSCULAR | Status: AC
  Filled 2019-09-01: qty 2

## 2019-09-01 MED ORDER — TRANEXAMIC ACID 1000 MG/10ML IV SOLN
INTRAVENOUS | Status: DC | PRN
  Administered 2019-09-01: 1000 mg via TOPICAL

## 2019-09-01 MED ORDER — ADULT MULTIVITAMIN W/MINERALS CH
1.0000 | ORAL_TABLET | Freq: Every day | ORAL | Status: DC
  Administered 2019-09-02: 1 via ORAL
  Filled 2019-09-01: qty 1

## 2019-09-01 MED ORDER — BISACODYL 10 MG RE SUPP: 10.0000 mg | Freq: Every day | RECTAL | Status: DC | PRN

## 2019-09-01 MED ORDER — TRANEXAMIC ACID 1000 MG/10ML IV SOLN
INTRAVENOUS | Status: AC
  Filled 2019-09-01: qty 10

## 2019-09-01 MED ORDER — KETOROLAC TROMETHAMINE 30 MG/ML IJ SOLN
30.0000 mg | Freq: Once | INTRAMUSCULAR | Status: AC
  Administered 2019-09-01: 30 mg via INTRAVENOUS

## 2019-09-01 MED ORDER — CEFAZOLIN SODIUM-DEXTROSE 2-4 GM/100ML-% IV SOLN
INTRAVENOUS | Status: AC
  Filled 2019-09-01: qty 100

## 2019-09-01 MED ORDER — BUPIVACAINE-EPINEPHRINE (PF) 0.5% -1:200000 IJ SOLN
INTRAMUSCULAR | Status: DC | PRN
  Administered 2019-09-01: 30 mL

## 2019-09-01 MED ORDER — ONDANSETRON HCL 4 MG/2ML IJ SOLN: 4.0000 mg | Freq: Once | INTRAMUSCULAR | Status: DC | PRN

## 2019-09-01 MED ORDER — BUPIVACAINE LIPOSOME 1.3 % IJ SUSP
INTRAMUSCULAR | Status: AC
  Filled 2019-09-01: qty 20

## 2019-09-01 MED ORDER — DOCUSATE SODIUM 100 MG PO CAPS
100.0000 mg | ORAL_CAPSULE | Freq: Two times a day (BID) | ORAL | Status: DC
  Administered 2019-09-01 – 2019-09-02 (×2): 100 mg via ORAL
  Filled 2019-09-01 (×2): qty 1

## 2019-09-01 MED ORDER — INFLUENZA VAC A&B SA ADJ QUAD 0.5 ML IM PRSY
0.5000 mL | PREFILLED_SYRINGE | INTRAMUSCULAR | Status: AC | PRN
  Administered 2019-09-02: 0.5 mL via INTRAMUSCULAR
  Filled 2019-09-01: qty 0.5

## 2019-09-01 MED ORDER — FAMOTIDINE 20 MG PO TABS
ORAL_TABLET | ORAL | Status: AC
  Filled 2019-09-01: qty 1

## 2019-09-01 MED ORDER — BUPIVACAINE HCL (PF) 0.5 % IJ SOLN
INTRAMUSCULAR | Status: DC | PRN
  Administered 2019-09-01: 3 mL

## 2019-09-01 MED ORDER — CEFAZOLIN SODIUM-DEXTROSE 2-4 GM/100ML-% IV SOLN
2.0000 g | Freq: Once | INTRAVENOUS | Status: AC
  Administered 2019-09-01: 2 g via INTRAVENOUS

## 2019-09-01 MED ORDER — KETOROLAC TROMETHAMINE 30 MG/ML IJ SOLN
INTRAMUSCULAR | Status: AC
  Administered 2019-09-01: 14:00:00
  Filled 2019-09-01: qty 1

## 2019-09-01 MED ORDER — PROPOFOL 500 MG/50ML IV EMUL
INTRAVENOUS | Status: AC
  Filled 2019-09-01: qty 50

## 2019-09-01 MED ORDER — FENTANYL CITRATE (PF) 100 MCG/2ML IJ SOLN: 25.0000 ug | INTRAMUSCULAR | Status: DC | PRN

## 2019-09-01 MED ORDER — PSYLLIUM 95 % PO PACK
1.0000 | PACK | Freq: Two times a day (BID) | ORAL | Status: DC
  Administered 2019-09-02: 1 via ORAL
  Filled 2019-09-01 (×3): qty 1

## 2019-09-01 MED ORDER — LIDOCAINE HCL (PF) 2 % IJ SOLN
INTRAMUSCULAR | Status: AC
  Filled 2019-09-01: qty 10

## 2019-09-01 MED ORDER — BUPIVACAINE-EPINEPHRINE (PF) 0.5% -1:200000 IJ SOLN
INTRAMUSCULAR | Status: AC
  Filled 2019-09-01: qty 30

## 2019-09-01 MED ORDER — TRAMADOL HCL 50 MG PO TABS
50.0000 mg | ORAL_TABLET | Freq: Four times a day (QID) | ORAL | Status: DC
  Administered 2019-09-01 – 2019-09-02 (×4): 50 mg via ORAL
  Filled 2019-09-01 (×4): qty 1

## 2019-09-01 MED ORDER — ACETAMINOPHEN 325 MG PO TABS: 325.0000 mg | ORAL_TABLET | Freq: Four times a day (QID) | ORAL | Status: DC | PRN

## 2019-09-01 MED ORDER — FAMOTIDINE 20 MG PO TABS
20.0000 mg | ORAL_TABLET | Freq: Once | ORAL | Status: AC
  Administered 2019-09-01: 20 mg via ORAL

## 2019-09-01 MED ORDER — FENTANYL CITRATE (PF) 100 MCG/2ML IJ SOLN
INTRAMUSCULAR | Status: AC
  Filled 2019-09-01: qty 2

## 2019-09-01 MED ORDER — OXYCODONE HCL 5 MG PO TABS
5.0000 mg | ORAL_TABLET | ORAL | Status: DC | PRN
  Administered 2019-09-01 – 2019-09-02 (×2): 5 mg via ORAL
  Filled 2019-09-01 (×2): qty 1

## 2019-09-01 MED ORDER — PNEUMOCOCCAL VAC POLYVALENT 25 MCG/0.5ML IJ INJ: 0.5000 mL | INJECTION | INTRAMUSCULAR | Status: DC | PRN

## 2019-09-01 MED ORDER — SODIUM CHLORIDE 0.9 % IV SOLN
INTRAVENOUS | Status: DC | PRN
  Administered 2019-09-01: 30 ug/min via INTRAVENOUS

## 2019-09-01 MED ORDER — B COMPLEX-C PO TABS
1.0000 | ORAL_TABLET | Freq: Every day | ORAL | Status: DC
  Filled 2019-09-01: qty 1

## 2019-09-01 MED ORDER — ONDANSETRON HCL 4 MG PO TABS: 4.0000 mg | ORAL_TABLET | Freq: Four times a day (QID) | ORAL | Status: DC | PRN

## 2019-09-01 MED ORDER — FENTANYL CITRATE (PF) 100 MCG/2ML IJ SOLN
INTRAMUSCULAR | Status: DC | PRN
  Administered 2019-09-01 (×2): 50 ug via INTRAVENOUS

## 2019-09-01 MED ORDER — TRAZODONE HCL 50 MG PO TABS: 25.0000 mg | ORAL_TABLET | Freq: Every evening | ORAL | Status: DC | PRN

## 2019-09-01 SURGICAL SUPPLY — 64 items
BEARING MENISCAL TIBIAL 6 MD L (Orthopedic Implant) ×2 IMPLANT
BNDG ELASTIC 6X5.8 VLCR STR LF (GAUZE/BANDAGES/DRESSINGS) ×2 IMPLANT
CANISTER SUCT 1200ML W/VALVE (MISCELLANEOUS) ×2 IMPLANT
CANISTER SUCT 3000ML PPV (MISCELLANEOUS) ×2 IMPLANT
CEMENT BONE R 1X40 (Cement) ×2 IMPLANT
CEMENT VACUUM MIXING SYSTEM (MISCELLANEOUS) ×2 IMPLANT
CHLORAPREP W/TINT 26 (MISCELLANEOUS) ×4 IMPLANT
COOLER POLAR GLACIER W/PUMP (MISCELLANEOUS) ×2 IMPLANT
COVER MAYO STAND REUSABLE (DRAPES) ×2 IMPLANT
COVER WAND RF STERILE (DRAPES) ×2 IMPLANT
CUFF TOURN SGL QUICK 24 (TOURNIQUET CUFF) ×1
CUFF TOURN SGL QUICK 30 (TOURNIQUET CUFF)
CUFF TRNQT CYL 24X4X16.5-23 (TOURNIQUET CUFF) ×1 IMPLANT
CUFF TRNQT CYL 30X4X21-28X (TOURNIQUET CUFF) IMPLANT
DRAPE C-ARM XRAY 36X54 (DRAPES) IMPLANT
DRSG OPSITE POSTOP 4X12 (GAUZE/BANDAGES/DRESSINGS) ×2 IMPLANT
DRSG OPSITE POSTOP 4X14 (GAUZE/BANDAGES/DRESSINGS) ×2 IMPLANT
DRSG OPSITE POSTOP 4X6 (GAUZE/BANDAGES/DRESSINGS) ×2 IMPLANT
ELECT CAUTERY BLADE 6.4 (BLADE) ×2 IMPLANT
ELECT REM PT RETURN 9FT ADLT (ELECTROSURGICAL) ×2
ELECTRODE REM PT RTRN 9FT ADLT (ELECTROSURGICAL) ×1 IMPLANT
GAUZE 4X4 16PLY RFD (DISPOSABLE) ×2 IMPLANT
GAUZE SPONGE 4X4 12PLY STRL (GAUZE/BANDAGES/DRESSINGS) ×2 IMPLANT
GAUZE XEROFORM 1X8 LF (GAUZE/BANDAGES/DRESSINGS) ×2 IMPLANT
GLOVE BIO SURGEON STRL SZ7.5 (GLOVE) ×8 IMPLANT
GLOVE BIO SURGEON STRL SZ8 (GLOVE) ×8 IMPLANT
GLOVE BIOGEL PI IND STRL 8 (GLOVE) ×1 IMPLANT
GLOVE BIOGEL PI INDICATOR 8 (GLOVE) ×1
GLOVE INDICATOR 8.0 STRL GRN (GLOVE) ×2 IMPLANT
GOWN STRL REUS W/ TWL LRG LVL3 (GOWN DISPOSABLE) ×1 IMPLANT
GOWN STRL REUS W/ TWL XL LVL3 (GOWN DISPOSABLE) ×1 IMPLANT
GOWN STRL REUS W/TWL LRG LVL3 (GOWN DISPOSABLE) ×1
GOWN STRL REUS W/TWL XL LVL3 (GOWN DISPOSABLE) ×1
HOLDER FOLEY CATH W/STRAP (MISCELLANEOUS) ×2 IMPLANT
HOOD PEEL AWAY FLYTE STAYCOOL (MISCELLANEOUS) ×6 IMPLANT
KIT TURNOVER KIT A (KITS) ×2 IMPLANT
MAT ABSORB  FLUID 56X50 GRAY (MISCELLANEOUS) ×1
MAT ABSORB FLUID 56X50 GRAY (MISCELLANEOUS) ×1 IMPLANT
NDL SAFETY ECLIPSE 18X1.5 (NEEDLE) ×1 IMPLANT
NEEDLE HYPO 18GX1.5 SHARP (NEEDLE) ×1
NEEDLE SPNL 20GX3.5 QUINCKE YW (NEEDLE) ×2 IMPLANT
NS IRRIG 1000ML POUR BTL (IV SOLUTION) ×2 IMPLANT
PACK BLADE SAW RECIP 70 3 PT (BLADE) ×2 IMPLANT
PACK TOTAL KNEE (MISCELLANEOUS) ×2 IMPLANT
PAD WRAPON POLAR KNEE (MISCELLANEOUS) ×1 IMPLANT
PEG TWIN FEM CEMENTED MED (Knees) ×2 IMPLANT
PULSAVAC PLUS IRRIG FAN TIP (DISPOSABLE) ×2
SOL .9 NS 3000ML IRR  AL (IV SOLUTION) ×1
SOL .9 NS 3000ML IRR UROMATIC (IV SOLUTION) ×1 IMPLANT
STAPLER SKIN PROX 35W (STAPLE) ×2 IMPLANT
STRAP SAFETY 5IN WIDE (MISCELLANEOUS) ×2 IMPLANT
SUCTION FRAZIER HANDLE 10FR (MISCELLANEOUS) ×1
SUCTION TUBE FRAZIER 10FR DISP (MISCELLANEOUS) ×1 IMPLANT
SUT VIC AB 0 CT1 36 (SUTURE) ×2 IMPLANT
SUT VIC AB 2-0 CT1 27 (SUTURE) ×4
SUT VIC AB 2-0 CT1 TAPERPNT 27 (SUTURE) ×4 IMPLANT
SYR 10ML LL (SYRINGE) ×2 IMPLANT
SYR 20ML LL LF (SYRINGE) ×2 IMPLANT
SYR 30ML LL (SYRINGE) ×6 IMPLANT
TAPE TRANSPORE STRL 2 31045 (GAUZE/BANDAGES/DRESSINGS) ×2 IMPLANT
TIP FAN IRRIG PULSAVAC PLUS (DISPOSABLE) ×1 IMPLANT
TRAY FOLEY MTR SLVR 16FR STAT (SET/KITS/TRAYS/PACK) ×2 IMPLANT
TRAY TIB SZF LT MEDIAL KNEE (Knees) ×2 IMPLANT
WRAPON POLAR PAD KNEE (MISCELLANEOUS) ×2

## 2019-09-01 NOTE — H&P (Signed)
Paper H&P to be scanned into permanent record. H&P reviewed and patient re-examined. No changes. 

## 2019-09-01 NOTE — Progress Notes (Signed)
PHARMACIST - PHYSICIAN ORDER COMMUNICATION  CONCERNING: P&T Medication Policy on Herbal Medications  DESCRIPTION:  This patient's order for:  Flaxseed oil, Ginseng, and Saw Palmetto  has been noted.  This product(s) is classified as an "herbal" or natural product. Due to a lack of definitive safety studies or FDA approval, nonstandard manufacturing practices, plus the potential risk of unknown drug-drug interactions while on inpatient medications, the Pharmacy and Therapeutics Committee does not permit the use of "herbal" or natural products of this type within [facility].   ACTION TAKEN: The pharmacy department is unable to verify this order at this time and your patient has been informed of this safety policy. Please reevaluate patient's clinical condition at discharge and address if the herbal or natural product(s) should be resumed at that time.

## 2019-09-01 NOTE — Progress Notes (Signed)
PT Cancellation Note  Patient Details Name: Adrian Hart MRN: [id number] DOB: 07-Jun-1951   Cancelled Treatment:    Reason Eval/Treat Not Completed: Patient not medically ready. No protective sensation to light touch in LLE. Will attempt to see pt at a future date/time as medically appropriate.   Jair Lindblad "Gus" Jeannette Corpus, SPT  09/01/19, 4:56 PM

## 2019-09-01 NOTE — Anesthesia Procedure Notes (Signed)
Spinal  Patient location during procedure: OR Start time: 09/01/2019 10:28 AM End time: 09/01/2019 10:30 AM Staffing Resident/CRNA: Bernardo Heater, CRNA Performed: resident/CRNA  Preanesthetic Checklist Completed: patient identified, site marked, surgical consent, pre-op evaluation, timeout performed, IV checked, risks and benefits discussed and monitors and equipment checked Spinal Block Patient position: sitting Prep: ChloraPrep Patient monitoring: heart rate, continuous pulse ox, blood pressure and cardiac monitor Approach: midline Location: L3-4 Injection technique: single-shot Needle Needle type: Introducer and Pencan  Needle gauge: 24 G Needle length: 9 cm Additional Notes Negative paresthesia. Negative blood return. Positive free-flowing CSF. Expiration date of kit checked and confirmed. Patient tolerated procedure well, without complications.

## 2019-09-01 NOTE — Anesthesia Preprocedure Evaluation (Signed)
Anesthesia Evaluation  Patient identified by MRN, date of birth, ID band Patient awake    Reviewed: Allergy & Precautions, NPO status , Patient's Chart, lab work & pertinent test results  History of Anesthesia Complications Negative for: history of anesthetic complications  Airway Mallampati: II       Dental   Pulmonary neg sleep apnea, neg COPD, former smoker,           Cardiovascular hypertension (borderline, no meds), (-) Past MI and (-) CHF (-) dysrhythmias (-) Valvular Problems/Murmurs     Neuro/Psych neg Seizures    GI/Hepatic Neg liver ROS, GERD (no meds now)  ,  Endo/Other  neg diabetes  Renal/GU negative Renal ROS     Musculoskeletal   Abdominal   Peds  Hematology   Anesthesia Other Findings   Reproductive/Obstetrics                             Anesthesia Physical Anesthesia Plan  ASA: II  Anesthesia Plan: Spinal   Post-op Pain Management:    Induction:   PONV Risk Score and Plan:   Airway Management Planned:   Additional Equipment:   Intra-op Plan:   Post-operative Plan:   Informed Consent: I have reviewed the patients History and Physical, chart, labs and discussed the procedure including the risks, benefits and alternatives for the proposed anesthesia with the patient or authorized representative who has indicated his/her understanding and acceptance.       Plan Discussed with:   Anesthesia Plan Comments:         Anesthesia Quick Evaluation

## 2019-09-01 NOTE — Op Note (Signed)
09/01/2019  12:31 PM  Patient:   Adrian Hart  Pre-Op Diagnosis:   Osteoarthritis of medial compartment, left knee.  Post-Op Diagnosis:   Same  Procedure:   Left unicondylar knee arthroplasty.  Surgeon:   Pascal Lux, MD  Assistant:   Cameron Proud, PA-C; Orland Penman, PA-S  Anesthesia:   Spinal  Findings:   As above.  Complications:   None  EBL:   10 cc  Fluids:   750 cc crystalloid  UOP:   200 cc  TT:   80 minutes at 300 mmHg  Drains:   None  Closure:   Staples  Implants:   All-cemented Biomet Oxford system with a medium femoral component, an "F" sized tibial tray, and a 6 mm meniscal bearing insert.  Brief Clinical Note:   The patient is a 68 year old male with a long history of progressively worsening medial sided left knee pain. His symptoms have progressed despite medications, activity modification, etc. His history and examination are consistent with degenerative joint disease of the left knee, primarily involving the medial compartment. The patient presents at this time for a left partial knee replacement.  Procedure:   The patient was brought into the operating room and a spinal placed by the anesthesiologist. The patient was lain in the supine position and a Foley catheter inserted. The patient was repositioned so that the non-surgical leg was placed in a flexed and abducted position in the yellow fin leg holder while the surgical extremity was placed over the Biomet leg holder. The left lower extremity was prepped with ChloraPrep solution before being draped sterilely. Preoperative antibiotics were administered. After performing a timeout to verify the appropriate surgical site, the limb was exsanguinated with an Esmarch and the tourniquet inflated to 300 mmHg.   A standard anterior approach to the knee was made through an approximately 3.5-4 inch incision. The incision was carried down through the subcutaneous tissues to expose the superficial retinaculum. This  was split the length the incision and medial flap elevated sufficiently to expose the medial retinaculum. This was incised along the medial border of the patella tendon and extended proximally along the medial border of the patella, leaving a 3-4 mm cuff of tissue. The soft tissues were elevated off the anteromedial aspect of the proximal tibia. The anterior portion of the meniscus was removed after performing a subtotal excision of the infrapatellar fat pad. The anterior cruciate ligament was inspected and found to be in excellent condition. Osteophytes were removed from the inferior pole of the patella as well as from the notch using a quarter-inch osteotome. There were significant degenerative changes of both the femur and tibia on the medial side.   The medial femoral condyle was sized using the large and medium sizers. It was felt that the medium guide best optimized the contour of the femur. This was left in place and the external tibial guide positioned. The coupling device was used to connect the guide to the medial femoral condylar sizer to optimize appropriate orientation. Two guide pins were inserted into the cutting block before the coupling device and sizer were removed. The appropriate tibial cut was made using the oscillating and reciprocating saws. The piece was removed in its entirety and taken to the back table where it was sized and found to be optimally replicated by an "F" sized component. The 9 mm spacer was inserted to verify that sufficient bone had been removed.  Attention was directed to femoral side. The intramedullary canal was accessed  through a 4 mm drill hole. The intramedullary guide was positioned before the guide for the femoral condylar holes was positioned. The appropriate coupling device connected this guide to the intramedullary guide before both drill holes were created in the distal aspect of the medial femoral condyle. The devices were removed and the posterior condylar  cutting block inserted. The appropriate cut was made using the reciprocating saw and this piece removed. The #0 spigot was inserted and the initial bone milling performed. A trial femoral component was inserted and both the flexion and extension gaps measured. In flexion, the gap measured 9 mm whereas in extension, it measured 5 mm. Therefore, the #4 spigot was selected and the secondary bone milling performed. Repeat sizing demonstrated symmetric flexion and extension gaps. The bone was removed from the postero-medial and postero-lateral aspects of the femoral condyle, as well as from the beneath the collar of the spigot. Bone also was removed from the anterior portion of the femur so as to minimize any potential impingement with the meniscal bearing insert. The trial components removed and several drill holes placed into the distal femoral condyle to further augment cement fixation.  Attention was redirected to the tibial side. The "F" sized tibial tray was positioned and temporarily secured using the appropriate spiked nail. The keel was created using the bi-bladed reciprocating saw and hoe. The keeled "F" sized trial tibial tray was inserted to be sure that it seated properly. At this point, a total of 20 cc of Exparel diluted out to 60 cc with normal saline and 30 cc of 0.5% Sensorcaine was injected in and around the posterior and medial capsular tissues, as well as the peri-incisional tissues to help with postoperative pain control.  The bony surfaces were prepared for cementing by irrigating them thoroughly with bacitracin saline solution using the jet lavage system before packing them with a dry Ray-Tec sponge. Meanwhile, cement was being mixed on the back table. When the cement was ready, the tibial tray was cemented in first. The excess cement was removed using a Surveyor, quantity after impacting it into place. Next, the femoral component was impacted into place. Again the excess cement was removed  using a Surveyor, quantity. The 6 mm spacer was inserted and the knee brought into near full extension while the cement hardened. Once the cement hardened, the spacer was removed and the 6 mm meniscal bearing insert was trialed. This demonstrated excellent tracking while the knee was placed through a range of motion, and showed no evidence towards subluxation or dislocation. In addition, it did not fit too tightly. Therefore, the permanent 6 mm meniscal bearing insert was snapped into position after verifying that no cement had been retained posteriorly. Again the knee was placed through a range of motion with the findings as described above.  The wound was copiously irrigated with bacitracin saline solution via the jet lavage system before the retinacular layer was reapproximated using #0 Vicryl interrupted sutures. At this point, 1 g of transexemic acid in 10 cc of normal saline was injected intra-articularly. The subcutaneous tissues were closed in two layers using 2-0 Vicryl interrupted sutures before the skin was closed using staples. A sterile occlusive dressing was applied to the knee before the patient was awakened. The patient was transferred back to his/her hospital bed and returned to the recovery room in satisfactory condition after tolerating the procedure well. A Polar Care device was applied to the knee as well.

## 2019-09-01 NOTE — Progress Notes (Signed)
PHARMACIST - PHYSICIAN ORDER COMMUNICATION  CONCERNING: P&T Medication Policy on Herbal Medications  DESCRIPTION:  This patient's order for:  Saw Palmetto Caps,  Flaxseed Oil Caps, and Ginseng 100mg  Caps  has been noted.  This product(s) is classified as an "herbal" or natural product. Due to a lack of definitive safety studies or FDA approval, nonstandard manufacturing practices, plus the potential risk of unknown drug-drug interactions while on inpatient medications, the Pharmacy and Therapeutics Committee does not permit the use of "herbal" or natural products of this type within [facility].   ACTION TAKEN: The pharmacy department is unable to verify this order at this time. Please reevaluate patient's clinical condition at discharge and address if the herbal or natural product(s) should be resumed at that time.  Pernell Dupre, PharmD, BCPS Clinical Pharmacist 09/01/2019 3:00 PM

## 2019-09-01 NOTE — Progress Notes (Addendum)
PT alerted this writer to access drainage of patient. Small amount of drainage. Bed sheet changed. Will continue to monitor

## 2019-09-01 NOTE — Transfer of Care (Signed)
Immediate Anesthesia Transfer of Care Note  Patient: Adrian Hart  Procedure(s) Performed: UNICOMPARTMENTAL KNEE (Left Knee)  Patient Location: PACU  Anesthesia Type:Spinal  Level of Consciousness: awake, alert  and oriented  Airway & Oxygen Therapy: Patient Spontanous Breathing and Patient connected to nasal cannula oxygen  Post-op Assessment: Report given to RN and Post -op Vital signs reviewed and stable  Post vital signs: Reviewed and stable  Last Vitals:  Vitals Value Taken Time  BP    Temp    Pulse 66 09/01/19 1244  Resp 15 09/01/19 1244  SpO2 98 % 09/01/19 1244  Vitals shown include unvalidated device data.  Last Pain:  Vitals:   09/01/19 0949  TempSrc: Tympanic  PainSc: 0-No pain      Patients Stated Pain Goal: 0 ([id number])  Complications: No apparent anesthesia complications

## 2019-09-01 NOTE — Anesthesia Post-op Follow-up Note (Signed)
Anesthesia QCDR form completed.        

## 2019-09-01 NOTE — Progress Notes (Signed)
Pt refused CPM at this time

## 2019-09-02 ENCOUNTER — Encounter: Payer: Self-pay | Admitting: Surgery

## 2019-09-02 LAB — BASIC METABOLIC PANEL
Anion gap: 9 (ref 5–15)
BUN: 18 mg/dL (ref 8–23)
CO2: 24 mmol/L (ref 22–32)
Calcium: 8.1 mg/dL — ABNORMAL LOW (ref 8.9–10.3)
Chloride: 106 mmol/L (ref 98–111)
Creatinine, Ser: 0.79 mg/dL (ref 0.61–1.24)
GFR calc Af Amer: >60 mL/min (ref >60)
GFR calc non Af Amer: >60 mL/min (ref >60)
Glucose, Bld: 105 mg/dL — ABNORMAL HIGH (ref 70–99)
Potassium: 3.8 mmol/L (ref 3.5–5.1)
Sodium: 139 mmol/L (ref 135–145)

## 2019-09-02 MED ORDER — OXYCODONE HCL 5 MG PO TABS: 5.0000 mg | ORAL_TABLET | ORAL | 0 refills | Status: DC | PRN

## 2019-09-02 MED ORDER — TRAMADOL HCL 50 MG PO TABS: 50.0000 mg | ORAL_TABLET | Freq: Four times a day (QID) | ORAL | 0 refills | Status: DC

## 2019-09-02 MED ORDER — ENOXAPARIN SODIUM 40 MG/0.4ML ~~LOC~~ SOLN: 40.0000 mg | SUBCUTANEOUS | 0 refills | Status: DC

## 2019-09-02 NOTE — Progress Notes (Signed)
Subjective: 1 Day Post-Op Procedure(s) (LRB): UNICOMPARTMENTAL KNEE (Left) Patient reports pain as mild.   Patient is well, and has had no acute complaints or problems Plan is to go Home after hospital stay. Negative for chest pain and shortness of breath Fever: no Gastrointestinal:Negative for nausea and vomiting He was unable to work with PT yesterday due to lack of sensation following epidural injection prior to surgery.  Objective: Vital signs in last 24 hours: Temp:  [96.6 F (35.9 C)-98.7 F (37.1 C)] 97.8 F (36.6 C) (09/30 0753) Pulse Rate:  [59-76] 67 (09/30 0753) Resp:  [13-25] 16 (09/30 0419) BP: (114-143)/(68-87) 132/82 (09/30 0753) SpO2:  [93 %-100 %] 98 % (09/30 0753) Weight:  [90.9 kg] 90.9 kg (09/29 0949)  Intake/Output from previous day:  Intake/Output Summary (Last 24 hours) at 09/02/2019 0753 Last data filed at 09/02/2019 0729 Gross per 24 hour  Intake 1416.81 ml  Output 1360 ml  Net 56.81 ml    Intake/Output this shift: Total I/O In: -  Out: 250 [Urine:250]  Labs: No results for input(s): HGB in the last 72 hours. No results for input(s): WBC, RBC, HCT, PLT in the last 72 hours. Recent Labs    09/02/19 0555  NA 139  K 3.8  CL 106  CO2 24  BUN 18  CREATININE 0.79  GLUCOSE 105*  CALCIUM 8.1*   No results for input(s): LABPT, INR in the last 72 hours.  EXAM General - Patient is Alert, Appropriate and Oriented Extremity - ABD soft Neurovascular intact Sensation intact distally Dorsiflexion/Plantar flexion intact Incision: moderate drainage No cellulitis present Dressing/Incision - blood tinged drainage Motor Function - intact, moving foot and toes well on exam.   Past Medical History:  Diagnosis Date  . Cancer [facility])    prostate.  needs no treatment  . GERD (gastroesophageal reflux disease)    PT TAKES APPLE CIDER VINEGAR   . Hypertension    borderline pressures.  takes no meds    Assessment/Plan: 1 Day Post-Op Procedure(s)  (LRB): UNICOMPARTMENTAL KNEE (Left) Active Problems:   Status post left partial knee replacement  Estimated body mass index is 27.95 kg/m as calculated from the following:   Height as of this encounter: 5\' 11"  (1.803 m).   Weight as of this encounter: 90.9 kg. Advance diet Up with therapy D/C IV fluids when tolerating po intake.  Labs reviewed this AM. Moderate bloody drainage to the left knee.   Up with PT today.  Plan for d/c home this afternoon.  DVT Prophylaxis - Lovenox, Foot Pumps and SCDs Weight-Bearing as tolerated to left leg  J. Cameron Proud, PA-C [facility] 09/02/2019, 7:53 AM

## 2019-09-02 NOTE — TOC Progression Note (Signed)
Transition of Care [facility]) - Progression Note    Patient Details  Name: Adrian Hart MRN: [id number] Date of Birth: December 10, 1950  Transition of Care [facility]) CM/SW [place], RN Phone Number: 09/02/2019, 1:41 PM  Clinical Narrative:    Pt came to me and stated that the patient would benefit from a 3 in 1 and that he does not have one at home. I notified Brad at [employer] of the need   Expected Discharge Plan: [place] Barriers to Discharge: Barriers Resolved  Expected Discharge Plan and Services Expected Discharge Plan: [place]   Discharge Planning Services: CM Consult Post Acute Care Choice: [place] arrangements for the past 2 months: Single Family Home Expected Discharge Date: 09/02/19               DME Arranged: 3-N-1 DME Agency: [employer] Date DME Agency Contacted: 09/02/19 Time DME Agency Contacted: [id number] Representative spoke with at DME Agency: [place]: PT [address]: [place] Date [place]: 09/02/19 Time [place]: 1120 Representative spoke with at [place]: [place]   Social Determinants of Health ([place]) Interventions    Readmission Risk Interventions No flowsheet data found.

## 2019-09-02 NOTE — Progress Notes (Signed)
Physical Therapy Treatment Patient Details Name: Adrian Hart MRN: [id number] DOB: [birth year] Today's Date: 09/02/2019    History of Present Illness Per MD: Left knee pain. Degenerative tear of medial meniscus of left knee, post-traumatic osteoarthritis of Left knee. S/P Left unicondylar knee arthroplasty. PMH includes: R partial knee replacement 09/09/17, prostate cancer, GERD, HTN    PT Comments    Pt presented with Min deficits in strength, transfers, mobility, gait, balance, L knee ROM, and activity tolerance but performed well overall and made good progress towards goals.  Pt was able to perform transfers with SBA and amb 200' with a RW and CGA/SBA with only one small L knee buckle that the pt easily self-corrected.  Pt was CGA/SBA during stair training ascending/descending 4 steps with one rail with good eccentric and concentric control.  Stair training/education provided to both the pt and the pt's daughter who he will be staying with for several days. Pt will benefit from HHPT services upon discharge to safely address above deficits for decreased caregiver assistance and eventual return to PLOF.     Follow Up Recommendations  Home health PT     Equipment Recommendations  3in1 (PT)    Recommendations for Other Services       Precautions / Restrictions Precautions Precautions: Fall Restrictions Weight Bearing Restrictions: Yes LLE Weight Bearing: Weight bearing as tolerated    Mobility  Bed Mobility               General bed mobility comments: NT, pt in recliner  Transfers Overall transfer level: Needs assistance Equipment used: Rolling walker (2 wheeled) Transfers: Sit to/from Stand Sit to Stand: Supervision         General transfer comment: Min verbal cues for sequencing  Ambulation/Gait Ambulation/Gait assistance: Min guard;Supervision Gait Distance (Feet): 200 Feet Assistive device: Rolling walker (2 wheeled) Gait Pattern/deviations: Step-through  pattern;Decreased step length - right;Decreased step length - left;Antalgic Gait velocity: Decreased   General Gait Details: Step-through gait pattern with grossly improved cadence with one instance of mild L knee buckle while walking that the pt was able to easily self-correct   Stairs Stairs: Yes Stairs assistance: Min guard;Supervision Stair Management: One rail Left Number of Stairs: 4 General stair comments: Pt's daughter present during the session and clarified step situation at her residence with pt having 5 steps to enter with wide B rails; pt steady ascending and descending steps with one rail with min verbal cues for sequencing   Wheelchair Mobility    Modified Rankin (Stroke Patients Only)       Balance Overall balance assessment: Needs assistance Sitting-balance support: No upper extremity supported;Feet unsupported Sitting balance-Leahy Scale: Good     Standing balance support: Bilateral upper extremity supported Standing balance-Leahy Scale: Good Standing balance comment: Mod lean into RW                            Cognition Arousal/Alertness: Awake/alert Behavior During Therapy: WFL for tasks assessed/performed Overall Cognitive Status: Within Functional Limits for tasks assessed                                        Exercises Total Joint Exercises Ankle Circles/Pumps: AROM;Strengthening;Both;10 reps;15 reps Quad Sets: AROM;Strengthening;Both;10 reps;15 reps Hip ABduction/ADduction: AROM;Strengthening;Both;5 reps;10 reps Straight Leg Raises: AROM;Strengthening;Left;10 reps Long Arc Quad: AROM;Strengthening;Both;10 reps;5 reps Knee Flexion: AROM;Strengthening;Both;10 reps;5 reps Goniometric  ROM: L knee AROM 11-91 degrees Marching in Standing: AROM;Strengthening;Both;5 reps;10 reps;Standing Other Exercises Other Exercises: Polar care education provided to pt and daughter Other Exercises: Car transfer sequencing education  provided to pt and daughter    General Comments        Pertinent Vitals/Pain Pain Assessment: 0-10 Pain Score: 2  Pain Location: L knee Pain Descriptors / Indicators: Aching;Sore Pain Intervention(s): Premedicated before session;Monitored during session    Home Living                      Prior Function            PT Goals (current goals can now be found in the care plan section) Acute Rehab PT Goals Patient Stated Goal: Walking withour pain PT Goal Formulation: With patient Time For Goal Achievement: 09/15/19 Potential to Achieve Goals: Good Progress towards PT goals: Progressing toward goals    Frequency    BID      PT Plan Current plan remains appropriate    Co-evaluation              AM-PAC PT "6 Clicks" Mobility   Outcome Measure  Help needed turning from your back to your side while in a flat bed without using bedrails?: None Help needed moving from lying on your back to sitting on the side of a flat bed without using bedrails?: None Help needed moving to and from a bed to a chair (including a wheelchair)?: A Little Help needed standing up from a chair using your arms (e.g., wheelchair or bedside chair)?: A Little Help needed to walk in hospital room?: A Little Help needed climbing 3-5 steps with a railing? : A Little 6 Click Score: 20    End of Session Equipment Utilized During Treatment: Gait belt Activity Tolerance: Patient tolerated treatment well Patient left: in chair;with call bell/phone within reach;with chair alarm set;with family/visitor present;with SCD's reapplied;Other (comment)(Polar care donnned to L knee) Nurse Communication: Mobility status PT Visit Diagnosis: Unsteadiness on feet (R26.81);Other abnormalities of gait and mobility (R26.89);Muscle weakness (generalized) (M62.81);Pain Pain - Right/Left: Left Pain - part of body: Knee     Time: [id number] PT Time Calculation (min) (ACUTE ONLY): 29 min  Charges:  $Gait  Training: 8-22 mins $Therapeutic Exercise: 8-22 mins $Therapeutic Activity: 8-22 mins                     D. Scott Aul Mangieri PT, DPT 09/02/19, 3:33 PM

## 2019-09-02 NOTE — TOC Transition Note (Signed)
Transition of Care [facility]) - CM/SW Discharge Note   Patient Details  Name: Louie Flenner MRN: [id number] Date of Birth: 1951-02-08  Transition of Care [facility]) CM/SW Contact:  Su Hilt, RN Phone Number: 09/02/2019, 11:25 AM   Clinical Narrative:    Met with the patient to discuss DC plan and needs He lives alone but will be going to his daughters house for a week the [place] [address] [address] He lives in [place] He would like to use [facility] for [facility] and they agree to go to [place] then change to [place] once he goes home They will contact him on his cell phone to set up the visit, I notified [place] that he will DC today He has a RW at home and stated that he does not need a BSC He can afford his medication He is up to date with his PCP His daughter will provide transportation   Final next level of care: Home w [place] Barriers to Discharge: Barriers Resolved   Patient Goals and CMS Choice Patient states their goals for this hospitalization and ongoing recovery are:: go home CMS Medicare.gov Compare Post Acute Care list provided to:: Patient Choice offered to / list presented to : Patient  Discharge Placement                       Discharge Plan and Services   Discharge Planning Services: CM Consult Post Acute Care Choice: Home Health          DME Arranged: N/A         HH Arranged: PT HH Agency: [facility] Date [place]: 09/02/19 Time [place]: [id number] Representative spoke with at [place]: [place]  Social Determinants of Health ([place]) Interventions     Readmission Risk Interventions No flowsheet data found.

## 2019-09-02 NOTE — Anesthesia Postprocedure Evaluation (Signed)
Anesthesia Post Note  Patient: Adrian Hart  Procedure(s) Performed: UNICOMPARTMENTAL KNEE (Left Knee)  Patient location during evaluation: Nursing Unit Anesthesia Type: Spinal Level of consciousness: awake, awake and alert and oriented Pain management: pain level controlled Vital Signs Assessment: post-procedure vital signs reviewed and stable Respiratory status: spontaneous breathing, respiratory function stable and nonlabored ventilation Cardiovascular status: blood pressure returned to baseline and stable Postop Assessment: no headache and no backache Anesthetic complications: no     Last Vitals:  Vitals:   09/02/19 0419 09/02/19 0753  BP: 117/74 132/82  Pulse: 71 67  Resp: 16   Temp: 36.6 C 36.6 C  SpO2: 97% 98%    Last Pain:  Vitals:   09/02/19 0753  TempSrc: Oral  PainSc:                  [name]

## 2019-09-02 NOTE — Discharge Summary (Signed)
Physician Discharge Summary  Patient ID: Adrian Hart MRN: [id number] DOB/AGE: 1951/06/19 68 y.o.  Admit date: 09/01/2019 Discharge date: 09/02/2019  Admission Diagnoses:  LEFT KNEE PAIN. DEGENERATIVE TEAR OF MEDIAL MENISCUS OF LEFT KNEE. POST-TRAUMATIC OSTEOARTHRITIS OF LEFT KNEE.  Discharge Diagnoses: Patient Active Problem List   Diagnosis Date Noted  . Status post left partial knee replacement 09/01/2019  . Prostate cancer ([place]) 07/28/2019  . Coronary artery disease involving native coronary artery 09/04/2018  . Status post right partial knee replacement 09/05/2017  . Degenerative tear of medial meniscus of left knee 08/12/2017  . Elevated hemoglobin A1c 01/09/2017  . Post-traumatic osteoarthritis of left knee 12/10/2016  . Umbilical hernia without obstruction and without gangrene 09/14/2015  . Health care maintenance 09/07/2015  . Cataract 07/12/2015    Past Medical History:  Diagnosis Date  . Cancer [facility])    prostate.  needs no treatment  . GERD (gastroesophageal reflux disease)    PT TAKES APPLE CIDER VINEGAR   . Hypertension    borderline pressures.  takes no meds   Transfusion: None.   Consultants (if any):   Discharged Condition: Improved  Hospital Course: Adrian Hart is an 68 y.o. male who was admitted 09/01/2019 with a diagnosis of osteoarthritis of the medial compartment of the left knee and went to the operating room on 09/01/2019 and underwent the above named procedures.    Surgeries: Procedure(s): UNICOMPARTMENTAL KNEE on 09/01/2019 Patient tolerated the surgery well. Taken to PACU where she was stabilized and then transferred to the orthopedic floor.  Started on Lovenox 40mg  q 24 hrs. Foot pumps applied bilaterally at 80 mm. Heels elevated on bed with rolled towels. No evidence of DVT. Negative [place]. Physical therapy started on day #1 for gait training and transfer. OT started day #1 for ADL and assisted devices.  Patient's IV and foley were both  removed on POD1.  Implants: All-cemented [employer] [place] system with a medium femoral component, an "F" sized tibial tray, and a 6 mm meniscal bearing insert.  He was given perioperative antibiotics:  Anti-infectives (From admission, onward)   Start     Dose/Rate Route Frequency Ordered Stop   09/01/19 1630  ceFAZolin (ANCEF) IVPB 2g/100 mL premix     2 g 200 mL/hr over 30 Minutes Intravenous Every 6 hours 09/01/19 1437 09/02/19 0545   09/01/19 1000  ceFAZolin (ANCEF) IVPB 2g/100 mL premix     2 g 200 mL/hr over 30 Minutes Intravenous  Once 09/01/19 0957 09/01/19 1048   09/01/19 1000  ceFAZolin (ANCEF) 2-4 GM/100ML-% IVPB    Note to Pharmacy: Register, Karen   : cabinet override      09/01/19 1000 09/01/19 1033    .  He was given sequential compression devices, early ambulation, and Lovenox for DVT prophylaxis.  He benefited maximally from the hospital stay and there were no complications.    Recent vital signs:  Vitals:   09/02/19 0419 09/02/19 0753  BP: 117/74 132/82  Pulse: 71 67  Resp: 16   Temp: 97.8 F (36.6 C) 97.8 F (36.6 C)  SpO2: 97% 98%    Recent laboratory studies:  Lab Results  Component Value Date   HGB 15.1 08/25/2019   HGB 13.3 09/06/2017   HGB 14.9 08/28/2017   Lab Results  Component Value Date   WBC 8.9 08/25/2019   PLT 230 08/25/2019   Lab Results  Component Value Date   INR 1.01 08/28/2017   Lab Results  Component Value Date   NA  139 09/02/2019   K 3.8 09/02/2019   CL 106 09/02/2019   CO2 24 09/02/2019   BUN 18 09/02/2019   CREATININE 0.79 09/02/2019   GLUCOSE 105 (H) 09/02/2019    Discharge Medications:   Allergies as of 09/02/2019      Reactions   Fish-derived Products Nausea And Vomiting   Reaction to mussells only   Other Nausea And Vomiting      Medication List    TAKE these medications   enoxaparin 40 MG/0.4ML injection Commonly known as: LOVENOX Inject 0.4 mLs (40 mg total) into the skin daily.   Flaxseed Oil 1200  MG Caps Take 1,200 mg by mouth 2 (two) times daily.   Ginseng 100 MG Caps Take 100 mg by mouth daily as needed (antiinflammatory).   Magnesium 250 MG Tabs Take 250 mg by mouth daily.   meloxicam 15 MG tablet Commonly known as: MOBIC Take 15 mg by mouth daily as needed for pain.   Multi-Vitamins Tabs Take 1 tablet by mouth daily.   oxyCODONE 5 MG immediate release tablet Commonly known as: Oxy IR/ROXICODONE Take 1-2 tablets (5-10 mg total) by mouth every 4 (four) hours as needed for moderate pain.   PSYLLIUM PO Take 3 capsules by mouth 2 (two) times daily.   SAW PALMETTO PO Take 3 capsules by mouth daily.   Super B-Complex Caps Take 1 capsule by mouth daily. Takes half a caplet a day   traMADol 50 MG tablet Commonly known as: ULTRAM Take 1 tablet (50 mg total) by mouth every 6 (six) hours.   traZODone 50 MG tablet Commonly known as: DESYREL Take 25 mg by mouth at bedtime as needed for sleep.            Durable Medical Equipment  (From admission, onward)         Start     Ordered   09/01/19 1438  DME Bedside commode  Once    Question:  Patient needs a bedside commode to treat with the following condition  Answer:  Status post left partial knee replacement   09/01/19 1437   09/01/19 1438  DME 3 n 1  Once     09/01/19 1437   09/01/19 1438  DME Walker rolling  Once    Question:  Patient needs a walker to treat with the following condition  Answer:  Status post left partial knee replacement   09/01/19 1437         Diagnostic Studies: Dg Knee Left Port  Result Date: 09/01/2019 CLINICAL DATA:  Postop from unicompartmental knee arthroplasty. EXAM: PORTABLE LEFT KNEE - 1-2 VIEW COMPARISON:  None. FINDINGS: Medial compartment knee arthroplasty is seen with components in expected position. No evidence of fracture or dislocation. Large joint effusion and intra-articular air noted. Overlying skin staples are also seen. IMPRESSION: Expected postop appearance of medial  compartment knee arthroplasty. No evidence of fracture. Electronically Signed   By: Marlaine Hind M.D.   On: 09/01/2019 13:51   Disposition: Plan for discharge home this afternoon pending progress with PT.  Follow-up Information    Lattie Corns, PA-C Follow up in 14 day(s).   Specialty: Physician Assistant Why: Electa Sniff information: [place] [id number] [phone]          Signed: Judson Roch PA-C 09/02/2019, 7:58 AM

## 2019-09-02 NOTE — Progress Notes (Signed)
CPM initiated at 200 patient was only able to tolerate it for about 5 hours before he wanted it removed. Stated it was keeping him awake

## 2019-09-02 NOTE — Progress Notes (Signed)
Discharge summary reviewed with verbal understanding. TEDS given and applied prior to discharge. BM not necessary per PA.

## 2019-09-02 NOTE — Evaluation (Signed)
Physical Therapy Evaluation Patient Details Name: Adrian Hart MRN: [id number] DOB: 1951-04-05 Today's Date: 09/02/2019   History of Present Illness  Per MD: Left knee pain. Degenerative tear of medial meniscus of left knee, post-traumatic osteoarthritis of Left knee. S/P Left unicondylar knee arthroplasty. PMH includes: R partial knee replacement 09/09/17, prostate cancer, GERD, HTN  Clinical Impression  Pt presented with deficits in strength, transfers, gait, balance, and L knee ROM. Pt was in a good mood, reporting pain up to a 5/10 in session, and felt like it was going to get worse and asked nursing for medication at the end of session. Pt completed mobility with mod Ind, and transfers and ambulation with CGA for safety, especially when L knee would momentarily lock out during reduced stride step-through gait pattern, with mod lean on RW. Pt completed stair training and required only min cuing and CGA, showing good eccentric and concentric control for backward ascent and forward descent with RW. Pt will benefit from HHPT services upon discharge to safely address above deficits for decreased caregiver assistance and eventual return to PLOF.      Follow Up Recommendations Home health PT    Equipment Recommendations  Other (comment);3in1 (PT)(Pt has a regular walker and a RW from last knee surgery)    Recommendations for Other Services       Precautions / Restrictions Precautions Precautions: Fall Restrictions Weight Bearing Restrictions: Yes LLE Weight Bearing: Weight bearing as tolerated      Mobility  Bed Mobility Overal bed mobility: Modified Independent                Transfers Overall transfer level: Needs assistance Equipment used: Rolling walker (2 wheeled) Transfers: Sit to/from Stand Sit to Stand: Min guard            Ambulation/Gait Ambulation/Gait assistance: Min guard Gait Distance (Feet): 200 Feet Assistive device: Rolling walker (2 wheeled) Gait  Pattern/deviations: Step-through pattern;Decreased step length - right;Decreased step length - left;Antalgic;Decreased stride length Gait velocity: Decreased   General Gait Details: Pt able to use a step-through pattern, but experienced his L knee hyperextending/locking out x5  Stairs Stairs: Yes Stairs assistance: Min guard Stair Management: No rails Number of Stairs: 6 General stair comments: Pt used "up with the good, down with the bad" pattern for backward ascent and forward descent with good eccentric and concentric control, and experienced his L knee hyperextending/locking out x2  Wheelchair Mobility    Modified Rankin (Stroke Patients Only)       Balance Overall balance assessment: Needs assistance Sitting-balance support: No upper extremity supported;Feet unsupported Sitting balance-Leahy Scale: Good     Standing balance support: Bilateral upper extremity supported Standing balance-Leahy Scale: Fair Standing balance comment: Mod lean into RW                             Pertinent Vitals/Pain Pain Assessment: 0-10 Pain Score: 5  Pain Location: Pain around a 3 at rest, and up to a 5 with functional activities and ambulation Pain Descriptors / Indicators: Tender;Pressure;Aching Pain Intervention(s): Limited activity within patient's tolerance;Monitored during session;Patient requesting pain meds-RN notified    Home Living Family/patient expects to be discharged to:: Private residence   Available Help at Discharge: Family;Available 24 hours/day;Available PRN/intermittently Type of Home: House Home Access: Stairs to enter Entrance Stairs-Rails: None Entrance Stairs-Number of Steps: 2 at home with no rail, 5 at daughter's home with bilat rails but too wide to use both  at the same time Home Layout: One level Home Equipment: Gilford Rile - 2 wheels;Walker - standard      Prior Function Level of Independence: Independent         Comments: Pt could perform  all ADLs, IADLs, walk for miles, however his L knee would hyperextend on a daily basis during normal ambulation     Hand Dominance        Extremity/Trunk Assessment   Upper Extremity Assessment Upper Extremity Assessment: Overall WFL for tasks assessed    Lower Extremity Assessment Lower Extremity Assessment: LLE deficits/detail LLE Deficits / Details: Post-op knee pain, bleeding through dressing x2 LLE Sensation: WNL(Pt said that even though he could see he was activating his muscles, that it felt like he wasn't doing it right.)    Cervical / Trunk Assessment Cervical / Trunk Assessment: Normal  Communication   Communication: No difficulties  Cognition Arousal/Alertness: Awake/alert Behavior During Therapy: WFL for tasks assessed/performed Overall Cognitive Status: Within Functional Limits for tasks assessed                                        General Comments      Exercises Total Joint Exercises Ankle Circles/Pumps: AROM;Strengthening;Both;10 reps;15 reps Quad Sets: AROM;Strengthening;Both;10 reps;15 reps Hip ABduction/ADduction: AROM;Strengthening;Both;5 reps;10 reps Straight Leg Raises: AROM;Strengthening;Left;10 reps Long Arc Quad: AROM;Strengthening;Both;5 reps;10 reps Knee Flexion: AROM;Strengthening;Both;10 reps Goniometric ROM: L knee AROM 11-91 degrees Marching in Standing: AROM;Strengthening;Both;5 reps;10 reps Other Exercises Other Exercises: Pt educated on HEP, stair training, and LLE external rotation to minimized CKC twisting on L knee   Assessment/Plan    PT Assessment Patient needs continued PT services  PT Problem List Decreased strength;Decreased range of motion;Decreased balance;Decreased coordination;Pain       PT Treatment Interventions DME instruction;Gait training;Stair training;Functional mobility training;Therapeutic activities;Therapeutic exercise;Balance training;Patient/family education    PT Goals (Current goals  can be found in the Care Plan section)  Acute Rehab PT Goals Patient Stated Goal: Walking withour pain PT Goal Formulation: With patient Time For Goal Achievement: 09/15/19 Potential to Achieve Goals: Good    Frequency BID   Barriers to discharge        Co-evaluation               AM-PAC PT "6 Clicks" Mobility  Outcome Measure Help needed turning from your back to your side while in a flat bed without using bedrails?: None Help needed moving from lying on your back to sitting on the side of a flat bed without using bedrails?: None Help needed moving to and from a bed to a chair (including a wheelchair)?: A Little Help needed standing up from a chair using your arms (e.g., wheelchair or bedside chair)?: None Help needed to walk in hospital room?: A Little Help needed climbing 3-5 steps with a railing? : A Little 6 Click Score: 21    End of Session Equipment Utilized During Treatment: Gait belt Activity Tolerance: Patient tolerated treatment well;Patient limited by pain Patient left: in chair;with call bell/phone within reach;with chair alarm set;with nursing/sitter in room(SCDs and Polar Care not donned due to nursing cleaning up min incisional bleeding and changing dressing.) Nurse Communication: Mobility status;Patient requests pain meds PT Visit Diagnosis: Unsteadiness on feet (R26.81);Other abnormalities of gait and mobility (R26.89);Muscle weakness (generalized) (M62.81);Pain Pain - Right/Left: Left Pain - part of body: Knee(Post-op knee)    Time: 1000-1100 PT Time Calculation (min) (  ACUTE ONLY): 60 min   Charges:            Juanda Crumble "Gus" Jeannette Corpus, SPT  09/02/19, 1:48 PM

## 2019-09-02 NOTE — Discharge Instructions (Signed)

## 2019-09-04 ENCOUNTER — Other Ambulatory Visit: Payer: Self-pay

## 2019-09-04 ENCOUNTER — Ambulatory Visit: Payer: PPO

## 2019-09-04 ENCOUNTER — Emergency Department (HOSPITAL_COMMUNITY): Payer: PPO

## 2019-09-04 ENCOUNTER — Other Ambulatory Visit: Payer: Self-pay | Admitting: Student

## 2019-09-04 ENCOUNTER — Emergency Department (HOSPITAL_COMMUNITY)
Admission: EM | Admit: 2019-09-04 | Discharge: 2019-09-05 | Disposition: A | Discharge status: Home / Self Care | Payer: PPO | Attending: Emergency Medicine | Admitting: Emergency Medicine

## 2019-09-04 DIAGNOSIS — I10 Essential (primary) hypertension: Secondary | ICD-10-CM | POA: Insufficient documentation

## 2019-09-04 DIAGNOSIS — R000 Tachycardia, unspecified: Secondary | ICD-10-CM | POA: Diagnosis not present

## 2019-09-04 DIAGNOSIS — Z9181 History of falling: Secondary | ICD-10-CM | POA: Diagnosis not present

## 2019-09-04 DIAGNOSIS — Z96653 Presence of artificial knee joint, bilateral: Secondary | ICD-10-CM | POA: Insufficient documentation

## 2019-09-04 DIAGNOSIS — Z87891 Personal history of nicotine dependence: Secondary | ICD-10-CM | POA: Diagnosis not present

## 2019-09-04 DIAGNOSIS — C61 Malignant neoplasm of prostate: Secondary | ICD-10-CM | POA: Diagnosis not present

## 2019-09-04 DIAGNOSIS — Z7901 Long term (current) use of anticoagulants: Secondary | ICD-10-CM | POA: Diagnosis not present

## 2019-09-04 DIAGNOSIS — M79605 Pain in left leg: Secondary | ICD-10-CM

## 2019-09-04 DIAGNOSIS — R079 Chest pain, unspecified: Secondary | ICD-10-CM | POA: Diagnosis not present

## 2019-09-04 DIAGNOSIS — G8918 Other acute postprocedural pain: Secondary | ICD-10-CM

## 2019-09-04 DIAGNOSIS — Z8546 Personal history of malignant neoplasm of prostate: Secondary | ICD-10-CM | POA: Diagnosis not present

## 2019-09-04 DIAGNOSIS — M7989 Other specified soft tissue disorders: Secondary | ICD-10-CM | POA: Diagnosis not present

## 2019-09-04 DIAGNOSIS — R0789 Other chest pain: Secondary | ICD-10-CM | POA: Insufficient documentation

## 2019-09-04 DIAGNOSIS — I2510 Atherosclerotic heart disease of native coronary artery without angina pectoris: Secondary | ICD-10-CM | POA: Diagnosis not present

## 2019-09-04 DIAGNOSIS — K219 Gastro-esophageal reflux disease without esophagitis: Secondary | ICD-10-CM | POA: Diagnosis not present

## 2019-09-04 DIAGNOSIS — Z96652 Presence of left artificial knee joint: Secondary | ICD-10-CM

## 2019-09-04 DIAGNOSIS — R2242 Localized swelling, mass and lump, left lower limb: Secondary | ICD-10-CM | POA: Diagnosis present

## 2019-09-04 DIAGNOSIS — M25462 Effusion, left knee: Secondary | ICD-10-CM | POA: Diagnosis not present

## 2019-09-04 DIAGNOSIS — Z79891 Long term (current) use of opiate analgesic: Secondary | ICD-10-CM | POA: Diagnosis not present

## 2019-09-04 DIAGNOSIS — Z79899 Other long term (current) drug therapy: Secondary | ICD-10-CM | POA: Diagnosis not present

## 2019-09-04 DIAGNOSIS — Z471 Aftercare following joint replacement surgery: Secondary | ICD-10-CM | POA: Diagnosis not present

## 2019-09-04 DIAGNOSIS — M1990 Unspecified osteoarthritis, unspecified site: Secondary | ICD-10-CM | POA: Diagnosis not present

## 2019-09-04 LAB — COMPREHENSIVE METABOLIC PANEL
ALT: 23 U/L (ref 0–44)
AST: 22 U/L (ref 15–41)
Albumin: 3.8 g/dL (ref 3.5–5.0)
Alkaline Phosphatase: 59 U/L (ref 38–126)
Anion gap: 11 (ref 5–15)
BUN: 11 mg/dL (ref 8–23)
CO2: 27 mmol/L (ref 22–32)
Calcium: 9.1 mg/dL (ref 8.9–10.3)
Chloride: 100 mmol/L (ref 98–111)
Creatinine, Ser: 0.86 mg/dL (ref 0.61–1.24)
GFR calc Af Amer: >60 mL/min (ref >60)
GFR calc non Af Amer: >60 mL/min (ref >60)
Glucose, Bld: 106 mg/dL — ABNORMAL HIGH (ref 70–99)
Potassium: 3.7 mmol/L (ref 3.5–5.1)
Sodium: 138 mmol/L (ref 135–145)
Total Bilirubin: 0.9 mg/dL (ref 0.3–1.2)
Total Protein: 6.9 g/dL (ref 6.5–8.1)

## 2019-09-04 LAB — CBC WITH DIFFERENTIAL/PLATELET
Abs Immature Granulocytes: 0.05 10*3/uL (ref 0.00–0.07)
Basophils Absolute: 0 10*3/uL (ref 0.0–0.1)
Basophils Relative: 0 %
Eosinophils Absolute: 0.2 10*3/uL (ref 0.0–0.5)
Eosinophils Relative: 1 %
HCT: 42 % (ref 39.0–52.0)
Hemoglobin: 14.3 g/dL (ref 13.0–17.0)
Immature Granulocytes: 0 %
Lymphocytes Relative: 13 %
Lymphs Abs: 1.5 10*3/uL (ref 0.7–4.0)
MCH: 29.9 pg (ref 26.0–34.0)
MCHC: 34 g/dL (ref 30.0–36.0)
MCV: 87.9 fL (ref 80.0–100.0)
Monocytes Absolute: 1.3 10*3/uL — ABNORMAL HIGH (ref 0.1–1.0)
Monocytes Relative: 12 %
Neutro Abs: 8.5 10*3/uL — ABNORMAL HIGH (ref 1.7–7.7)
Neutrophils Relative %: 74 %
Platelets: 215 10*3/uL (ref 150–400)
RBC: 4.78 MIL/uL (ref 4.22–5.81)
RDW: 13.2 % (ref 11.5–15.5)
WBC: 11.6 10*3/uL — ABNORMAL HIGH (ref 4.0–10.5)
nRBC: 0 % (ref 0.0–0.2)

## 2019-09-04 LAB — TROPONIN I (HIGH SENSITIVITY)
Troponin I (High Sensitivity): 5 ng/L (ref <18)
Troponin I (High Sensitivity): 6 ng/L (ref <18)

## 2019-09-04 MED ORDER — HYDROMORPHONE HCL 1 MG/ML IJ SOLN
1.0000 mg | Freq: Once | INTRAMUSCULAR | Status: AC
  Administered 2019-09-04: 1 mg via INTRAMUSCULAR
  Filled 2019-09-04: qty 1

## 2019-09-04 NOTE — ED Triage Notes (Signed)
Pt to ER for post op pain related to partial knee replacement on Tuesday. Reports PT sent him here for evaluation of severe pain. Reports has had right knee replacement in the past and this is nothing compared to that pain. Reports significant swelling to left leg with redness. Reports feels like he is having hot and cold chills. Also reports "twinge" in his chest.

## 2019-09-04 NOTE — ED Provider Notes (Signed)
[facility] EMERGENCY DEPARTMENT Provider Note   CSN: [id number] Arrival date & time: 09/04/19  1250     History   Chief Complaint Chief Complaint  Patient presents with  . Post-op Problem    HPI Adrian Hart is a 68 y.o. male.     The history is provided by the patient.  Knee Pain Location:  Knee Knee location:  L knee Pain details:    Quality:  Aching   Radiates to: left thigh.   Severity:  Severe   Onset quality:  Sudden   Timing:  Constant   Progression:  Worsening Chronicity:  New Relieved by:  Nothing Exacerbated by: movement. Associated symptoms: no fever   Patient underwent left partial knee replacement on September 29 Since that time he had increasing pain and swelling to the left knee and pain and cramping in his left thigh.  No fevers.  He reports nausea.  He was seen by physical therapy and recommended a DVT study.  However he reported chest tightness therefore they recommended ER visit.  He reports he had a 20 minute  episode of chest tightness earlier in the day.  No shortness of breath, but his son reports he was diaphoretic.  No chest pain since that time.  No history of CAD/PE/CVA   Patient reports he is taking daily Lovenox 40 mg subQ No previous history of VTE He reports previous history of right knee replacement but never had this much severe pain Past Medical History:  Diagnosis Date  . Cancer [facility])    prostate.  needs no treatment  . GERD (gastroesophageal reflux disease)    PT TAKES APPLE CIDER VINEGAR   . Hypertension    borderline pressures.  takes no meds    Patient Active Problem List   Diagnosis Date Noted  . Status post left partial knee replacement 09/01/2019  . Prostate cancer ([place]) 07/28/2019  . Coronary artery disease involving native coronary artery 09/04/2018  . Status post right partial knee replacement 09/05/2017  . Degenerative tear of medial meniscus of left knee 08/12/2017  . Elevated hemoglobin A1c  01/09/2017  . Post-traumatic osteoarthritis of left knee 12/10/2016  . Umbilical hernia without obstruction and without gangrene 09/14/2015  . Health care maintenance 09/07/2015  . Cataract 07/12/2015    Past Surgical History:  Procedure Laterality Date  . COLONOSCOPY  2003   Dr Alveta Heimlich  . COLONOSCOPY WITH PROPOFOL N/A 05/02/2018   Procedure: COLONOSCOPY WITH PROPOFOL;  Surgeon: Manya Silvas, MD;  Location: [facility] ENDOSCOPY;  Service: Endoscopy;  Laterality: N/A;  . EYE SURGERY Right    cataract  . FLEXIBLE SIGMOIDOSCOPY  1996   Dr Bary Castilla  . HEMORRHOID SURGERY  04-20-04   Dr Bary Castilla  . HERNIA REPAIR  19/14/7829   Umbilical, primary repair  . KNEE SURGERY Left 1995   arthroscopy  . PARTIAL KNEE ARTHROPLASTY Right 09/05/2017   Procedure: UNICOMPARTMENTAL KNEE;  Surgeon: Corky Mull, MD;  Location: [facility] ORS;  Service: Orthopedics;  Laterality: Right;  . PARTIAL KNEE ARTHROPLASTY Left 09/01/2019   Procedure: UNICOMPARTMENTAL KNEE;  Surgeon: Corky Mull, MD;  Location: [facility] ORS;  Service: Orthopedics;  Laterality: Left;  . TONSILLECTOMY    . UMBILICAL HERNIA REPAIR N/A 09/27/2015   Procedure: HERNIA REPAIR UMBILICAL ADULT;  Surgeon: Robert Bellow, MD;  Location: [facility] ORS;  Service: General;  Laterality: N/A;        Home Medications    Prior to Admission medications   Medication Sig  Start Date End Date Taking? Authorizing Provider  B Complex-Biotin-FA (SUPER B-COMPLEX) CAPS Take 1 capsule by mouth daily. Takes half a caplet a day    [provider]  enoxaparin (LOVENOX) 40 MG/0.4ML injection Inject 0.4 mLs (40 mg total) into the skin daily. 09/02/19   Lattie Corns, PA-C  Flaxseed, Linseed, (FLAXSEED OIL) 1200 MG CAPS Take 1,200 mg by mouth 2 (two) times daily.    [provider]  Ginseng 100 MG CAPS Take 100 mg by mouth daily as needed (antiinflammatory).    [provider]  Magnesium 250 MG TABS Take 250 mg by mouth daily.    [provider]  meloxicam (MOBIC) 15 MG tablet Take 15 mg by mouth daily as needed for pain.     [provider]  Multiple Vitamin (MULTI-VITAMINS) TABS Take 1 tablet by mouth daily.     [provider]  oxyCODONE (OXY IR/ROXICODONE) 5 MG immediate release tablet Take 1-2 tablets (5-10 mg total) by mouth every 4 (four) hours as needed for moderate pain. 09/02/19   Lattie Corns, PA-C  PSYLLIUM PO Take 3 capsules by mouth 2 (two) times daily.     [provider]  Saw Palmetto, Serenoa repens, (SAW PALMETTO PO) Take 3 capsules by mouth daily.     [provider]  traMADol (ULTRAM) 50 MG tablet Take 1 tablet (50 mg total) by mouth every 6 (six) hours. 09/02/19   Lattie Corns, PA-C  traZODone (DESYREL) 50 MG tablet Take 25 mg by mouth at bedtime as needed for sleep.     [provider]    Family History Family History  Problem Relation Age of Onset  . Diabetes Mother   . Bladder Cancer Neg Hx   . Kidney cancer Neg Hx   . Prostate cancer Neg Hx     Social History Social History   Tobacco Use  . Smoking status: Former Smoker    Packs/day: 0.75    Years: 40.00    Pack years: 30.00    Types: Cigarettes    Quit date: 2009    Years since quitting: 11.7  . Smokeless tobacco: Never Used  Substance Use Topics  . Alcohol use: No    Alcohol/week: 0.0 standard drinks  . Drug use: No    Comment: occasional pipe smoker     Allergies   Fish-derived products and Other   Review of Systems Review of Systems  Constitutional: Negative for fever.  Musculoskeletal: Positive for arthralgias and joint swelling.  Skin: Positive for wound.       Bruising   All other systems reviewed and are negative.    Physical Exam Updated Vital Signs BP (!) 155/80 (BP Location: Right Arm)   Pulse (!) 113   Temp 98.1 F (36.7 C) (Oral)   Resp 18   SpO2 98%   Physical Exam  CONSTITUTIONAL: Well developed/well nourished HEAD:  Normocephalic/atraumatic EYES: EOMI NECK: supple no meningeal signs CV: S1/S2 noted, no murmurs/rubs/gallops noted LUNGS: Lungs are clear to auscultation bilaterally, no apparent distress ABDOMEN: soft, nontender NEURO: Pt is awake/alert/appropriate, moves all extremitiesx4.  No facial droop.   EXTREMITIES: pulses normal/equal, full ROM, pulses noted in left foot.  He reports diffuse pain to left knee.  There is no crepitus.  Erythema is noted to the left thigh. See photo below SKIN: warm, color normal PSYCH: Mildly anxious      Patient gave verbal permission to utilize photo for medical documentation only The  image was not stored on any personal device  ED Treatments / Results  Labs (all labs ordered are listed, but only abnormal results are displayed) Labs Reviewed  COMPREHENSIVE METABOLIC PANEL - Abnormal; Notable for the following components:      Result Value   Glucose, Bld 106 (*)    All other components within normal limits  CBC WITH DIFFERENTIAL/PLATELET - Abnormal; Notable for the following components:   WBC 11.6 (*)    Neutro Abs 8.5 (*)    Monocytes Absolute 1.3 (*)    All other components within normal limits  SEDIMENTATION RATE  C-REACTIVE PROTEIN  TROPONIN I (HIGH SENSITIVITY)  TROPONIN I (HIGH SENSITIVITY)    EKG EKG Interpretation  Date/Time:  Friday September 04 2019 13:34:54 EDT Ventricular Rate:  104 PR Interval:  196 QRS Duration: 90 QT Interval:  324 QTC Calculation: 426 R Axis:   46 Text Interpretation:  Sinus tachycardia Otherwise normal ECG Interpretation limited secondary to artifact Confirmed by Ripley Fraise [phone]) on 09/04/2019 11:22:15 PM   Radiology Dg Chest 2 View  Result Date: 09/04/2019 CLINICAL DATA:  Chest pain and palpitation. Ex-smoker. EXAM: CHEST - 2 VIEW COMPARISON:  08/28/2017 FINDINGS: Normal sized heart. Tortuous aorta. The lungs remain clear and hyperexpanded. Left nipple shadow. Mild thoracic spine degenerative  changes. IMPRESSION: No acute abnormality. Stable changes of COPD. Electronically Signed   By: Claudie Revering M.D.   On: 09/04/2019 14:36   Dg Knee Left Port  Result Date: 09/05/2019 CLINICAL DATA:  Knee pain EXAM: PORTABLE LEFT KNEE - 1-2 VIEW COMPARISON:  August 31, 2019 FINDINGS: The patient is status post medial unicompartmental arthroplasty. No periprosthetic lucency or fracture is identified. There is diffuse mild soft tissue swelling. There is heterogeneous soft tissue density seen within the suprapatellar region which could represent a joint effusion. There still remains a small amount of emphysema within the joint space. Overlying prepatellar soft tissue edema and surgical staples are seen IMPRESSION: 1. Status post unicompartmental arthroplasty without hardware complication. 2. Mild prepatellar and medial subcutaneous edema. 3. Moderate knee joint effusion. Electronically Signed   By: Prudencio Pair M.D.   On: 09/05/2019 00:01    Procedures Procedures  Medications Ordered in ED Medications  cephALEXin (KEFLEX) capsule 500 mg (has no administration in time range)  enoxaparin (LOVENOX) injection 90 mg (has no administration in time range)  HYDROmorphone (DILAUDID) injection 1 mg (1 mg Intramuscular Given 09/04/19 2351)     Initial Impression / Assessment and Plan / ED Course  I have reviewed the triage vital signs and the nursing notes.  Pertinent labs & imaging results that were available during my care of the patient were reviewed by me and considered in my medical decision making (see chart for details).        11:58 PM Plan to call his orthopedist Dr. Roland Rack  with [facility] Will get xray of left knee 12:39 AM Discussed the case with Dr. Roland Rack his orthopedist We discussed x-ray, labs and exam findings.  He reports that is very unlikely that he has a postoperative joint infection this early out.  He does agree that patient needs to have ultrasound for DVT.  He also  requested an ESR/CRP so he can monitor that as an outpatient.  Patient will call his orthopedist in 2 days for close follow-up  I again discussed his chest pain with the patient.  He flatly denies any pleuritic pain.  No cough or hemoptysis.  He has not had any chest  pain at this time.  He reports when he had the pain earlier on Friday morning it was chest tightness for 20 minutes.  Heart rate is currently 90s, no hypoxia.  He has no active chest pain.  No pleuritic pain He does not have signs of an acute PE at this time.  Plan will be to administer full dose of Lovenox at this time to cover for possible DVT.  He will return later in the morning for next day DVT study.  On reexam, patient does have some warmth and erythema proximal to the knee.  He could be developing early cellulitis.  Will place patient on Keflex for now. Per pharmacy, he should wait until October 4 to start his prophylactic dose of Lovenox if he does not have a DVT  Patient denies any numbness or weakness in his feet.  Distal pulses intact.  Low suspicion for compartment syndrome  Final Clinical Impressions(s) / ED Diagnoses   Final diagnoses:  Post-op pain  Left leg swelling    ED Discharge Orders         Ordered    cephALEXin (KEFLEX) 500 MG capsule  3 times daily     09/05/19 0027    LE VENOUS     09/05/19 0027           Ripley Fraise, MD 09/05/19 [phone]

## 2019-09-04 NOTE — ED Notes (Signed)
Daughter called and states they were told pt needed to come to ED for vascular US.  Pt has been waiting in ED lobby for over 6 1/2 hours at this time.  No order is in for [place].  Explained process to daughter and to pt and explained that vascular US is no longer here past 7pm.  Apologized for their wait and explained that order had not been placed for [place] prior to 7pm.  Daughter questioning if she just needs to take pt home.  Encouraged them to stay for pt to be seen by MD and told them if physician feels that pt needs vascular US it will be scheduled in the morning.  Delay for treatment room explained to daughter and to pt.

## 2019-09-04 NOTE — ED Notes (Signed)
Reports last dose of pain medication at 11 am

## 2019-09-05 ENCOUNTER — Ambulatory Visit (HOSPITAL_BASED_OUTPATIENT_CLINIC_OR_DEPARTMENT_OTHER)
Admission: RE | Admit: 2019-09-05 | Discharge: 2019-09-05 | Disposition: A | Discharge status: Home / Self Care | Payer: PPO | Source: Ambulatory Visit | Attending: Emergency Medicine | Admitting: Emergency Medicine

## 2019-09-05 DIAGNOSIS — M79609 Pain in unspecified limb: Secondary | ICD-10-CM

## 2019-09-05 DIAGNOSIS — M7989 Other specified soft tissue disorders: Secondary | ICD-10-CM

## 2019-09-05 LAB — SEDIMENTATION RATE: Sed Rate: 47 mm/hr — ABNORMAL HIGH (ref 0–16)

## 2019-09-05 LAB — C-REACTIVE PROTEIN: CRP: 12.8 mg/dL — ABNORMAL HIGH (ref <1.0)

## 2019-09-05 MED ORDER — ENOXAPARIN SODIUM 100 MG/ML ~~LOC~~ SOLN
1.0000 mg/kg | Freq: Once | SUBCUTANEOUS | Status: AC
  Administered 2019-09-05: 90 mg via SUBCUTANEOUS
  Filled 2019-09-05: qty 0.9

## 2019-09-05 MED ORDER — CEPHALEXIN 250 MG PO CAPS
500.0000 mg | ORAL_CAPSULE | Freq: Once | ORAL | Status: AC
  Administered 2019-09-05: 500 mg via ORAL
  Filled 2019-09-05: qty 2

## 2019-09-05 MED ORDER — CEPHALEXIN 500 MG PO CAPS: 500.0000 mg | ORAL_CAPSULE | Freq: Three times a day (TID) | ORAL | 0 refills | Status: DC

## 2019-09-05 NOTE — ED Notes (Signed)
Patient verbalizes understanding of discharge instructions. Opportunity for questioning and answers were provided. Armband removed by staff, pt discharged from ED via wheelchair.  

## 2019-09-05 NOTE — ED Notes (Signed)
Called dr Milagros Evener to Dr Launa Flight

## 2019-09-05 NOTE — Discharge Instructions (Addendum)
Be sure to pick up the prescription for Keflex at the pharmacy.  Call your orthopedic doctor on Monday  If you are negative for a blood clot tomorrow morning, take your next dose of Lovenox 40 mg on Sunday morning October 4

## 2019-09-05 NOTE — Progress Notes (Signed)
VASCULAR LAB PRELIMINARY  PRELIMINARY  PRELIMINARY  PRELIMINARY  Left lower extremity venous duplex completed.    Preliminary report:  See CV proc for preliminary results.  Solyana Nonaka, RVT 09/05/2019, 12:34 PM

## 2019-09-07 ENCOUNTER — Encounter: Payer: Self-pay | Admitting: *Deleted

## 2019-09-07 ENCOUNTER — Telehealth: Payer: Self-pay | Admitting: *Deleted

## 2019-09-07 NOTE — Telephone Encounter (Signed)
Left message for patient to notify them that it is time to schedule annual low dose lung cancer screening CT scan. Instructed patient to call back to verify information prior to the scan being scheduled.  

## 2019-09-10 DIAGNOSIS — Z471 Aftercare following joint replacement surgery: Secondary | ICD-10-CM | POA: Diagnosis not present

## 2019-09-10 DIAGNOSIS — I2510 Atherosclerotic heart disease of native coronary artery without angina pectoris: Secondary | ICD-10-CM | POA: Diagnosis not present

## 2019-09-10 DIAGNOSIS — Z7901 Long term (current) use of anticoagulants: Secondary | ICD-10-CM | POA: Diagnosis not present

## 2019-09-10 DIAGNOSIS — C61 Malignant neoplasm of prostate: Secondary | ICD-10-CM | POA: Diagnosis not present

## 2019-09-10 DIAGNOSIS — K219 Gastro-esophageal reflux disease without esophagitis: Secondary | ICD-10-CM | POA: Diagnosis not present

## 2019-09-10 DIAGNOSIS — I10 Essential (primary) hypertension: Secondary | ICD-10-CM | POA: Diagnosis not present

## 2019-09-10 DIAGNOSIS — M1990 Unspecified osteoarthritis, unspecified site: Secondary | ICD-10-CM | POA: Diagnosis not present

## 2019-09-10 DIAGNOSIS — Z96653 Presence of artificial knee joint, bilateral: Secondary | ICD-10-CM | POA: Diagnosis not present

## 2019-09-15 ENCOUNTER — Telehealth: Payer: Self-pay | Admitting: *Deleted

## 2019-09-15 NOTE — Telephone Encounter (Signed)
Left message for patient to notify them that it is time to schedule annual low dose lung cancer screening CT scan. Instructed patient to call back to verify information prior to the scan being scheduled.  

## 2019-09-16 ENCOUNTER — Telehealth: Payer: Self-pay | Admitting: *Deleted

## 2019-09-16 DIAGNOSIS — Z122 Encounter for screening for malignant neoplasm of respiratory organs: Secondary | ICD-10-CM

## 2019-09-16 DIAGNOSIS — Z96652 Presence of left artificial knee joint: Secondary | ICD-10-CM | POA: Diagnosis not present

## 2019-09-16 DIAGNOSIS — M25562 Pain in left knee: Secondary | ICD-10-CM | POA: Diagnosis not present

## 2019-09-16 DIAGNOSIS — Z87891 Personal history of nicotine dependence: Secondary | ICD-10-CM

## 2019-09-16 NOTE — Telephone Encounter (Signed)
Patient has been notified that annual lung cancer screening low dose CT scan is due currently or will be in near future. Confirmed that patient is within the age range of 55-77, and asymptomatic, (no signs or symptoms of lung cancer). Patient denies illness that would prevent curative treatment for lung cancer if found. Verified smoking history, (former, quit 2009, 30 pack year). The shared decision making visit was done 09/02/18. Patient is agreeable for CT scan being scheduled.

## 2019-09-18 DIAGNOSIS — M25562 Pain in left knee: Secondary | ICD-10-CM | POA: Diagnosis not present

## 2019-09-18 DIAGNOSIS — Z96652 Presence of left artificial knee joint: Secondary | ICD-10-CM | POA: Diagnosis not present

## 2019-09-21 DIAGNOSIS — M25562 Pain in left knee: Secondary | ICD-10-CM | POA: Diagnosis not present

## 2019-09-21 DIAGNOSIS — Z96652 Presence of left artificial knee joint: Secondary | ICD-10-CM | POA: Diagnosis not present

## 2019-09-22 ENCOUNTER — Other Ambulatory Visit: Payer: Self-pay

## 2019-09-22 ENCOUNTER — Ambulatory Visit
Admission: RE | Admit: 2019-09-22 | Discharge: 2019-09-22 | Disposition: A | Discharge status: Home / Self Care | Payer: PPO | Source: Ambulatory Visit | Attending: Oncology | Admitting: Oncology

## 2019-09-22 DIAGNOSIS — Z87891 Personal history of nicotine dependence: Secondary | ICD-10-CM | POA: Insufficient documentation

## 2019-09-22 DIAGNOSIS — Z122 Encounter for screening for malignant neoplasm of respiratory organs: Secondary | ICD-10-CM | POA: Diagnosis not present

## 2019-09-23 DIAGNOSIS — Z96652 Presence of left artificial knee joint: Secondary | ICD-10-CM | POA: Diagnosis not present

## 2019-09-24 ENCOUNTER — Encounter: Payer: Self-pay | Admitting: *Deleted

## 2019-09-25 DIAGNOSIS — Z96652 Presence of left artificial knee joint: Secondary | ICD-10-CM | POA: Diagnosis not present

## 2019-09-25 DIAGNOSIS — M25562 Pain in left knee: Secondary | ICD-10-CM | POA: Diagnosis not present

## 2019-09-28 DIAGNOSIS — Z96652 Presence of left artificial knee joint: Secondary | ICD-10-CM | POA: Diagnosis not present

## 2019-09-28 DIAGNOSIS — M25562 Pain in left knee: Secondary | ICD-10-CM | POA: Diagnosis not present

## 2019-10-01 DIAGNOSIS — H2512 Age-related nuclear cataract, left eye: Secondary | ICD-10-CM | POA: Diagnosis not present

## 2019-10-02 DIAGNOSIS — M25562 Pain in left knee: Secondary | ICD-10-CM | POA: Diagnosis not present

## 2019-10-02 DIAGNOSIS — R7309 Other abnormal glucose: Secondary | ICD-10-CM | POA: Diagnosis not present

## 2019-10-02 DIAGNOSIS — Z96652 Presence of left artificial knee joint: Secondary | ICD-10-CM | POA: Diagnosis not present

## 2019-10-02 DIAGNOSIS — I2510 Atherosclerotic heart disease of native coronary artery without angina pectoris: Secondary | ICD-10-CM | POA: Diagnosis not present

## 2019-10-07 DIAGNOSIS — Z96652 Presence of left artificial knee joint: Secondary | ICD-10-CM | POA: Diagnosis not present

## 2019-10-07 DIAGNOSIS — M25562 Pain in left knee: Secondary | ICD-10-CM | POA: Diagnosis not present

## 2019-10-16 DIAGNOSIS — M1732 Unilateral post-traumatic osteoarthritis, left knee: Secondary | ICD-10-CM | POA: Diagnosis not present

## 2019-10-23 DIAGNOSIS — M25562 Pain in left knee: Secondary | ICD-10-CM | POA: Diagnosis not present

## 2019-10-23 DIAGNOSIS — Z96652 Presence of left artificial knee joint: Secondary | ICD-10-CM | POA: Diagnosis not present

## 2019-12-31 DIAGNOSIS — R7309 Other abnormal glucose: Secondary | ICD-10-CM | POA: Diagnosis not present

## 2019-12-31 DIAGNOSIS — I2510 Atherosclerotic heart disease of native coronary artery without angina pectoris: Secondary | ICD-10-CM | POA: Diagnosis not present

## 2020-01-07 DIAGNOSIS — R7309 Other abnormal glucose: Secondary | ICD-10-CM | POA: Diagnosis not present

## 2020-01-07 DIAGNOSIS — R300 Dysuria: Secondary | ICD-10-CM | POA: Diagnosis not present

## 2020-01-07 DIAGNOSIS — I2510 Atherosclerotic heart disease of native coronary artery without angina pectoris: Secondary | ICD-10-CM | POA: Diagnosis not present

## 2020-01-07 DIAGNOSIS — Z0000 Encounter for general adult medical examination without abnormal findings: Secondary | ICD-10-CM | POA: Diagnosis not present

## 2020-01-07 DIAGNOSIS — I7300 Raynaud's syndrome without gangrene: Secondary | ICD-10-CM | POA: Insufficient documentation

## 2020-01-25 ENCOUNTER — Other Ambulatory Visit: Payer: Self-pay | Admitting: *Deleted

## 2020-01-25 DIAGNOSIS — C61 Malignant neoplasm of prostate: Secondary | ICD-10-CM

## 2020-01-26 ENCOUNTER — Other Ambulatory Visit: Payer: Self-pay

## 2020-01-26 ENCOUNTER — Other Ambulatory Visit: Payer: PPO

## 2020-01-26 DIAGNOSIS — C61 Malignant neoplasm of prostate: Secondary | ICD-10-CM | POA: Diagnosis not present

## 2020-01-27 LAB — PSA: Prostate Specific Ag, Serum: 3.6 ng/mL (ref 0.0–4.0)

## 2020-01-29 ENCOUNTER — Ambulatory Visit: Payer: PPO | Admitting: Urology

## 2020-01-29 ENCOUNTER — Encounter: Payer: Self-pay | Admitting: Urology

## 2020-01-29 ENCOUNTER — Other Ambulatory Visit: Payer: Self-pay

## 2020-01-29 VITALS — BP 167/96 | HR 89 | Ht 71.0 in | Wt 205.0 lb

## 2020-01-29 DIAGNOSIS — C61 Malignant neoplasm of prostate: Secondary | ICD-10-CM | POA: Diagnosis not present

## 2020-01-29 DIAGNOSIS — R300 Dysuria: Secondary | ICD-10-CM

## 2020-01-29 NOTE — Progress Notes (Signed)
01/29/2020 2:46 PM   Adrian Hart [birth year] [id number]  Referring provider: Kirk Ruths, MD [facility] [place],  [place] [address]  Chief Complaint  Patient presents with  . Prostate Cancer    Urologic history: 1. cT1cadenocarcinoma prostate low risk -Biopsy 08/2018; abnormal DRE; PSA 2.7; 66 g prostate -4/12 corespositive Gleason 3+3 (LLB,LLM, LM,LA) - 1-25% involvement -Nodules described at midline apex -Elected surveillance - Prostate MRI(12/18/18)results with2 PI-RADS 4 lesions at left base and left mid gland concerning for high-grade carcinoma, no adenopathy, capsule intact, normal seminal vesicles - Fusion Biopsy:PI-RADS 4 lesions were benign, left side of biopsy positive forGleason 3+3   HPI: 69 y.o. male presents for semiannual follow-up.  Approximately 1 month ago he had onset of mild dysuria and states he was told this may be secondary to his saw palmetto which he discontinued.  He has mild daytime frequency.  Denies gross hematuria.  No flank, abdominal or pelvic pain. PSA drawn 01/26/2020 elevated above baseline at 3.6.  His mild dysuria has continued.  PMH: Past Medical History:  Diagnosis Date  . Cancer [facility])    prostate.  needs no treatment  . GERD (gastroesophageal reflux disease)    PT TAKES APPLE CIDER VINEGAR   . Hypertension    borderline pressures.  takes no meds    Surgical History: Past Surgical History:  Procedure Laterality Date  . COLONOSCOPY  2003   Dr Alveta Heimlich  . COLONOSCOPY WITH PROPOFOL N/A 05/02/2018   Procedure: COLONOSCOPY WITH PROPOFOL;  Surgeon: Manya Silvas, MD;  Location: [facility] ENDOSCOPY;  Service: Endoscopy;  Laterality: N/A;  . EYE SURGERY Right    cataract  . FLEXIBLE SIGMOIDOSCOPY  1996   Dr Bary Castilla  . HEMORRHOID SURGERY  04-20-04   Dr Bary Castilla  . HERNIA REPAIR  [id number]   Umbilical,  primary repair  . KNEE SURGERY Left 1995   arthroscopy  . PARTIAL KNEE ARTHROPLASTY Right 09/05/2017   Procedure: UNICOMPARTMENTAL KNEE;  Surgeon: Corky Mull, MD;  Location: [facility] ORS;  Service: Orthopedics;  Laterality: Right;  . PARTIAL KNEE ARTHROPLASTY Left 09/01/2019   Procedure: UNICOMPARTMENTAL KNEE;  Surgeon: Corky Mull, MD;  Location: [facility] ORS;  Service: Orthopedics;  Laterality: Left;  . TONSILLECTOMY    . UMBILICAL HERNIA REPAIR N/A 09/27/2015   Procedure: HERNIA REPAIR UMBILICAL ADULT;  Surgeon: Robert Bellow, MD;  Location: [facility] ORS;  Service: General;  Laterality: N/A;    Home Medications:  Allergies as of 01/29/2020      Reactions   Fish-derived Products Nausea And Vomiting   Reaction to mussells only   Other Nausea And Vomiting      Medication List       Accurate as of January 29, 2020  2:46 PM. If you have any questions, ask your nurse or doctor.        cephALEXin 500 MG capsule Commonly known as: KEFLEX Take 1 capsule (500 mg total) by mouth 3 (three) times daily.   enoxaparin 40 MG/0.4ML injection Commonly known as: LOVENOX Inject 0.4 mLs (40 mg total) into the skin daily.   Flaxseed Oil 1200 MG Caps Take 1,200 mg by mouth 2 (two) times daily.   fluticasone 50 MCG/ACT nasal spray Commonly known as: FLONASE Place into the nose.   Ginseng 100 MG Caps Take 100 mg by mouth daily as needed (antiinflammatory).   Magnesium 250 MG Tabs Take 250 mg by mouth daily.   meloxicam 15 MG tablet Commonly  known as: MOBIC Take 15 mg by mouth daily as needed for pain.   Multi-Vitamins Tabs Take 1 tablet by mouth daily.   oxyCODONE 5 MG immediate release tablet Commonly known as: Oxy IR/ROXICODONE Take 1-2 tablets (5-10 mg total) by mouth every 4 (four) hours as needed for moderate pain.   PSYLLIUM PO Take 3 capsules by mouth 2 (two) times daily.   SAW PALMETTO PO Take 3 capsules by mouth daily.   Super B-Complex Caps Take 1 capsule by mouth  daily. Takes half a caplet a day   tiZANidine 4 MG tablet Commonly known as: ZANAFLEX Take by mouth.   traMADol 50 MG tablet Commonly known as: ULTRAM Take 1 tablet (50 mg total) by mouth every 6 (six) hours.   traZODone 50 MG tablet Commonly known as: DESYREL Take 25 mg by mouth at bedtime as needed for sleep.       Allergies:  Allergies  Allergen Reactions  . Fish-Derived Products Nausea And Vomiting    Reaction to mussells only  . Other Nausea And Vomiting    Family History: Family History  Problem Relation Age of Onset  . Diabetes Mother   . Bladder Cancer Neg Hx   . Kidney cancer Neg Hx   . Prostate cancer Neg Hx     Social History:  reports that he quit smoking about 12 years ago. His smoking use included cigarettes. He has a 30.00 pack-year smoking history. He has never used smokeless tobacco. He reports that he does not drink alcohol or use drugs.   Physical Exam: BP (!) 167/96   Pulse 89   Ht 5\' 11"  (1.803 m)   Wt 205 lb (93 kg)   BMI 28.59 kg/m   Constitutional:  Alert and oriented, No acute distress. HEENT: [place] AT, moist mucus membranes.  Trachea midline, no masses. Cardiovascular: No clubbing, cyanosis, or edema. Respiratory: Normal respiratory effort, no increased work of breathing. GU: Prostate 50 g, smooth without nodules Neurologic: Grossly intact, no focal deficits, moving all 4 extremities. Psychiatric: Normal mood and affect.   Assessment & Plan:    -T1c prostate cancer PSA is elevated above baseline.  DRE benign.  He has had 1 month history of mild dysuria.  Urinalysis was ordered.  We will start tamsulosin 0.4 mg daily x30 days and obtain a repeat PSA in 4-6 weeks.  He will be notified with his UA results.   Abbie Sons, [place] [address] [place], [place] [address] [phone]

## 2020-01-31 ENCOUNTER — Encounter: Payer: Self-pay | Admitting: Urology

## 2020-01-31 MED ORDER — TAMSULOSIN HCL 0.4 MG PO CAPS: 0.4000 mg | ORAL_CAPSULE | Freq: Every day | ORAL | 0 refills | Status: DC

## 2020-02-09 ENCOUNTER — Encounter: Payer: Self-pay | Admitting: Urology

## 2020-02-24 ENCOUNTER — Telehealth: Payer: Self-pay | Admitting: *Deleted

## 2020-02-24 ENCOUNTER — Other Ambulatory Visit: Payer: Self-pay | Admitting: Urology

## 2020-02-24 NOTE — Telephone Encounter (Signed)
A refill came in on flomax . Patient states he did not like taking the meds. He states he was getting up in the middle of the night more to urinate , when taking Flomax

## 2020-03-04 ENCOUNTER — Other Ambulatory Visit: Payer: Self-pay

## 2020-03-11 ENCOUNTER — Other Ambulatory Visit: Payer: Self-pay

## 2020-03-14 ENCOUNTER — Other Ambulatory Visit: Payer: Self-pay | Admitting: *Deleted

## 2020-03-14 DIAGNOSIS — C61 Malignant neoplasm of prostate: Secondary | ICD-10-CM

## 2020-03-15 ENCOUNTER — Other Ambulatory Visit: Payer: Self-pay

## 2020-03-15 ENCOUNTER — Other Ambulatory Visit: Payer: PPO

## 2020-03-15 DIAGNOSIS — C61 Malignant neoplasm of prostate: Secondary | ICD-10-CM

## 2020-03-16 ENCOUNTER — Telehealth: Payer: Self-pay | Admitting: Family Medicine

## 2020-03-16 LAB — PSA: Prostate Specific Ag, Serum: 2.9 ng/mL (ref 0.0–4.0)

## 2020-03-21 NOTE — Telephone Encounter (Signed)
He needs a follow-up appointment with PSA/DRE October 2021

## 2020-05-10 IMAGING — CT CT CHEST LUNG CANCER SCREENING LOW DOSE W/O CM
2 of 5 series · 15 of 40 positions shown, 18 images · non-contrast
Comparison: Low-dose lung cancer screening CT chest dated
09/02/2018

CLINICAL DATA: 68-year-old male, former smoker, quit 11 years ago,
with 30 pack-year history of smoking, for follow-up lung cancer
screening

EXAM:
CT CHEST WITHOUT CONTRAST LOW-DOSE FOR LUNG CANCER SCREENING
TECHNIQUE: Multidetector CT imaging of the chest was performed following the
standard protocol without IV contrast.

[Series 3: lung · axial · 0.80mm/px · z∈[-1205,-860]mm · 12 of 387 slices shown, 15 images]
[im 21/387  mediastinal]
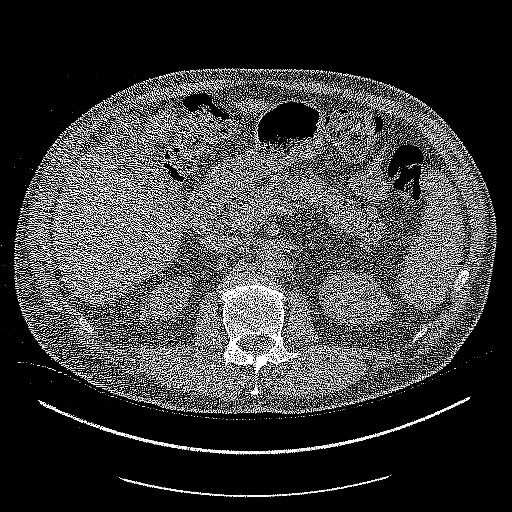
[im 21/387  lung]
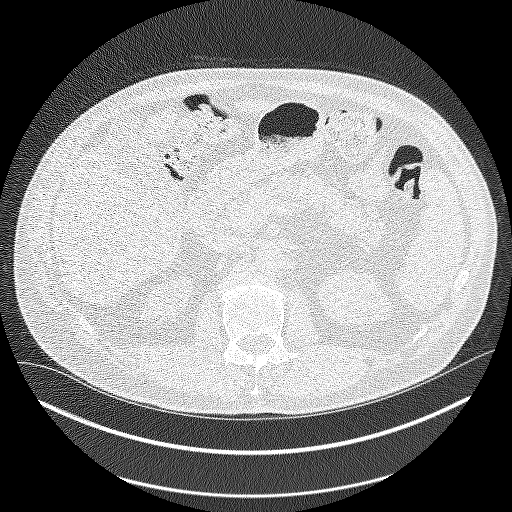
[im 61/387  lung]
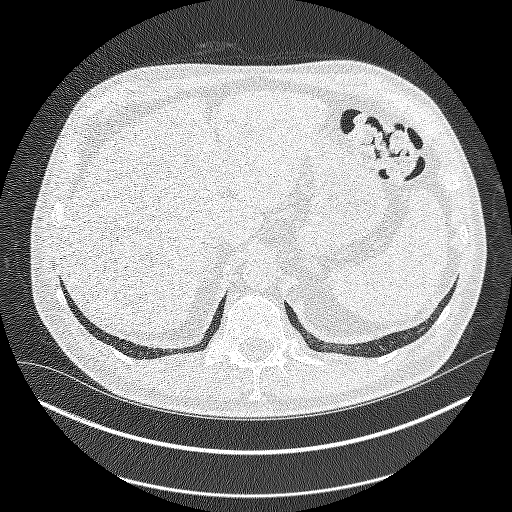
[im 82/387  lung]
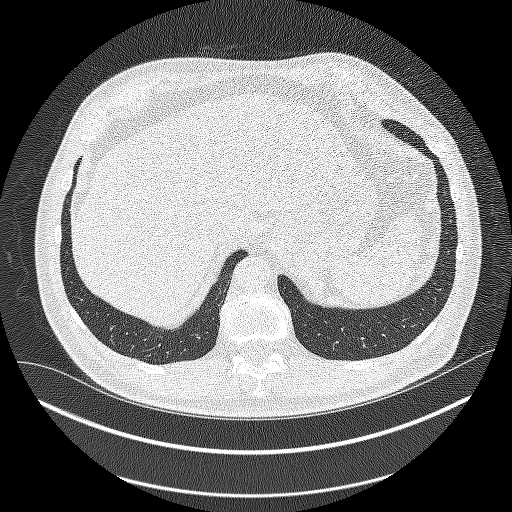
[im 122/387  lung]
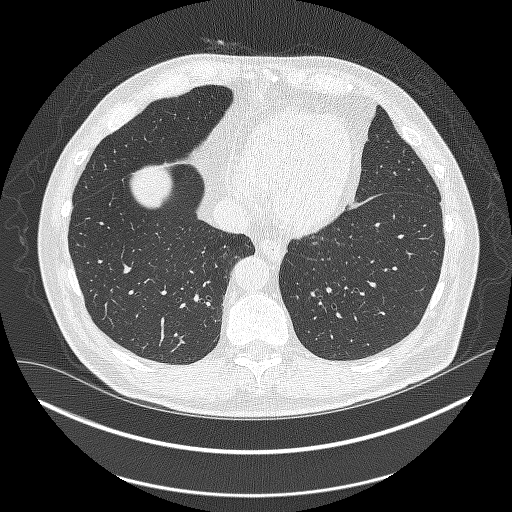
[im 143/387  mediastinal]
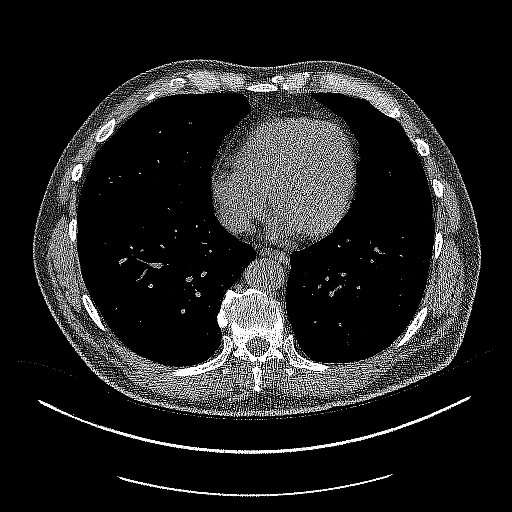
[im 143/387  lung]
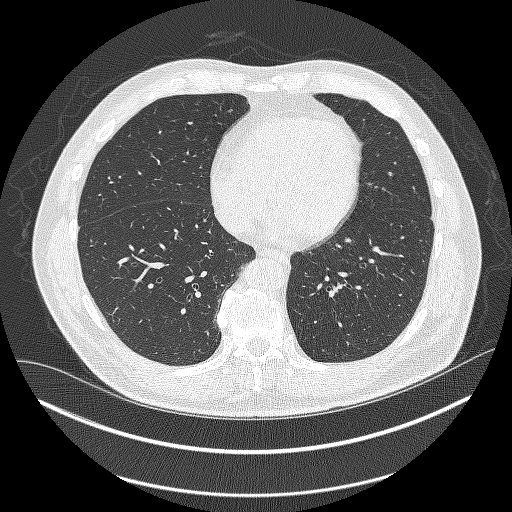
[im 183/387  lung]
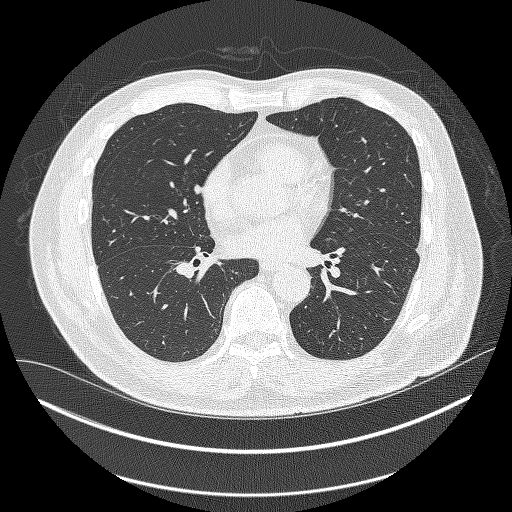
[im 204/387  lung]
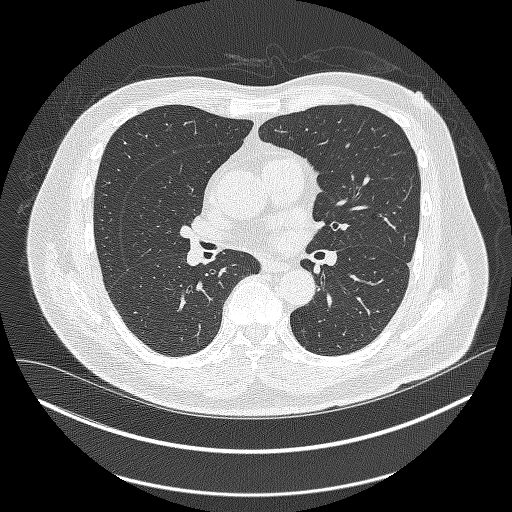
[im 244/387  lung]
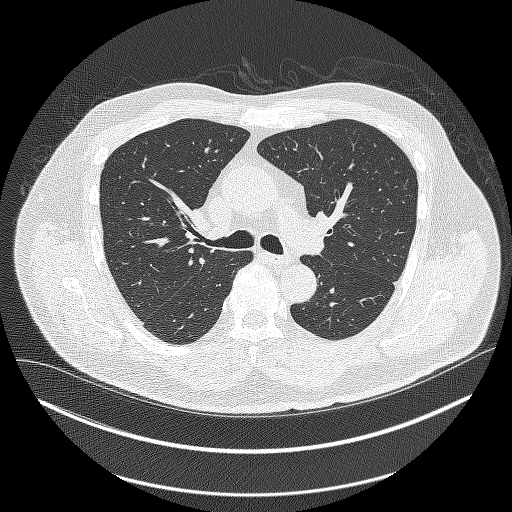
[im 265/387  mediastinal]
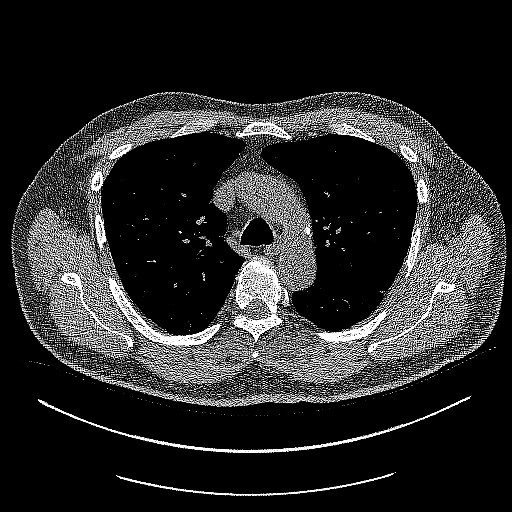
[im 265/387  lung]
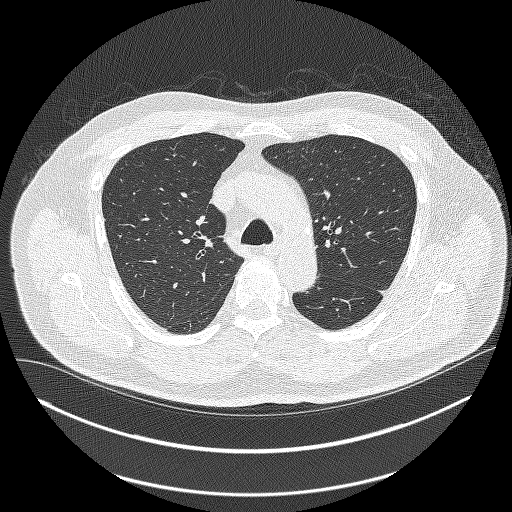
[im 305/387  lung]
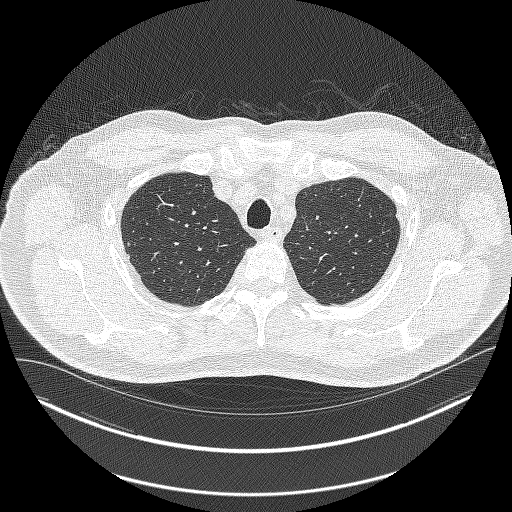
[im 326/387  lung]
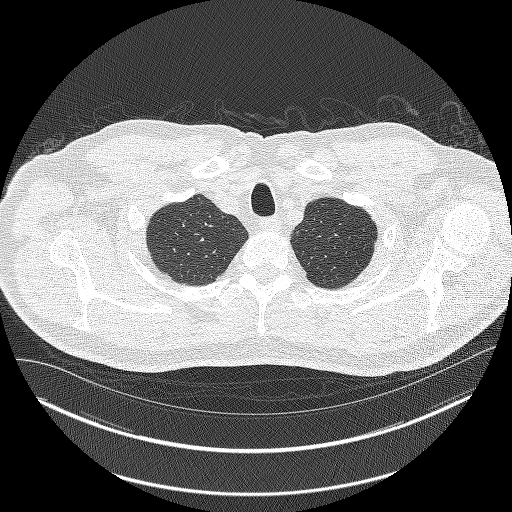
[im 366/387  lung]
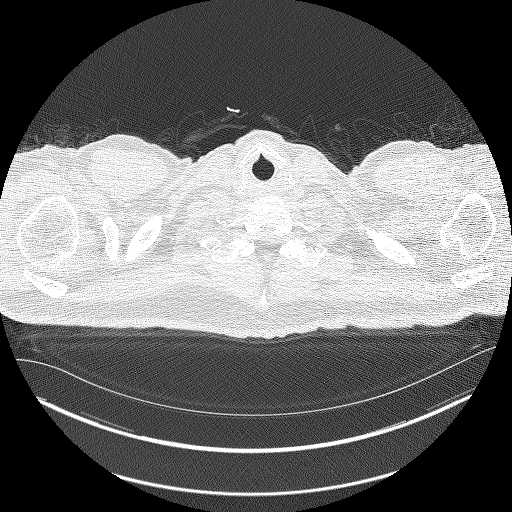

[Series 4: coronals lung · coronal · 0.76mm/px · 3 of 385 slices shown]
[im 77/385  lung]
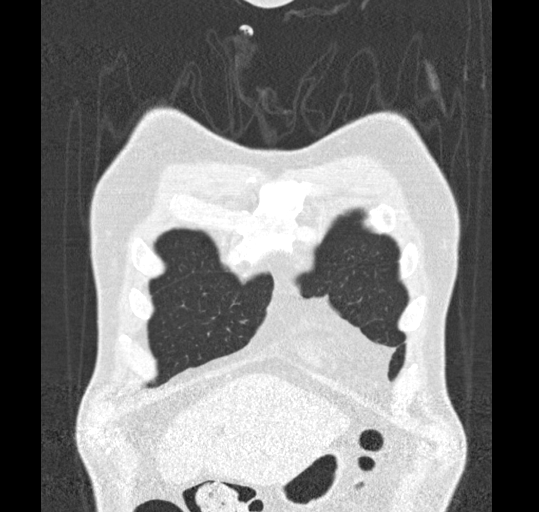
[im 154/385  lung]
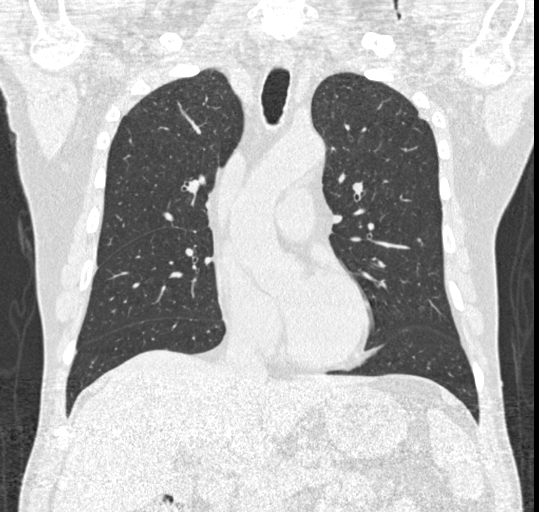
[im 231/385  lung]
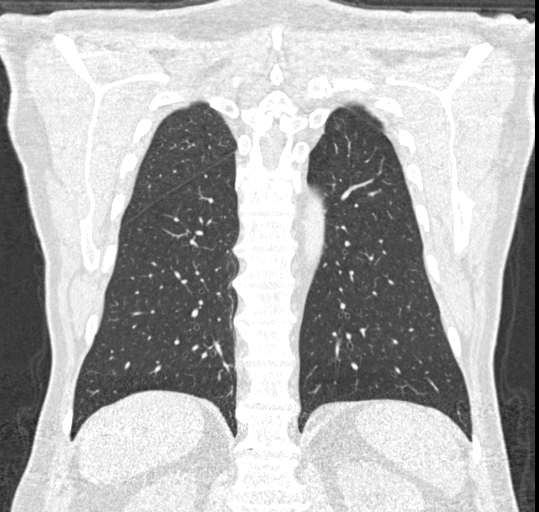

[15 of 40 positions shown; findings below may reference images not displayed]

FINDINGS: Cardiovascular: Heart is normal in size.  No pericardial effusion.

No evidence thoracic aortic aneurysm. Mild atherosclerotic
calcifications of the aortic arch.

Mild sclerosis of the LAD.

Mediastinum/Nodes: No suspicious mediastinal lymphadenopathy.

Visualized thyroid is unremarkable.

Lungs/Pleura: Biapical pleural-parenchymal scarring.

Mild centrilobular emphysematous changes, upper lung predominant.

No focal consolidation.

Small bilateral pulmonary nodules, including a 2.4 mm subpleural
nodule in the right upper lobe and a 3.5 mm ground-glass subpleural
nodule in the left upper lobe.

3.0 mm calcified granuloma in the left lower lobe, benign.

No pleural effusion or pneumothorax.

Upper Abdomen: Visualized upper abdomen is notable for low-density
thickening of the bilateral adrenal glands, likely reflecting
adrenal hyperplasia, benign.

Musculoskeletal: Mild degenerative changes of the visualized
thoracolumbar spine.
IMPRESSION: Lung-RADS 2, benign appearance or behavior. Continue annual
screening with low-dose chest CT without contrast in 12 months.

Aortic Atherosclerosis (LSAG7-YQ8.8) and Emphysema (LSAG7-NGO.J).

## 2020-05-16 ENCOUNTER — Ambulatory Visit: Payer: PPO | Admitting: Urology

## 2020-05-16 ENCOUNTER — Encounter: Payer: Self-pay | Admitting: Urology

## 2020-05-16 ENCOUNTER — Other Ambulatory Visit: Payer: Self-pay

## 2020-05-16 VITALS — BP 136/80 | HR 83 | Ht 71.0 in | Wt 194.0 lb

## 2020-05-16 DIAGNOSIS — N5201 Erectile dysfunction due to arterial insufficiency: Secondary | ICD-10-CM | POA: Diagnosis not present

## 2020-05-16 NOTE — Progress Notes (Signed)
PATIENT ID: Adrian Hart, male     DOB: 1951-06-27, 69 y.o.     MRN: [id number]   ENCOUNTER: 05/17/20, 3:59 PM     REFERRING PROVIDER: Kirk Ruths, MD [place],  [place] [address]  Chief Complaint  Patient presents with  . Erectile Dysfunction   Urologic history: 1. cT1cadenocarcinoma prostate low risk -Biopsy 08/2018; abnormal DRE; PSA 2.7; 66 g prostate -4/12 corespositive Gleason 3+3 (LLB,LLM, LM,LA) - 1-25% involvement -Nodules described at midline apex -Elected surveillance - Prostate MRI(12/18/18)results with2 PI-RADS 4 lesions at left base and left mid gland concerning for high-grade carcinoma, no adenopathy, capsule intact, normal seminal vesicles - Fusion Biopsy:PI-RADS 4 lesions were benign, left side of biopsy positive forGleason 3+3   HPI: Adrian Hart is a 69 y.o. male who presents today with symptoms of ED.    Pt notes wife died 3 years ago, and he only recently has started to become sexually active again  Symptoms have presented for about 3 months  He is able to ejaculate, but has difficulty staying firm enough for penetration  Pt notes his ejaculate has a yellowish tint, no pain or discomfort  Organic risk factors coronary artery disease and 30-pack-year smoking history; quit 12 years ago  No previous treatment for his ED  PMHx: Past Medical History:  Diagnosis Date  . Cancer [facility])    prostate.  needs no treatment  . GERD (gastroesophageal reflux disease)    PT TAKES APPLE CIDER VINEGAR   . Hypertension    borderline pressures.  takes no meds    SURGICAL HISTORY: Past Surgical History:  Procedure Laterality Date  . COLONOSCOPY  2003   Dr Alveta Heimlich  . COLONOSCOPY WITH PROPOFOL N/A 05/02/2018   Procedure: COLONOSCOPY WITH PROPOFOL;  Surgeon: Manya Silvas, MD;  Location: [facility] ENDOSCOPY;  Service:  Endoscopy;  Laterality: N/A;  . EYE SURGERY Right    cataract  . FLEXIBLE SIGMOIDOSCOPY  1996   Dr Bary Castilla  . HEMORRHOID SURGERY  04-20-04   Dr Bary Castilla  . HERNIA REPAIR  [id number]   Umbilical, primary repair  . KNEE SURGERY Left 1995   arthroscopy  . PARTIAL KNEE ARTHROPLASTY Right 09/05/2017   Procedure: UNICOMPARTMENTAL KNEE;  Surgeon: Corky Mull, MD;  Location: [facility] ORS;  Service: Orthopedics;  Laterality: Right;  . PARTIAL KNEE ARTHROPLASTY Left 09/01/2019   Procedure: UNICOMPARTMENTAL KNEE;  Surgeon: Corky Mull, MD;  Location: [facility] ORS;  Service: Orthopedics;  Laterality: Left;  . TONSILLECTOMY    . UMBILICAL HERNIA REPAIR N/A 09/27/2015   Procedure: HERNIA REPAIR UMBILICAL ADULT;  Surgeon: Robert Bellow, MD;  Location: [facility] ORS;  Service: General;  Laterality: N/A;    HOME MEDICATIONS:  Allergies as of 05/16/2020      Reactions   Fish-derived Products Nausea And Vomiting   Reaction to mussells only   Other Nausea And Vomiting      Medication List       Accurate as of May 16, 2020 11:59 PM. If you have any questions, ask your nurse or doctor.        cephALEXin 500 MG capsule Commonly known as: KEFLEX Take 1 capsule (500 mg total) by mouth 3 (three) times daily.   enoxaparin 40 MG/0.4ML injection Commonly known as: LOVENOX Inject 0.4 mLs (40 mg total) into the skin daily.   Flaxseed Oil 1200 MG Caps Take 1,200 mg by mouth 2 (two) times daily.   fluticasone 50 MCG/ACT  nasal spray Commonly known as: FLONASE Place into the nose.   Ginseng 100 MG Caps Take 100 mg by mouth daily as needed (antiinflammatory).   Magnesium 250 MG Tabs Take 250 mg by mouth daily.   meloxicam 15 MG tablet Commonly known as: MOBIC Take 15 mg by mouth daily as needed for pain.   Multi-Vitamins Tabs Take 1 tablet by mouth daily.   oxyCODONE 5 MG immediate release tablet Commonly known as: Oxy IR/ROXICODONE Take 1-2 tablets (5-10 mg total) by mouth every 4 (four) hours  as needed for moderate pain.   PSYLLIUM PO Take 3 capsules by mouth 2 (two) times daily.   SAW PALMETTO PO Take 3 capsules by mouth daily.   Super B-Complex Caps Take 1 capsule by mouth daily. Takes half a caplet a day   tamsulosin 0.4 MG Caps capsule Commonly known as: FLOMAX Take 1 capsule (0.4 mg total) by mouth daily.   tiZANidine 4 MG tablet Commonly known as: ZANAFLEX Take by mouth.   traMADol 50 MG tablet Commonly known as: ULTRAM Take 1 tablet (50 mg total) by mouth every 6 (six) hours.   traZODone 50 MG tablet Commonly known as: DESYREL Take 25 mg by mouth at bedtime as needed for sleep.       ALLERGIES: Allergies  Allergen Reactions  . Fish-Derived Products Nausea And Vomiting    Reaction to mussells only  . Other Nausea And Vomiting    FAMILY HISTORY: Family History  Problem Relation Age of Onset  . Diabetes Mother   . Bladder Cancer Neg Hx   . Kidney cancer Neg Hx   . Prostate cancer Neg Hx     SOCIAL HISTORY:  reports that he quit smoking about 12 years ago. His smoking use included cigarettes. He has a 30.00 pack-year smoking history. He has never used smokeless tobacco. He reports that he does not drink alcohol and does not use drugs.  PHYSICAL EXAM: BP 136/80   Pulse 83   Ht 5\' 11"  (1.803 m)   Wt 194 lb (88 kg)   BMI 27.06 kg/m   Constitutional:  Alert and oriented, No acute distress. HEENT: [place] AT, moist mucus membranes.  Trachea midline, no masses. Cardiovascular: No clubbing, cyanosis, or edema. Respiratory: Normal respiratory effort, no increased work of breathing. Neurologic: Grossly intact, no focal deficits, moving all 4 extremities. Psychiatric: Normal mood and affect.     ASSESSMENT/PLAN:   1. T1c Prostate Cancer  On active surveillance  Keep 09/2020 follow-up  2. Erectile Dysfunction   Management options were discussed including PDE 5 inhibitors.  We discussed both sildenafil and tadalafil with most common side  effects.  Efficacy of these medications were reviewed at approximately 70% and he was informed if no contraindications these are generally first-line ED treatments  Second line treatments including intracavernosal injections and vacuum erection devices were also discussed  He has elected PDE 5 inhibitor and Rx tadalafil 20 mg sent to [facility] along with a good Rx coupon.  I recommended he try this medication on at least 5 different occasions to adequately determine efficacy   Abbie Sons, MD   [facility] [address] [place], [place] [address] [phone]  By signing my name below, I, [employer], attest that this documentation has been prepared under the direction and in the presence of John Giovanni, MD. Electronically Signed: Abbie Sons, MD 05/17/20, 3:59 PM   I have reviewed the above documentation for accuracy  and completeness, and I agree with the above.   Abbie Sons, MD

## 2020-05-17 MED ORDER — TADALAFIL 20 MG PO TABS: ORAL_TABLET | ORAL | 0 refills | Status: DC

## 2020-05-19 ENCOUNTER — Encounter: Payer: Self-pay | Admitting: Urology

## 2020-06-05 ENCOUNTER — Emergency Department: Payer: PPO

## 2020-06-05 ENCOUNTER — Encounter: Payer: Self-pay | Admitting: Emergency Medicine

## 2020-06-05 ENCOUNTER — Other Ambulatory Visit: Payer: Self-pay

## 2020-06-05 ENCOUNTER — Inpatient Hospital Stay
Admission: EM | Admit: 2020-06-05 | Discharge: 2020-06-09 | DRG: 390 | Disposition: A | Discharge status: Home / Self Care | Payer: PPO | Attending: Surgery | Admitting: Surgery

## 2020-06-05 DIAGNOSIS — Z833 Family history of diabetes mellitus: Secondary | ICD-10-CM

## 2020-06-05 DIAGNOSIS — I10 Essential (primary) hypertension: Secondary | ICD-10-CM | POA: Diagnosis not present

## 2020-06-05 DIAGNOSIS — Z03818 Encounter for observation for suspected exposure to other biological agents ruled out: Secondary | ICD-10-CM | POA: Diagnosis not present

## 2020-06-05 DIAGNOSIS — E876 Hypokalemia: Secondary | ICD-10-CM | POA: Diagnosis present

## 2020-06-05 DIAGNOSIS — I878 Other specified disorders of veins: Secondary | ICD-10-CM | POA: Diagnosis not present

## 2020-06-05 DIAGNOSIS — K6389 Other specified diseases of intestine: Secondary | ICD-10-CM | POA: Diagnosis not present

## 2020-06-05 DIAGNOSIS — R000 Tachycardia, unspecified: Secondary | ICD-10-CM | POA: Diagnosis not present

## 2020-06-05 DIAGNOSIS — K5669 Other partial intestinal obstruction: Secondary | ICD-10-CM | POA: Diagnosis not present

## 2020-06-05 DIAGNOSIS — Z79899 Other long term (current) drug therapy: Secondary | ICD-10-CM | POA: Diagnosis not present

## 2020-06-05 DIAGNOSIS — Z79891 Long term (current) use of opiate analgesic: Secondary | ICD-10-CM

## 2020-06-05 DIAGNOSIS — K5939 Other megacolon: Secondary | ICD-10-CM | POA: Diagnosis not present

## 2020-06-05 DIAGNOSIS — Z7901 Long term (current) use of anticoagulants: Secondary | ICD-10-CM

## 2020-06-05 DIAGNOSIS — Z87891 Personal history of nicotine dependence: Secondary | ICD-10-CM

## 2020-06-05 DIAGNOSIS — K56600 Partial intestinal obstruction, unspecified as to cause: Principal | ICD-10-CM | POA: Diagnosis present

## 2020-06-05 DIAGNOSIS — Z4682 Encounter for fitting and adjustment of non-vascular catheter: Secondary | ICD-10-CM | POA: Diagnosis not present

## 2020-06-05 DIAGNOSIS — R1110 Vomiting, unspecified: Secondary | ICD-10-CM | POA: Diagnosis not present

## 2020-06-05 DIAGNOSIS — K56609 Unspecified intestinal obstruction, unspecified as to partial versus complete obstruction: Secondary | ICD-10-CM | POA: Diagnosis not present

## 2020-06-05 DIAGNOSIS — Z20822 Contact with and (suspected) exposure to covid-19: Secondary | ICD-10-CM | POA: Diagnosis not present

## 2020-06-05 DIAGNOSIS — K56699 Other intestinal obstruction unspecified as to partial versus complete obstruction: Secondary | ICD-10-CM | POA: Diagnosis not present

## 2020-06-05 DIAGNOSIS — C61 Malignant neoplasm of prostate: Secondary | ICD-10-CM | POA: Diagnosis not present

## 2020-06-05 LAB — URINALYSIS, COMPLETE (UACMP) WITH MICROSCOPIC
Bacteria, UA: NONE SEEN
Bilirubin Urine: NEGATIVE
Glucose, UA: NEGATIVE mg/dL
Hgb urine dipstick: NEGATIVE
Ketones, ur: NEGATIVE mg/dL
Leukocytes,Ua: NEGATIVE
Nitrite: NEGATIVE
Protein, ur: NEGATIVE mg/dL
Specific Gravity, Urine: 1.045 — ABNORMAL HIGH (ref 1.005–1.030)
pH: 5 (ref 5.0–8.0)

## 2020-06-05 LAB — BASIC METABOLIC PANEL
Anion gap: 12 (ref 5–15)
BUN: 27 mg/dL — ABNORMAL HIGH (ref 8–23)
CO2: 30 mmol/L (ref 22–32)
Calcium: 9.3 mg/dL (ref 8.9–10.3)
Chloride: 97 mmol/L — ABNORMAL LOW (ref 98–111)
Creatinine, Ser: 0.87 mg/dL (ref 0.61–1.24)
GFR calc Af Amer: >60 mL/min (ref >60)
GFR calc non Af Amer: >60 mL/min (ref >60)
Glucose, Bld: 124 mg/dL — ABNORMAL HIGH (ref 70–99)
Potassium: 3.3 mmol/L — ABNORMAL LOW (ref 3.5–5.1)
Sodium: 139 mmol/L (ref 135–145)

## 2020-06-05 LAB — COMPREHENSIVE METABOLIC PANEL
ALT: 24 U/L (ref 0–44)
AST: 20 U/L (ref 15–41)
Albumin: 4.7 g/dL (ref 3.5–5.0)
Alkaline Phosphatase: 75 U/L (ref 38–126)
Anion gap: 15 (ref 5–15)
BUN: 30 mg/dL — ABNORMAL HIGH (ref 8–23)
CO2: 25 mmol/L (ref 22–32)
Calcium: 10.1 mg/dL (ref 8.9–10.3)
Chloride: 100 mmol/L (ref 98–111)
Creatinine, Ser: 0.94 mg/dL (ref 0.61–1.24)
GFR calc Af Amer: >60 mL/min (ref >60)
GFR calc non Af Amer: >60 mL/min (ref >60)
Glucose, Bld: 172 mg/dL — ABNORMAL HIGH (ref 70–99)
Potassium: 4 mmol/L (ref 3.5–5.1)
Sodium: 140 mmol/L (ref 135–145)
Total Bilirubin: 1.2 mg/dL (ref 0.3–1.2)
Total Protein: 8.1 g/dL (ref 6.5–8.1)

## 2020-06-05 LAB — CBC
HCT: 51.3 % (ref 39.0–52.0)
HCT: 47.6 % (ref 39.0–52.0)
Hemoglobin: 17.9 g/dL — ABNORMAL HIGH (ref 13.0–17.0)
Hemoglobin: 16.9 g/dL (ref 13.0–17.0)
MCH: 29.5 pg (ref 26.0–34.0)
MCH: 29.6 pg (ref 26.0–34.0)
MCHC: 34.9 g/dL (ref 30.0–36.0)
MCHC: 35.5 g/dL (ref 30.0–36.0)
MCV: 84.5 fL (ref 80.0–100.0)
MCV: 83.5 fL (ref 80.0–100.0)
Platelets: 346 10*3/uL (ref 150–400)
Platelets: 271 10*3/uL (ref 150–400)
RBC: 6.07 MIL/uL — ABNORMAL HIGH (ref 4.22–5.81)
RBC: 5.7 MIL/uL (ref 4.22–5.81)
RDW: 13.6 % (ref 11.5–15.5)
RDW: 13.8 % (ref 11.5–15.5)
WBC: 21.6 10*3/uL — ABNORMAL HIGH (ref 4.0–10.5)
WBC: 17.9 10*3/uL — ABNORMAL HIGH (ref 4.0–10.5)
nRBC: 0 % (ref 0.0–0.2)
nRBC: 0 % (ref 0.0–0.2)

## 2020-06-05 LAB — SARS CORONAVIRUS 2 BY RT PCR (HOSPITAL ORDER, PERFORMED IN ~~LOC~~ HOSPITAL LAB): SARS Coronavirus 2: NEGATIVE

## 2020-06-05 LAB — PHOSPHORUS: Phosphorus: 4.8 mg/dL — ABNORMAL HIGH (ref 2.5–4.6)

## 2020-06-05 LAB — LACTIC ACID, PLASMA: Lactic Acid, Venous: 1.3 mmol/L (ref 0.5–1.9)

## 2020-06-05 LAB — MAGNESIUM: Magnesium: 2.1 mg/dL (ref 1.7–2.4)

## 2020-06-05 LAB — LIPASE, BLOOD: Lipase: 24 U/L (ref 11–51)

## 2020-06-05 MED ORDER — SODIUM CHLORIDE 0.9 % IV BOLUS
500.0000 mL | Freq: Once | INTRAVENOUS | Status: AC
  Administered 2020-06-05: 500 mL via INTRAVENOUS

## 2020-06-05 MED ORDER — ACETAMINOPHEN 10 MG/ML IV SOLN
1000.0000 mg | Freq: Four times a day (QID) | INTRAVENOUS | Status: AC
  Administered 2020-06-05 – 2020-06-06 (×4): 1000 mg via INTRAVENOUS
  Filled 2020-06-05 (×4): qty 100

## 2020-06-05 MED ORDER — MORPHINE SULFATE (PF) 4 MG/ML IV SOLN
4.0000 mg | Freq: Once | INTRAVENOUS | Status: AC
  Administered 2020-06-05: 4 mg via INTRAVENOUS
  Filled 2020-06-05: qty 1

## 2020-06-05 MED ORDER — PANTOPRAZOLE SODIUM 40 MG IV SOLR
40.0000 mg | Freq: Every day | INTRAVENOUS | Status: DC
  Administered 2020-06-05 – 2020-06-08 (×4): 40 mg via INTRAVENOUS
  Filled 2020-06-05 (×4): qty 40

## 2020-06-05 MED ORDER — ONDANSETRON HCL 4 MG/2ML IJ SOLN
4.0000 mg | Freq: Once | INTRAMUSCULAR | Status: AC
  Administered 2020-06-05: 4 mg via INTRAVENOUS
  Filled 2020-06-05: qty 2

## 2020-06-05 MED ORDER — POTASSIUM CHLORIDE 10 MEQ/100ML IV SOLN
10.0000 meq | INTRAVENOUS | Status: AC
  Administered 2020-06-05 (×4): 10 meq via INTRAVENOUS
  Filled 2020-06-05 (×4): qty 100

## 2020-06-05 MED ORDER — HYDRALAZINE HCL 20 MG/ML IJ SOLN: 10.0000 mg | INTRAMUSCULAR | Status: DC | PRN

## 2020-06-05 MED ORDER — KETOROLAC TROMETHAMINE 15 MG/ML IJ SOLN
15.0000 mg | Freq: Four times a day (QID) | INTRAMUSCULAR | Status: DC | PRN
  Administered 2020-06-05 – 2020-06-08 (×4): 15 mg via INTRAVENOUS
  Filled 2020-06-05 (×4): qty 1

## 2020-06-05 MED ORDER — PROCHLORPERAZINE MALEATE 10 MG PO TABS
10.0000 mg | ORAL_TABLET | Freq: Four times a day (QID) | ORAL | Status: DC | PRN
  Filled 2020-06-05: qty 1

## 2020-06-05 MED ORDER — MENTHOL 3 MG MT LOZG
1.0000 | LOZENGE | OROMUCOSAL | Status: DC | PRN
  Administered 2020-06-06: 3 mg via ORAL
  Filled 2020-06-05: qty 9

## 2020-06-05 MED ORDER — IOHEXOL 300 MG/ML  SOLN
100.0000 mL | Freq: Once | INTRAMUSCULAR | Status: AC | PRN
  Administered 2020-06-05: 100 mL via INTRAVENOUS

## 2020-06-05 MED ORDER — IOHEXOL 9 MG/ML PO SOLN
500.0000 mL | Freq: Two times a day (BID) | ORAL | Status: DC | PRN
  Administered 2020-06-05: 500 mL via ORAL

## 2020-06-05 MED ORDER — ENOXAPARIN SODIUM 40 MG/0.4ML ~~LOC~~ SOLN
40.0000 mg | SUBCUTANEOUS | Status: DC
  Administered 2020-06-05 – 2020-06-08 (×4): 40 mg via SUBCUTANEOUS
  Filled 2020-06-05 (×4): qty 0.4

## 2020-06-05 MED ORDER — MORPHINE SULFATE (PF) 4 MG/ML IV SOLN
4.0000 mg | INTRAVENOUS | Status: DC | PRN
  Administered 2020-06-05 – 2020-06-07 (×5): 4 mg via INTRAVENOUS
  Filled 2020-06-05 (×5): qty 1

## 2020-06-05 MED ORDER — DEXTROSE-NACL 5-0.9 % IV SOLN: INTRAVENOUS | Status: DC

## 2020-06-05 MED ORDER — PROCHLORPERAZINE EDISYLATE 10 MG/2ML IJ SOLN: 5.0000 mg | Freq: Four times a day (QID) | INTRAMUSCULAR | Status: DC | PRN

## 2020-06-05 MED ORDER — ONDANSETRON 4 MG PO TBDP: 4.0000 mg | ORAL_TABLET | Freq: Four times a day (QID) | ORAL | Status: DC | PRN

## 2020-06-05 MED ORDER — ONDANSETRON HCL 4 MG/2ML IJ SOLN
4.0000 mg | Freq: Four times a day (QID) | INTRAMUSCULAR | Status: DC | PRN
  Administered 2020-06-06 (×2): 4 mg via INTRAVENOUS
  Filled 2020-06-05 (×2): qty 2

## 2020-06-05 NOTE — ED Triage Notes (Signed)
Patient with complaint of left side abdominal pain, vomting and dizzy since Thursday.

## 2020-06-05 NOTE — ED Notes (Signed)
Pt given TV remote

## 2020-06-05 NOTE — ED Notes (Signed)
CT notified pt done with oral contrast.

## 2020-06-05 NOTE — Plan of Care (Signed)
Continuing with plan of care. 

## 2020-06-05 NOTE — ED Notes (Signed)
NG tube in right naris advanced 5 cm.

## 2020-06-05 NOTE — ED Notes (Signed)
This rn transported pt by stretcher at this time

## 2020-06-05 NOTE — ED Notes (Signed)
Returned from CT.

## 2020-06-05 NOTE — H&P (Signed)
Patient ID: Adrian Hart, male   DOB: 1951/09/30, 69 y.o.   MRN: [id number]  HPI Adrian Hart is a 69 y.o. male same to the emergency room complaining of abdominal pain nausea and vomiting.  He reports that the pain is colicky type moderate intensity and diffuse.  No specific alleviating or aggravating factors.  Reports that for the last 3 days he has been having some symptoms and he also feels dizzy.  Of note he does have a significant surgical history consistent of a primary open umbilical hernia repair by Dr. Bary Castilla 5 years ago.  No flatus or bowel movements today last bowel movement was yesterday a and was very small.  He did have a CT scan of the abdomen pelvis that I have personally reviewed showing evidence of dilated small bowel with decompressed terminal ileum.  There is no evidence of free air there is no evidence of pneumatosis or internal hernia.  He does have a white count of 17.9 and he is hemoconcentrated.  With a hemoglobin of 6.9.  He does have BUN of 27 and mild hypokalemia of 3.3.    HPI  Past Medical History:  Diagnosis Date  . Cancer [facility])    prostate.  needs no treatment  . GERD (gastroesophageal reflux disease)    PT TAKES APPLE CIDER VINEGAR   . Hypertension    borderline pressures.  takes no meds    Past Surgical History:  Procedure Laterality Date  . COLONOSCOPY  2003   Dr Alveta Heimlich  . COLONOSCOPY WITH PROPOFOL N/A 05/02/2018   Procedure: COLONOSCOPY WITH PROPOFOL;  Surgeon: Manya Silvas, MD;  Location: [facility] ENDOSCOPY;  Service: Endoscopy;  Laterality: N/A;  . EYE SURGERY Right    cataract  . FLEXIBLE SIGMOIDOSCOPY  1996   Dr Bary Castilla  . HEMORRHOID SURGERY  04-20-04   Dr Bary Castilla  . HERNIA REPAIR  [id number]   Umbilical, primary repair  . KNEE SURGERY Left 1995   arthroscopy  . PARTIAL KNEE ARTHROPLASTY Right 09/05/2017   Procedure: UNICOMPARTMENTAL KNEE;  Surgeon: Corky Mull, MD;  Location: [facility] ORS;  Service: Orthopedics;  Laterality: Right;  . PARTIAL  KNEE ARTHROPLASTY Left 09/01/2019   Procedure: UNICOMPARTMENTAL KNEE;  Surgeon: Corky Mull, MD;  Location: [facility] ORS;  Service: Orthopedics;  Laterality: Left;  . TONSILLECTOMY    . UMBILICAL HERNIA REPAIR N/A 09/27/2015   Procedure: HERNIA REPAIR UMBILICAL ADULT;  Surgeon: Robert Bellow, MD;  Location: [facility] ORS;  Service: General;  Laterality: N/A;    Family History  Problem Relation Age of Onset  . Diabetes Mother   . Bladder Cancer Neg Hx   . Kidney cancer Neg Hx   . Prostate cancer Neg Hx     Social History Social History   Tobacco Use  . Smoking status: Former Smoker    Packs/day: 0.75    Years: 40.00    Pack years: 30.00    Types: Cigarettes    Quit date: 2009    Years since quitting: 12.5  . Smokeless tobacco: Never Used  Vaping Use  . Vaping Use: Never used  Substance Use Topics  . Alcohol use: No    Alcohol/week: 0.0 standard drinks  . Drug use: No    Allergies  Allergen Reactions  . Fish-Derived Products Nausea And Vomiting    Reaction to mussells only  . Other Nausea And Vomiting    Current Facility-Administered Medications  Medication Dose Route Frequency Provider Last Rate Last Admin  . acetaminophen (OFIRMEV)  IV 1,000 mg  1,000 mg Intravenous Q6H Labrittany Wechter F, MD      . dextrose 5 %-0.9 % sodium chloride infusion   Intravenous Continuous Jules Husbands, MD 125 mL/hr at 06/05/20 1414 New Bag at 06/05/20 1414  . enoxaparin (LOVENOX) injection 40 mg  40 mg Subcutaneous Q24H Genessis Flanary, [place] F, MD      . hydrALAZINE (APRESOLINE) injection 10 mg  10 mg Intravenous Q2H PRN Aireal Slater F, MD      . iohexol (OMNIPAQUE) 9 MG/ML oral solution 500 mL  500 mL Oral BID PRN Delman Kitten, MD   500 mL at 06/05/20 0815  . ketorolac (TORADOL) 15 MG/ML injection 15 mg  15 mg Intravenous Q6H PRN Mirren Gest F, MD      . morphine 4 MG/ML injection 4 mg  4 mg Intravenous Q3H PRN Etha Stambaugh F, MD      . ondansetron (ZOFRAN-ODT) disintegrating tablet 4 mg  4 mg Oral Q6H  PRN Margarite Vessel F, MD       Or  . ondansetron (ZOFRAN) injection 4 mg  4 mg Intravenous Q6H PRN Bryssa Tones F, MD      . pantoprazole (PROTONIX) injection 40 mg  40 mg Intravenous QHS Taggart Prasad F, MD      . prochlorperazine (COMPAZINE) tablet 10 mg  10 mg Oral Q6H PRN Avenly Roberge F, MD       Or  . prochlorperazine (COMPAZINE) injection 5-10 mg  5-10 mg Intravenous Q6H PRN Trystin Hargrove, Marjory Lies, MD       Current Outpatient Medications  Medication Sig Dispense Refill  . B Complex-Biotin-FA (SUPER B-COMPLEX) CAPS Take 1 capsule by mouth daily. Takes half a caplet a day    . cephALEXin (KEFLEX) 500 MG capsule Take 1 capsule (500 mg total) by mouth 3 (three) times daily. 21 capsule 0  . enoxaparin (LOVENOX) 40 MG/0.4ML injection Inject 0.4 mLs (40 mg total) into the skin daily. 5.6 mL 0  . Flaxseed, Linseed, (FLAXSEED OIL) 1200 MG CAPS Take 1,200 mg by mouth 2 (two) times daily.    . fluticasone (FLONASE) 50 MCG/ACT nasal spray Place into the nose.    . Ginseng 100 MG CAPS Take 100 mg by mouth daily as needed (antiinflammatory).    . Magnesium 250 MG TABS Take 250 mg by mouth daily.    . meloxicam (MOBIC) 15 MG tablet Take 15 mg by mouth daily as needed for pain.     . Multiple Vitamin (MULTI-VITAMINS) TABS Take 1 tablet by mouth daily.     Marland Kitchen oxyCODONE (OXY IR/ROXICODONE) 5 MG immediate release tablet Take 1-2 tablets (5-10 mg total) by mouth every 4 (four) hours as needed for moderate pain. 60 tablet 0  . PSYLLIUM PO Take 3 capsules by mouth 2 (two) times daily.     . Saw Palmetto, Serenoa repens, (SAW PALMETTO PO) Take 3 capsules by mouth daily.     . tadalafil (CIALIS) 20 MG tablet 1 tab 1 hour prior to intercourse 5 tablet 0  . tamsulosin (FLOMAX) 0.4 MG CAPS capsule Take 1 capsule (0.4 mg total) by mouth daily. (Patient not taking: Reported on 05/16/2020) 30 capsule 0  . tiZANidine (ZANAFLEX) 4 MG tablet Take by mouth.    . traMADol (ULTRAM) 50 MG tablet Take 1 tablet (50 mg total) by mouth  every 6 (six) hours. 40 tablet 0  . traZODone (DESYREL) 50 MG tablet Take 25 mg by mouth at bedtime as needed for sleep.  Review of Systems Full ROS  was asked and was negative except for the information on the HPI  Physical Exam Blood pressure (!) 159/95, pulse 97, temperature 98.4 F (36.9 C), temperature source Oral, resp. rate 17, height 5\' 11"  (1.803 m), weight 88 kg, SpO2 97 %. CONSTITUTIONAL: NAD EYES: Pupils are equal, round, Sclera are non-icteric. EARS, NOSE, MOUTH AND THROAT: He is wearing a mask hearing is intact to voice. LYMPH NODES:  Lymph nodes in the neck are normal. RESPIRATORY:  Lungs are clear. There is normal respiratory effort, with equal breath sounds bilaterally, and without pathologic use of accessory muscles. CARDIOVASCULAR: Heart is regular without murmurs, gallops, or rubs. GI: The abdomen is  soft, minimally tender diffusely.  No peritonitis.  There is mildly distention.  There is decreased bowel s sounds. GU: Rectal deferred.   MUSCULOSKELETAL: Normal muscle strength and tone. No cyanosis or edema.   SKIN: Turgor is good and there are no pathologic skin lesions or ulcers. NEUROLOGIC: Motor and sensation is grossly normal. Cranial nerves are grossly intact. PSYCH:  Oriented to person, place and time. Affect is normal.  Data Reviewed  I have personally reviewed the patient's imaging, laboratory findings and medical records.    Assessment/Plan 69 year old male with prior umbilical hernia now comes in with small bowel obstruction.  Early the patient is nontoxic and is not peritoneal leak.  I do think that is worth a trial or conservative management to include NG tube IV fluid resuscitation and serial abdominal exams.  We will also replace his potassium. Understand that if he does not improve the next 48 to 72 hours he may require exploration.  No need for emergent surgical intervention at this time.  Caroleen Hamman, MD FACS General Surgeon 06/05/2020, 3:01  PM

## 2020-06-05 NOTE — ED Notes (Signed)
Transportation requested, ED tech to transport pt

## 2020-06-05 NOTE — ED Provider Notes (Signed)
[facility] Emergency Department Provider Note   ____________________________________________   First MD Initiated Contact with Patient 06/05/20 [phone]     (approximate)  I have reviewed the triage vital signs and the nursing notes.   HISTORY  Chief Complaint Abdominal Pain and Emesis    HPI Adrian Hart is a 69 y.o. male here for evaluation of abdominal pain  Patient reports a few days now of severe abdominal pain with vomiting.  Very little if any stool, passed a couple small hard pellets yesterday.  Not passing any gas  No black or bloody emesis.  No chest pain or trouble breathing.  No Covid exposure except for he did have exposure to a febrile infant that was later diagnosed with RSV recently  History of previous abdominal surgeries including umbilical hernia   Past Medical History:  Diagnosis Date  . Cancer [facility])    prostate.  needs no treatment  . GERD (gastroesophageal reflux disease)    PT TAKES APPLE CIDER VINEGAR   . Hypertension    borderline pressures.  takes no meds    Patient Active Problem List   Diagnosis Date Noted  . SBO (small bowel obstruction) ([place]) 06/05/2020  . Status post left partial knee replacement 09/01/2019  . Prostate cancer ([place]) 07/28/2019  . Coronary artery disease involving native coronary artery 09/04/2018  . Status post right partial knee replacement 09/05/2017  . Degenerative tear of medial meniscus of left knee 08/12/2017  . Elevated hemoglobin A1c 01/09/2017  . Post-traumatic osteoarthritis of left knee 12/10/2016  . Umbilical hernia without obstruction and without gangrene 09/14/2015  . Health care maintenance 09/07/2015  . Cataract 07/12/2015    Past Surgical History:  Procedure Laterality Date  . COLONOSCOPY  2003   Dr Alveta Heimlich  . COLONOSCOPY WITH PROPOFOL N/A 05/02/2018   Procedure: COLONOSCOPY WITH PROPOFOL;  Surgeon: Manya Silvas, MD;  Location: [facility] ENDOSCOPY;  Service: Endoscopy;   Laterality: N/A;  . EYE SURGERY Right    cataract  . FLEXIBLE SIGMOIDOSCOPY  1996   Dr Bary Castilla  . HEMORRHOID SURGERY  04-20-04   Dr Bary Castilla  . HERNIA REPAIR  95/28/4132   Umbilical, primary repair  . KNEE SURGERY Left 1995   arthroscopy  . PARTIAL KNEE ARTHROPLASTY Right 09/05/2017   Procedure: UNICOMPARTMENTAL KNEE;  Surgeon: Corky Mull, MD;  Location: [facility] ORS;  Service: Orthopedics;  Laterality: Right;  . PARTIAL KNEE ARTHROPLASTY Left 09/01/2019   Procedure: UNICOMPARTMENTAL KNEE;  Surgeon: Corky Mull, MD;  Location: [facility] ORS;  Service: Orthopedics;  Laterality: Left;  . TONSILLECTOMY    . UMBILICAL HERNIA REPAIR N/A 09/27/2015   Procedure: HERNIA REPAIR UMBILICAL ADULT;  Surgeon: Robert Bellow, MD;  Location: [facility] ORS;  Service: General;  Laterality: N/A;    Prior to Admission medications   Medication Sig Start Date End Date Taking? Authorizing Provider  fluticasone (FLONASE) 50 MCG/ACT nasal spray Place into the nose. 01/07/20 01/06/21 Yes [provider]  B Complex-Biotin-FA (SUPER B-COMPLEX) CAPS Take 1 capsule by mouth daily. Takes half a caplet a day    [provider]  Flaxseed, Linseed, (FLAXSEED OIL) 1200 MG CAPS Take 1,200 mg by mouth 2 (two) times daily.    [provider]  Ginseng 100 MG CAPS Take 100 mg by mouth daily as needed (antiinflammatory).    [provider]  Magnesium 250 MG TABS Take 250 mg by mouth daily.    [provider]  meloxicam (MOBIC) 15 MG tablet  Take 15 mg by mouth daily as needed for pain.     [provider]  Multiple Vitamin (MULTI-VITAMINS) TABS Take 1 tablet by mouth daily.     [provider]  PSYLLIUM PO Take 3 capsules by mouth 2 (two) times daily.     [provider]  Saw Palmetto, Serenoa repens, (SAW PALMETTO PO) Take 3 capsules by mouth daily.     [provider]  tadalafil (CIALIS) 20 MG tablet 1 tab 1 hour prior to intercourse 05/17/20   Stoioff, Ronda Fairly, MD    Allergies Fish-derived products and Other  Family History  Problem Relation Age of Onset  . Diabetes Mother   . Bladder Cancer Neg Hx   . Kidney cancer Neg Hx   . Prostate cancer Neg Hx     Social History Social History   Tobacco Use  . Smoking status: Former Smoker    Packs/day: 0.75    Years: 40.00    Pack years: 30.00    Types: Cigarettes    Quit date: 2009    Years since quitting: 12.5  . Smokeless tobacco: Never Used  Vaping Use  . Vaping Use: Never used  Substance Use Topics  . Alcohol use: No    Alcohol/week: 0.0 standard drinks  . Drug use: No    Review of Systems Constitutional: No fever/chills Eyes: No visual changes. ENT: No sore throat. Cardiovascular: Denies chest pain. Respiratory: Denies shortness of breath. Gastrointestinal: See HPI Genitourinary: Negative for dysuria. Musculoskeletal: Negative for back pain. Skin: Negative for rash. Neurological: Negative for headaches.    ____________________________________________   PHYSICAL EXAM:  VITAL SIGNS: ED Triage Vitals  Enc Vitals Group     BP 06/05/20 0635 (!) 171/118     Pulse Rate 06/05/20 0635 (!) 117     Resp 06/05/20 0635 18     Temp 06/05/20 0635 98.4 F (36.9 C)     Temp Source 06/05/20 0635 Oral     SpO2 06/05/20 0635 97 %     Weight 06/05/20 0635 194 lb (88 kg)     Height 06/05/20 0638 5\' 11"  (1.803 m)     Head Circumference --      Peak Flow --      Pain Score 06/05/20 0638 7     Pain Loc --      Pain Edu? --      Excl. in [place]? --     Constitutional: Alert and oriented.  He is in pain, uncomfortable appearing and so his abdominal pain Eyes: Conjunctivae are normal. Head: Atraumatic. Nose: No congestion/rhinnorhea. Mouth/Throat: Mucous membranes are moist. Neck: No stridor.  Cardiovascular: Normal rate, regular rhythm. Grossly normal heart sounds.  Good peripheral circulation. Respiratory: Normal respiratory effort.  No retractions. Lungs CTAB.  Gastrointestinal: Soft and mildly distended.  Increased bowel sounds.  Significant tenderness especially palpation along the left mid abdomen Musculoskeletal: No lower extremity tenderness nor edema. Neurologic:  Normal speech and language. No gross focal neurologic deficits are appreciated.  Skin:  Skin is warm, dry and intact. No rash noted. Psychiatric: Mood and affect are normal. Speech and behavior are normal.  ____________________________________________   LABS (all labs ordered are listed, but only abnormal results are displayed)  Labs Reviewed  COMPREHENSIVE METABOLIC PANEL - Abnormal; Notable for the following components:      Result Value   Glucose, Bld 172 (*)    BUN 30 (*)    All other components within normal limits  CBC -  Abnormal; Notable for the following components:   WBC 21.6 (*)    RBC 6.07 (*)    Hemoglobin 17.9 (*)    All other components within normal limits  CBC - Abnormal; Notable for the following components:   WBC 17.9 (*)    All other components within normal limits  BASIC METABOLIC PANEL - Abnormal; Notable for the following components:   Potassium 3.3 (*)    Chloride 97 (*)    Glucose, Bld 124 (*)    BUN 27 (*)    All other components within normal limits  PHOSPHORUS - Abnormal; Notable for the following components:   Phosphorus 4.8 (*)    All other components within normal limits  SARS CORONAVIRUS 2 BY RT PCR (HOSPITAL ORDER, [place] [facility])  LIPASE, BLOOD  LACTIC ACID, PLASMA  MAGNESIUM  URINALYSIS, COMPLETE (UACMP) WITH MICROSCOPIC  HIV ANTIBODY (ROUTINE TESTING W REFLEX)   ____________________________________________  EKG  Reviewed interpreted 640 Heart rate 120 QRS 80 QTc 440 Sinus tachycardia, no evidence acute ischemia ____________________________________________  RADIOLOGY  CT ABDOMEN PELVIS W CONTRAST  Result Date: 06/05/2020 CLINICAL DATA:  69 year old male with history of abdominal pain, vomiting  and dizziness. EXAM: CT ABDOMEN AND PELVIS WITH CONTRAST TECHNIQUE: Multidetector CT imaging of the abdomen and pelvis was performed using the standard protocol following bolus administration of intravenous contrast. CONTRAST:  172mL OMNIPAQUE IOHEXOL 300 MG/ML  SOLN COMPARISON:  No priors. FINDINGS: Lower chest: Unremarkable. Hepatobiliary: No suspicious cystic or solid hepatic lesions. No intra or extrahepatic biliary ductal dilatation. Gallbladder is unremarkable in appearance. Pancreas: No pancreatic mass. No pancreatic ductal dilatation. No pancreatic or peripancreatic fluid collections or inflammatory changes. Spleen: Unremarkable. Adrenals/Urinary Tract: Subcentimeter low-attenuation lesions in the left kidney, too small to characterize, but statistically likely to represent tiny cysts. Right kidney is normal in appearance. No hydroureteronephrosis. Urinary bladder is normal in appearance. Adrenal form thickening of the adrenal glands bilaterally, likely reflective of adrenal hyperplasia. Stomach/Bowel: Stomach is moderately distended. Numerous dilated loops of small bowel with multiple air-fluid levels measuring up to 4.5 cm in diameter. Dilated small bowel extends into the mid jejunum where there is a transition point, beyond which the small bowel is otherwise completely decompressed. Fecalization of small bowel contents immediately before the transition point (axial image 68 of series 2). Colon is relatively decompressed. Numerous colonic diverticulae are noted, particularly in the sigmoid colon, without surrounding inflammatory changes to suggest an acute diverticulitis at this time. Normal appendix. Vascular/Lymphatic: Aortic atherosclerosis, without evidence of aneurysm or dissection in the abdominal or pelvic vasculature. No lymphadenopathy noted in the abdomen or pelvis. Reproductive: Prostate gland and seminal vesicles are unremarkable in appearance. Other: Trace volume of ascites.  No  pneumoperitoneum. Musculoskeletal: Chronic appearing compression fracture of L2 with 30% loss of anterior vertebral body height. There are no aggressive appearing lytic or blastic lesions noted in the visualized portions of the skeleton. IMPRESSION: 1. Small-bowel obstruction with transition point in the mid jejunum, as above. 2. Trace volume of ascites. 3. Colonic diverticulosis without evidence of acute diverticulitis at this time. 4. Adrenal hyperplasia bilaterally. 5. Aortic atherosclerosis. Electronically Signed   By: Vinnie Langton M.D.   On: 06/05/2020 09:48   DG Abd Portable 1 View  Result Date: 06/05/2020 CLINICAL DATA:  Nasogastric tube placement EXAM: PORTABLE ABDOMEN - 1 VIEW COMPARISON:  CT abdomen pelvis June 05, 2020 FINDINGS: Nasogastric tube is identified with distal tip in the proximal stomach. The site for is probably  in the distal esophagus. Advancement by 5 cm is recommended. Status small bowel loops are identified, seen on recent CT. IMPRESSION: Nasogastric tube is identified with distal tip in the proximal stomach. The site for is probably in the distal esophagus. Advancement by 5 cm is recommended. These results will be called to the ordering clinician or representative by the Radiologist Assistant, and communication documented in the PACS or [employer]. Electronically Signed   By: Abelardo Diesel M.D.   On: 06/05/2020 10:46    CT imaging reviewed, concern for small bowel obstruction. ____________________________________________   PROCEDURES  Procedure(s) performed: None  Procedures  Critical Care performed: No  ____________________________________________   INITIAL IMPRESSION / ASSESSMENT AND PLAN / ED COURSE  Pertinent labs & imaging results that were available during my care of the patient were reviewed by me and considered in my medical decision making (see chart for details).   Differential diagnosis includes but is not limited to, abdominal perforation,  aortic dissection, cholecystitis, appendicitis, diverticulitis, colitis, esophagitis/gastritis, kidney stone, pyelonephritis, urinary tract infection, aortic aneurysm. All are considered in decision and treatment plan. Based upon the patient's presentation and risk factors, seated with CT imaging.  High concern for possible obstruction based on his clinical history and exam  Burr Soffer was evaluated in Emergency Department on 06/05/2020 for the symptoms described in the history of present illness. He was evaluated in the context of the global COVID-19 pandemic, which necessitated consideration that the patient might be at risk for infection with the SARS-CoV-2 virus that causes COVID-19. Institutional protocols and algorithms that pertain to the evaluation of patients at risk for COVID-19 are in a state of rapid change based on information released by regulatory bodies including the CDC and federal and state organizations. These policies and algorithms were followed during the patient's care in the ED.  ----------------------------------------- 10:13 AM on 06/05/2020 -----------------------------------------  Patient pain improving.  Understanding of plan for consultation with general surgery.  Case has been consulted with Dr. Dahlia Byes who will see patient in ER shortly. NG tube placement pending.  Abd x-ray reviewed, and NG tube advanced  ----------------------------------------- 4:03 PM on 06/05/2020 -----------------------------------------  Patient admitted to general surgery, Dr. Dahlia Byes.      ____________________________________________   FINAL CLINICAL IMPRESSION(S) / ED DIAGNOSES  Final diagnoses:  SBO (small bowel obstruction) ([place])        Note:  This document was prepared using Dragon voice recognition software and may include unintentional dictation errors       Delman Kitten, MD 06/05/20 1604

## 2020-06-06 ENCOUNTER — Inpatient Hospital Stay: Payer: PPO

## 2020-06-06 LAB — BASIC METABOLIC PANEL
Anion gap: 11 (ref 5–15)
BUN: 32 mg/dL — ABNORMAL HIGH (ref 8–23)
CO2: 27 mmol/L (ref 22–32)
Calcium: 8.8 mg/dL — ABNORMAL LOW (ref 8.9–10.3)
Chloride: 101 mmol/L (ref 98–111)
Creatinine, Ser: 0.97 mg/dL (ref 0.61–1.24)
GFR calc Af Amer: >60 mL/min (ref >60)
GFR calc non Af Amer: >60 mL/min (ref >60)
Glucose, Bld: 131 mg/dL — ABNORMAL HIGH (ref 70–99)
Potassium: 3.5 mmol/L (ref 3.5–5.1)
Sodium: 139 mmol/L (ref 135–145)

## 2020-06-06 LAB — CBC
HCT: 44.4 % (ref 39.0–52.0)
Hemoglobin: 15.7 g/dL (ref 13.0–17.0)
MCH: 29.7 pg (ref 26.0–34.0)
MCHC: 35.4 g/dL (ref 30.0–36.0)
MCV: 84.1 fL (ref 80.0–100.0)
Platelets: 260 10*3/uL (ref 150–400)
RBC: 5.28 MIL/uL (ref 4.22–5.81)
RDW: 13.8 % (ref 11.5–15.5)
WBC: 11.4 10*3/uL — ABNORMAL HIGH (ref 4.0–10.5)
nRBC: 0 % (ref 0.0–0.2)

## 2020-06-06 LAB — HIV ANTIBODY (ROUTINE TESTING W REFLEX): HIV Screen 4th Generation wRfx: NONREACTIVE

## 2020-06-06 NOTE — Progress Notes (Signed)
This is a patient of Dr. Corlis Leak, seen today on rounds.  He was admitted yesterday with a partial small bowel obstruction.  A nasogastric tube was placed and it has put out 1.7 L.  He denies any flatus but feels rumbling in his abdomen.  He was feeling a little nauseated this morning.  His nurse checked his nasogastric tube and it was not functioning correctly.  It was fixed and another 800 cc came out while I was in the room seeing the patient.  Today's Vitals   06/06/20 0533 06/06/20 0603 06/06/20 0845 06/06/20 1149  BP:    132/77  Pulse:    84  Resp:    14  Temp:    98.8 F (37.1 C)  TempSrc:    Oral  SpO2:    93%  Weight:      Height:      PainSc: 6  Asleep 0-No pain    Body mass index is 27.06 kg/m. General: No acute distress Abdomen: Soft, mild diffuse tenderness.  Minimal distention.  Impression and plan: This is a 69 year old man with minimal surgical history who presented to the emergency department with a small bowel obstruction.  He began to feel better once the NG tube was functioning correctly.  I think we can continue conservative treatment for now, particularly as he is feeling some rumbling in his abdomen.  I have encouraged him to get up and walk or at least sit up in his chair at bedside.  He does understand that if he does not improve, he may require surgical intervention.

## 2020-06-06 NOTE — Progress Notes (Signed)
Initial Nutrition Assessment  DOCUMENTATION CODES:   Not applicable  INTERVENTION:   RD will monitor for diet advancement vs the need for nutrition support.   NUTRITION DIAGNOSIS:   Inadequate oral intake related to acute illness as evidenced by NPO status.  GOAL:   Patient will meet greater than or equal to 90% of their needs  MONITOR:   Diet advancement, Labs, Weight trends, Skin, I & O's  REASON FOR ASSESSMENT:   Malnutrition Screening Tool    ASSESSMENT:   69 y/o male with h/o prostate cancer, GERD and umbilical hernia s/p repair who is admitted with SBO  Pt with nausea, vomiting and abdominal pain for 3 days pta. Pt currently NPO. OGT in place with 1.7L/ output. Pt nauseas today. No flatus or BM. Conservative management for now per MD note. RD will monitor for diet advancement vs the need for nutrition support. Per chart, pt appears weight stable at baseline.   Medications reviewed and include: lovenox, protonix, NaCl w/ 5% dextrose @125ml /hr  Labs reviewed: BUN 32(H) Wbc- 11.4(H)  NUTRITION - FOCUSED PHYSICAL EXAM:    Most Recent Value  Orbital Region No depletion  Upper Arm Region No depletion  Thoracic and Lumbar Region No depletion  Buccal Region No depletion  Temple Region No depletion  Clavicle Bone Region No depletion  Clavicle and Acromion Bone Region No depletion  Scapular Bone Region No depletion  Dorsal Hand No depletion  Patellar Region No depletion  Anterior Thigh Region No depletion  Posterior Calf Region No depletion  Edema (RD Assessment) None  Hair Reviewed  Eyes Reviewed  Mouth Reviewed  Skin Reviewed  Nails Reviewed     Diet Order:   Diet Order            Diet NPO time specified Except for: Ice Chips  Diet effective now                EDUCATION NEEDS:   No education needs have been identified at this time  Skin:  Skin Assessment: Reviewed RN Assessment  Last BM:  7/3  Height:   Ht Readings from Last 1 Encounters:   06/05/20 5\' 11"  (1.803 m)    Weight:   Wt Readings from Last 1 Encounters:  06/05/20 88 kg    Ideal Body Weight:  78.18 kg  BMI:  Body mass index is 27.06 kg/m.  Estimated Nutritional Needs:   Kcal:  2000-2300kcal/day  Protein:  100-115g/day  Fluid:  >2L/day  Koleen Distance MS, RD, LDN Please refer to [facility] for RD and/or RD on-call/weekend/after hours pager

## 2020-06-07 ENCOUNTER — Inpatient Hospital Stay: Payer: PPO

## 2020-06-07 NOTE — Progress Notes (Signed)
Pt c/o of  Epigastric pain that radiates towards anterior and posterior left chest .

## 2020-06-07 NOTE — Progress Notes (Signed)
[facility] SURGICAL PROGRESS NOTE (cpt [phone])  Hospital Day(s): 2.   Interval History: Patient seen and examined, no acute events or new complaints overnight. Patient reports he is still having upper abdominal pain, mostly in his epigastric region, this radiates to her back and left shoulder, improved with morphine but returns, denies nausea or esmsis. No new labs this morning. NGT output is still significantly high at 3.1L. He has remained NPO. No flatus. He is mobilizing   Review of Systems:  Constitutional: denies fever, chills  HEENT: denies cough or congestion  Respiratory: denies any shortness of breath  Cardiovascular: denies chest pain or palpitations  Gastrointestinal: + abdominal pain, denied N/V, or diarrhea/and bowel function as per interval history Genitourinary: denies burning with urination or urinary frequency  Vital signs in last 24 hours: [min-max] current  Temp:  [98 F (36.7 C)-98.9 F (37.2 C)] 98 F (36.7 C) (07/06 0534) Pulse Rate:  [84-90] 88 (07/06 0534) Resp:  [14-20] 16 (07/06 0534) BP: (132-150)/(77-81) 134/79 (07/06 0534) SpO2:  [93 %-96 %] 93 % (07/06 0534)     Height: 5\' 11"  (180.3 cm) Weight: 88 kg BMI (Calculated): 27.07   Intake/Output last 2 shifts:  07/05 0701 - 07/06 0700 In: 2213.1 [I.V.:2213.1] Out: 3800 [Urine:650; Emesis/NG output:3150]    Physical Exam:  Constitutional: alert, cooperative and no distress  HENT: normocephalic without obvious abnormality, NGT in place Eyes: PERRL, EOM's grossly intact and symmetric  Respiratory: breathing non-labored at rest  Cardiovascular: regular rate and sinus rhythm  Gastrointestinal: Soft, tenderness to the epigastrium, umbilical regions, distended, tympanic, no rebound/guarding Musculoskeletal: No edema or wounds, motor and sensation grossly intact, NT    Labs:  CBC Latest Ref Rng & Units 06/06/2020 06/05/2020 06/05/2020  WBC 4.0 - 10.5 K/uL 11.4(H) 17.9(H) 21.6(H)  Hemoglobin 13.0  - 17.0 g/dL 15.7 16.9 17.9(H)  Hematocrit 39 - 52 % 44.4 47.6 51.3  Platelets 150 - 400 K/uL 260 271 346   CMP Latest Ref Rng & Units 06/06/2020 06/05/2020 06/05/2020  Glucose 70 - 99 mg/dL 131(H) 124(H) 172(H)  BUN 8 - 23 mg/dL 32(H) 27(H) 30(H)  Creatinine 0.61 - 1.24 mg/dL 0.97 0.87 0.94  Sodium 135 - 145 mmol/L 139 139 140  Potassium 3.5 - 5.1 mmol/L 3.5 3.3(L) 4.0  Chloride 98 - 111 mmol/L 101 97(L) 100  CO2 22 - 32 mmol/L 27 30 25   Calcium 8.9 - 10.3 mg/dL 8.8(L) 9.3 10.1  Total Protein 6.5 - 8.1 g/dL - - 8.1  Total Bilirubin 0.3 - 1.2 mg/dL - - 1.2  Alkaline Phos 38 - 126 U/L - - 75  AST 15 - 41 U/L - - 20  ALT 0 - 44 U/L - - 24     Imaging studies:   KUB + CXR (06/07/2020) personally reviewed with dilated loops of small bowel, air fluid levels present, no free air, no cardiopulmonary changes, and radiologist report reviewed:  IMPRESSION: A degree of bowel obstruction is felt to remain with dilated loops of small bowel and air-fluid levels. Nasogastric tube tip and side port in proximal stomach. No demonstrable free air. Lungs clear.   Assessment/Plan: (ICD-10's: K39.609) 69 y.o. male with clinically persistent small bowel obstruction   - We will continue NGT decompression today; I encouraged him to limit number of ice chips he is eating, I do believe this is impacting output  - Morning KUB for reassessment  - No emergent intervention, we will follow closely. If not improved tomorrow we will need  to consider exploratory laparotomy  - NPO + IVF resuscitation  - Pain control prn; antiemetics prn  - Monitor abdominal examination; on-going bowel function    - Morning labs  - I will get EKG as a precaution, I do not suspect ACS as etiology of his pain but will rule this out  - Continue PPI  - Medical management of comorbidities  - DVT prophylaxis   All of the above findings and recommendations were discussed with the patient, and the medical team, and all of patient's  questions were answered to his expressed satisfaction.  -- Edison Simon, PA-C [facility] 06/07/2020, 7:31 AM [phone] M-F: 7am - 4pm

## 2020-06-07 NOTE — Plan of Care (Signed)
Ambulated three times x 5 laps each.

## 2020-06-08 ENCOUNTER — Inpatient Hospital Stay: Payer: PPO

## 2020-06-08 LAB — BASIC METABOLIC PANEL
Anion gap: 7 (ref 5–15)
BUN: 25 mg/dL — ABNORMAL HIGH (ref 8–23)
CO2: 26 mmol/L (ref 22–32)
Calcium: 8.2 mg/dL — ABNORMAL LOW (ref 8.9–10.3)
Chloride: 111 mmol/L (ref 98–111)
Creatinine, Ser: 0.84 mg/dL (ref 0.61–1.24)
GFR calc Af Amer: >60 mL/min (ref >60)
GFR calc non Af Amer: >60 mL/min (ref >60)
Glucose, Bld: 94 mg/dL (ref 70–99)
Potassium: 3.3 mmol/L — ABNORMAL LOW (ref 3.5–5.1)
Sodium: 144 mmol/L (ref 135–145)

## 2020-06-08 LAB — CBC
HCT: 37.1 % — ABNORMAL LOW (ref 39.0–52.0)
Hemoglobin: 12.4 g/dL — ABNORMAL LOW (ref 13.0–17.0)
MCH: 29.7 pg (ref 26.0–34.0)
MCHC: 33.4 g/dL (ref 30.0–36.0)
MCV: 89 fL (ref 80.0–100.0)
Platelets: 178 10*3/uL (ref 150–400)
RBC: 4.17 MIL/uL — ABNORMAL LOW (ref 4.22–5.81)
RDW: 13.6 % (ref 11.5–15.5)
WBC: 5.6 10*3/uL (ref 4.0–10.5)
nRBC: 0 % (ref 0.0–0.2)

## 2020-06-08 LAB — MAGNESIUM: Magnesium: 2.1 mg/dL (ref 1.7–2.4)

## 2020-06-08 LAB — PHOSPHORUS: Phosphorus: 2.9 mg/dL (ref 2.5–4.6)

## 2020-06-08 MED ORDER — POTASSIUM CHLORIDE 10 MEQ/100ML IV SOLN
10.0000 meq | INTRAVENOUS | Status: AC
  Administered 2020-06-08 (×4): 10 meq via INTRAVENOUS
  Filled 2020-06-08 (×4): qty 100

## 2020-06-08 NOTE — Care Management Important Message (Signed)
Important Message  Patient Details  Name: Adrian Hart MRN: [id number] Date of Birth: [birth year]   Medicare Important Message Given:  Yes  Initial Medicare IM given by Patient Access Associate on 06/07/2020 at 10:26am.    Dannette Barbara 06/08/2020, 8:16 AM

## 2020-06-08 NOTE — Progress Notes (Signed)
[facility] SURGICAL PROGRESS NOTE (cpt [id number])  Hospital Day(s): 3.   Interval History: Patient seen and examined, no acute events or new complaints overnight. His biggest complaint is discomfort from NGT. He denies abdominal pain, distension, nausea, emesis, fever, or chills. Labs are unremarkable aside from mild hypokalemia at 3.3. NGT output has slowed significantly and is only 170 ccs now in last 24 hours. He is passing persistent flatus. Mobilizing without issue  Review of Systems:  Constitutional: denies fever, chills  HEENT: denies cough or congestion, + sore throat Respiratory: denies any shortness of breath  Cardiovascular: denies chest pain or palpitations  Gastrointestinal: denies abdominal pain, N/V, or diarrhea/and bowel function as per interval history Genitourinary: denies burning with urination or urinary frequency Musculoskeletal: denies pain, decreased motor or sensation  Vital signs in last 24 hours: [min-max] current  Temp:  [98.1 F (36.7 C)-98.5 F (36.9 C)] 98.5 F (36.9 C) (07/07 0509) Pulse Rate:  [73-86] 73 (07/07 0509) Resp:  [16-20] 20 (07/07 0509) BP: (134-139)/(79-86) 134/86 (07/07 0509) SpO2:  [97 %-98 %] 98 % (07/07 0509)     Height: 5\' 11"  (180.3 cm) Weight: 88 kg BMI (Calculated): 27.07   Intake/Output last 2 shifts:  07/06 0701 - 07/07 0700 In: 3356.5 [I.V.:3356.5] Out: 2 [Urine:800; Emesis/NG output:170]   Physical Exam:  Constitutional: alert, cooperative and no distress  HENT: normocephalic without obvious abnormality, NGT in place Eyes: PERRL, EOM's grossly intact and symmetric  Respiratory: breathing non-labored at rest  Cardiovascular: regular rate and sinus rhythm  Gastrointestinal: Soft, non-tender, non-distended, no rebound/guarding Musculoskeletal: No edema or wounds, motor and sensation grossly intact, NT    Labs:  CBC Latest Ref Rng & Units 06/08/2020 06/06/2020 06/05/2020  WBC 4.0 - 10.5 K/uL 5.6 11.4(H) 17.9(H)   Hemoglobin 13.0 - 17.0 g/dL 12.4(L) 15.7 16.9  Hematocrit 39 - 52 % 37.1(L) 44.4 47.6  Platelets 150 - 400 K/uL 178 260 271   CMP Latest Ref Rng & Units 06/08/2020 06/06/2020 06/05/2020  Glucose 70 - 99 mg/dL 94 131(H) 124(H)  BUN 8 - 23 mg/dL 25(H) 32(H) 27(H)  Creatinine 0.61 - 1.24 mg/dL 0.84 0.97 0.87  Sodium 135 - 145 mmol/L 144 139 139  Potassium 3.5 - 5.1 mmol/L 3.3(L) 3.5 3.3(L)  Chloride 98 - 111 mmol/L 111 101 97(L)  CO2 22 - 32 mmol/L 26 27 30   Calcium 8.9 - 10.3 mg/dL 8.2(L) 8.8(L) 9.3  Total Protein 6.5 - 8.1 g/dL - - -  Total Bilirubin 0.3 - 1.2 mg/dL - - -  Alkaline Phos 38 - 126 U/L - - -  AST 15 - 41 U/L - - -  ALT 0 - 44 U/L - - -    Imaging studies:   KUB (06/08/2020) personally reviewed showing a few loops of dilated bowel but there is gas in the colon now, and radiologist report reviewed:  IMPRESSION: 1. Gradual improvement in the partial small bowel obstruction since the 06/05/2020 CT. No new abnormalities. 2. Nasogastric tube tip projects over the proximal stomach.   Assessment/Plan: (ICD-10's: K33.609) 69 y.o. male with clinically improving/resolving small bowel obstruction   - I will preform clamping trial this morning, check residual at 1200, if less than 150 ccs and continues passing flatus we will remove NGT    - IVF resuscitation             - Pain control prn; antiemetics prn             - Monitor abdominal  examination; on-going bowel function               - Morning labs; replete K+    - No emergent intervention  - Continue PPI             - Medical management of comorbidities             - DVT prophylaxis    All of the above findings and recommendations were discussed with the patient, and the medical team, and all of patient's questions were answered to his expressed satisfaction.  -- Edison Simon, PA-C [facility] 06/08/2020, 7:35 AM [phone] M-F: 7am - 4pm

## 2020-06-09 LAB — BASIC METABOLIC PANEL
Anion gap: 4 — ABNORMAL LOW (ref 5–15)
BUN: 12 mg/dL (ref 8–23)
CO2: 25 mmol/L (ref 22–32)
Calcium: 8.1 mg/dL — ABNORMAL LOW (ref 8.9–10.3)
Chloride: 112 mmol/L — ABNORMAL HIGH (ref 98–111)
Creatinine, Ser: 0.66 mg/dL (ref 0.61–1.24)
GFR calc Af Amer: >60 mL/min (ref >60)
GFR calc non Af Amer: >60 mL/min (ref >60)
Glucose, Bld: 103 mg/dL — ABNORMAL HIGH (ref 70–99)
Potassium: 3.5 mmol/L (ref 3.5–5.1)
Sodium: 141 mmol/L (ref 135–145)

## 2020-06-09 NOTE — Discharge Summary (Signed)
[facility] SURGICAL DISCHARGE SUMMARY (cpt: [id number])  Patient ID: Adrian Hart MRN: [id number] DOB/AGE: [birth year] 68 y.o.  Admit date: 06/05/2020 Discharge date: 06/09/2020  Discharge Diagnoses Patient Active Problem List   Diagnosis Date Noted  . SBO (small bowel obstruction) ([place]) 06/05/2020  . Umbilical hernia without obstruction and without gangrene 09/14/2015    Consultants None  Procedures None  HPI: Adrian Hart is a 69 y.o. male same to the emergency room complaining of abdominal pain nausea and vomiting.  He reports that the pain is colicky type moderate intensity and diffuse.  No specific alleviating or aggravating factors.  Reports that for the last 3 days he has been having some symptoms and he also feels dizzy.  Of note he does have a significant surgical history consistent of a primary open umbilical hernia repair by Dr. Bary Castilla 5 years ago.  No flatus or bowel movements today last bowel movement was yesterday a and was very small.  He did have a CT scan of the abdomen pelvis that I have personally reviewed showing evidence of dilated small bowel with decompressed terminal ileum.  There is no evidence of free air there is no evidence of pneumatosis or internal hernia.  He does have a white count of 17.9 and he is hemoconcentrated.  With a hemoglobin of 6.9.  He does have BUN of 27 and mild hypokalemia of 3.3.  Hospital Course: Patient was admitted to general surgery service and underwent conservative management of his SBO with NGT decompression. He initially had significant NGT output up to 3L daily. On HD3, his output ceased and he had return of bowel function without pain, distension, nausea, or emesis. He passed a clamping trial and NGT was removed. Advancement of patient's diet and ambulation were well-tolerated. The remainder of patient's hospital course was essentially unremarkable, and discharge planning was initiated accordingly with patient safely able to  be discharged home with appropriate discharge instructions, pain control, and outpatient follow-up after all of his questions were answered to his expressed satisfaction.   Discharge Condition: Good   Physical Examination:  Constitutional: alert, cooperative and no distress  HENT: normocephalic without obvious abnormality Eyes: PERRL, EOM's grossly intact and symmetric  Respiratory: breathing non-labored at rest  Cardiovascular: regular rate and sinus rhythm  Gastrointestinal:Soft, non-tender, non-distended, no rebound/guarding Musculoskeletal:No edema or wounds, motor and sensation grossly intact, NT   Allergies as of 06/09/2020      Reactions   Fish-derived Products Nausea And Vomiting   Reaction to mussells only   Other Nausea And Vomiting      Medication List    TAKE these medications   Flaxseed Oil 1200 MG Caps Take 1,200 mg by mouth 2 (two) times daily.   fluticasone 50 MCG/ACT nasal spray Commonly known as: FLONASE Place into the nose.   Ginseng 100 MG Caps Take 100 mg by mouth daily as needed (antiinflammatory).   Magnesium 250 MG Tabs Take 250 mg by mouth daily.   meloxicam 15 MG tablet Commonly known as: MOBIC Take 15 mg by mouth daily as needed for pain.   Multi-Vitamins Tabs Take 1 tablet by mouth daily.   PSYLLIUM PO Take 3 capsules by mouth 2 (two) times daily.   SAW PALMETTO PO Take 3 capsules by mouth daily.   Super B-Complex Caps Take 1 capsule by mouth daily. Takes half a caplet a day   tadalafil 20 MG tablet Commonly known as: CIALIS 1 tab 1 hour prior to intercourse  Follow-up Information    Pabon, [place] F, MD. Schedule an appointment as soon as possible for a visit in 3 week(s).   Specialty: General Surgery Why: 2-3 week hospital follow up for small bowel obstruction  Contact information: [address] [place] [address] [phone]                Time spent on discharge management  including discussion of hospital course, clinical condition, outpatient instructions, prescriptions, and follow up with the patient and members of the medical team: >30 minutes  -- Edison Simon , PA-C [facility]  06/09/2020, 8:11 AM [id number] M-F: 7am - 4pm

## 2020-06-13 ENCOUNTER — Other Ambulatory Visit: Payer: Self-pay | Admitting: Urology

## 2020-06-22 ENCOUNTER — Encounter: Payer: Self-pay | Admitting: Surgery

## 2020-06-22 ENCOUNTER — Other Ambulatory Visit: Payer: Self-pay

## 2020-06-22 ENCOUNTER — Ambulatory Visit (INDEPENDENT_AMBULATORY_CARE_PROVIDER_SITE_OTHER): Payer: PPO | Admitting: Surgery

## 2020-06-22 DIAGNOSIS — K56609 Unspecified intestinal obstruction, unspecified as to partial versus complete obstruction: Secondary | ICD-10-CM

## 2020-06-22 NOTE — Patient Instructions (Signed)
Patient is scheduled for CT Enterography on Friday July 30th with arrival time 2:00pm at:  [facility]  [address] [place],  [place]  [address] [phone]  Patient instructions nothing to eat or drink four hours prior to scan.    Bowel Obstruction A bowel obstruction is a blockage in the small or large bowel. The bowel, which is also called the intestine, is a long, slender tube that connects the stomach to the anus. When a person eats and drinks, food and fluids go from the mouth to the stomach to the small bowel. This is where most of the nutrients in the food and fluids are absorbed. After the small bowel, material passes through the large bowel for further absorption until any leftover material leaves the body as stool through the anus during a bowel movement. A bowel obstruction will prevent food and fluids from passing through the bowel as they normally do during digestion. The bowel can become partially or completely blocked. If this condition is not treated, it can be dangerous because the bowel could rupture. What are the causes? Common causes of this condition include:  Scar tissue (adhesions) from previous surgery or treatment with high-energy X-rays (radiation).  Recent surgery. This may cause the movements of the bowel to slow down and cause food to block the intestine.  Inflammatory bowel disease, such as Crohn's disease or diverticulitis.  Growths or tumors.  A bulging organ (hernia).  Twisting of the bowel (volvulus).  A foreign body.  Slipping of a part of the bowel into another part (intussusception). What are the signs or symptoms? Symptoms of this condition include:  Pain in the abdomen. Depending on the degree of obstruction, pain may be: ? Mild or severe. ? Dull cramping or sharp pain. ? In one area or in the entire abdomen.  Nausea and vomiting. Vomit may be greenish or a yellow bile color.  Bloating in the  abdomen.  Difficulty passing stool (constipation).  Lack of passing gas.  Frequent belching.  Diarrhea. This may occur if the obstruction is partial and runny stool is able to leak around the obstruction. How is this diagnosed? This condition may be diagnosed based on:  A physical exam.  Medical history.  Imaging tests of the abdomen or pelvis, such as X-ray or CT scan.  Blood or urine tests. How is this treated? Treatment for this condition depends on the cause and severity of the problem. Treatment may include:  Fluids and pain medicines that are given through an IV. Your health care provider may instruct you not to eat or drink if you have nausea or vomiting.  Eating a simple diet. You may be asked to consume a clear liquid diet for several days. This allows the bowel to rest.  Placement of a small tube (nasogastric tube) into the stomach. This will relieve pain, discomfort, and nausea by removing blocked air and fluids from the stomach. It can also help the obstruction clear up faster.  Surgery. This may be required if other treatments do not work. Surgery may be required for: ? Bowel obstruction from a hernia. This can be an emergency procedure. ? Scar tissue that causes frequent or severe obstructions. Follow these instructions at home: Medicines  Take over-the-counter and prescription medicines only as told by your health care provider.  If you were prescribed an antibiotic medicine, take it as told by your health care provider. Do not stop taking the antibiotic even if you start to  feel better. General instructions  Follow instructions from your health care provider about eating restrictions. You may need to avoid solid foods and consume only clear liquids until your condition improves.  Return to your normal activities as told by your health care provider. Ask your health care provider what activities are safe for you.  Avoid sitting for a long time without moving.  Get up to take short walks every 1-2 hours. This is important to improve blood flow and breathing. Ask for help if you feel weak or unsteady.  Keep all follow-up visits as told by your health care provider. This is important. How is this prevented? After having a bowel obstruction, you are more likely to have another. You may do the following things to prevent another obstruction:  If you have a long-term (chronic) disease, pay attention to your symptoms and contact your health care provider if you have questions or concerns.  Avoid becoming constipated. To prevent or treat constipation, your health care provider may recommend that you: ? Drink enough fluid to keep your urine pale yellow. ? Take over-the-counter or prescription medicines. ? Eat foods that are high in fiber, such as beans, whole grains, and fresh fruits and vegetables. ? Limit foods that are high in fat and processed sugars, such as fried or sweet foods.  Stay active. Exercise for 30 minutes or more, 5 or more days each week. Ask your health care provider which exercises are safe for you.  Avoid stress. Find ways to reduce stress, such as meditation, exercise, or taking time for activities that relax you.  Instead of eating three large meals each day, eat three small meals with three small snacks.  Work with a Microbiologist to make a healthy meal plan that works for you.  Do not use any products that contain nicotine or tobacco, such as cigarettes and e-cigarettes. If you need help quitting, ask your health care provider. Contact a health care provider if you:  Have a fever.  Have chills. Get help right away if you:  Have increased pain or cramping.  Vomit blood.  Have uncontrolled vomiting or nausea.  Cannot drink fluids because of vomiting or pain.  Become confused.  Begin feeling very thirsty (dehydrated).  Have severe bloating.  Feel extremely weak or you faint. Summary  A bowel obstruction is a blockage  in the small or large bowel.  A bowel obstruction will prevent food and fluids from passing through the bowel as they normally do during digestion.  Treatment for this condition depends on the cause and severity of the problem. It may include fluids and pain medicines through an IV, a simple diet, a nasogastric tube, or surgery.  Follow instructions from your health care provider about eating restrictions. You may need to avoid solid foods and consume only clear liquids until your condition improves. This information is not intended to replace advice given to you by your health care provider. Make sure you discuss any questions you have with your health care provider. Document Revised: 12/26/2018 Document Reviewed: 04/02/2018 Elsevier Patient Education  [place].

## 2020-06-23 NOTE — Progress Notes (Signed)
Adrian Hart Is following up due to SBO that was treated medically. HE is doing well, taking Po, no fevers, + BMs and flatus. He did have a hx of UH repair in the past and has a LIH. CT pers. Reviewed showing SBO,  He did have another episode of pain last week that lasted 8 hrs or so colicky type no vomiting. Resolved on its own.   PE NAD Abd: soft, nt, + bs, no peritonitis. LIH reducible  A/p SBO relatively virgin abdomen I do think he needs further w/u given that there is no true explanation for SBO. He had UH repair but transition point was away from this repair.  CT enterograpgy order. IH will have to be repair at some point in time as well. I spent 25 minutes in this encounter w > 50% spent in coordination and counseling.

## 2020-07-01 ENCOUNTER — Other Ambulatory Visit: Payer: Self-pay

## 2020-07-01 ENCOUNTER — Ambulatory Visit
Admission: RE | Admit: 2020-07-01 | Discharge: 2020-07-01 | Disposition: A | Discharge status: Home / Self Care | Payer: PPO | Source: Ambulatory Visit | Attending: Surgery | Admitting: Surgery

## 2020-07-01 DIAGNOSIS — K56609 Unspecified intestinal obstruction, unspecified as to partial versus complete obstruction: Secondary | ICD-10-CM | POA: Insufficient documentation

## 2020-07-01 DIAGNOSIS — K5730 Diverticulosis of large intestine without perforation or abscess without bleeding: Secondary | ICD-10-CM | POA: Diagnosis not present

## 2020-07-01 DIAGNOSIS — K6389 Other specified diseases of intestine: Secondary | ICD-10-CM | POA: Diagnosis not present

## 2020-07-01 DIAGNOSIS — K4090 Unilateral inguinal hernia, without obstruction or gangrene, not specified as recurrent: Secondary | ICD-10-CM | POA: Diagnosis not present

## 2020-07-01 DIAGNOSIS — I700 Atherosclerosis of aorta: Secondary | ICD-10-CM | POA: Diagnosis not present

## 2020-07-01 HISTORY — DX: Malignant neoplasm of prostate: C61

## 2020-07-01 MED ORDER — IOHEXOL 300 MG/ML  SOLN
75.0000 mL | Freq: Once | INTRAMUSCULAR | Status: AC | PRN
  Administered 2020-07-01: 75 mL via INTRAVENOUS

## 2020-07-04 ENCOUNTER — Telehealth: Payer: Self-pay

## 2020-07-04 NOTE — Telephone Encounter (Signed)
Patient notified of Ct results, reminded to keep appointment with Dr.Pabon this week.

## 2020-07-06 ENCOUNTER — Ambulatory Visit: Payer: PPO | Admitting: Surgery

## 2020-07-06 ENCOUNTER — Encounter: Payer: Self-pay | Admitting: Surgery

## 2020-07-06 ENCOUNTER — Other Ambulatory Visit: Payer: Self-pay

## 2020-07-06 VITALS — BP 125/80 | HR 84 | Temp 98.3°F | Resp 12 | Ht 71.0 in | Wt 192.0 lb

## 2020-07-06 DIAGNOSIS — K4090 Unilateral inguinal hernia, without obstruction or gangrene, not specified as recurrent: Secondary | ICD-10-CM | POA: Diagnosis not present

## 2020-07-06 DIAGNOSIS — K56609 Unspecified intestinal obstruction, unspecified as to partial versus complete obstruction: Secondary | ICD-10-CM | POA: Diagnosis not present

## 2020-07-06 NOTE — Patient Instructions (Addendum)
Our surgery scheduler will contact you to schedule your surgery. Please have the blue sheet available when she calls you.   We will send a Medical Clearance form to your primary care provider to make sure surgery is okay to proceed.   Call the office if you have any questions or concerns.   Inguinal Hernia, Adult An inguinal hernia develops when fat or the intestines push through a weak spot in a muscle where your leg meets your lower abdomen (groin). This creates a bulge. This kind of hernia could also be:  In your scrotum, if you are male.  In folds of skin around your vagina, if you are male. There are three types of inguinal hernias:  Hernias that can be pushed back into the abdomen (are reducible). This type rarely causes pain.  Hernias that are not reducible (are incarcerated).  Hernias that are not reducible and lose their blood supply (are strangulated). This type of hernia requires emergency surgery. What are the causes? This condition is caused by having a weak spot in the muscles or tissues in the groin. This weak spot develops over time. The hernia may poke through the weak spot when you suddenly strain your lower abdominal muscles, such as when you:  Lift a heavy object.  Strain to have a bowel movement. Constipation can lead to straining.  Cough. What increases the risk? This condition is more likely to develop in:  Men.  Pregnant women.  People who: ? Are overweight. ? Work in jobs that require long periods of standing or heavy lifting. ? Have had an inguinal hernia before. ? Smoke or have lung disease. These factors can lead to long-lasting (chronic) coughing. What are the signs or symptoms? Symptoms may depend on the size of the hernia. Often, a small inguinal hernia has no symptoms. Symptoms of a larger hernia may include:  A lump in the groin area. This is easier to see when standing. It might not be visible when lying down.  Pain or burning in the  groin. This may get worse when lifting, straining, or coughing.  A dull ache or a feeling of pressure in the groin.  In men, an unusual lump in the scrotum. Symptoms of a strangulated inguinal hernia may include:  A bulge in your groin that is very painful and tender to the touch.  A bulge that turns red or purple.  Fever, nausea, and vomiting.  Inability to have a bowel movement or to pass gas. How is this diagnosed? This condition is diagnosed based on your symptoms, your medical history, and a physical exam. Your health care provider may feel your groin area and ask you to cough. How is this treated? Treatment depends on the size of your hernia and whether you have symptoms. If you do not have symptoms, your health care provider may have you watch your hernia carefully and have you come in for follow-up visits. If your hernia is large or if you have symptoms, you may need surgery to repair the hernia. Follow these instructions at home: Lifestyle  Avoid lifting heavy objects.  Avoid standing for long periods of time.  Do not use any products that contain nicotine or tobacco, such as cigarettes and e-cigarettes. If you need help quitting, ask your health care provider.  Maintain a healthy weight. Preventing constipation  Take actions to prevent constipation. Constipation leads to straining with bowel movements, which can make a hernia worse or cause a hernia repair to break down. Your  health care provider may recommend that you: ? Drink enough fluid to keep your urine pale yellow. ? Eat foods that are high in fiber, such as fresh fruits and vegetables, whole grains, and beans. ? Limit foods that are high in fat and processed sugars, such as fried or sweet foods. ? Take an over-the-counter or prescription medicine for constipation. General instructions  You may try to push the hernia back in place by very gently pressing on it while lying down. Do not try to force the bulge back  in if it will not push in easily.  Watch your hernia for any changes in shape, size, or color. Get help right away if you notice any changes.  Take over-the-counter and prescription medicines only as told by your health care provider.  Keep all follow-up visits as told by your health care provider. This is important. Contact a health care provider if:  You have a fever.  You develop new symptoms.  Your symptoms get worse. Get help right away if:  You have pain in your groin that suddenly gets worse.  You have a bulge in your groin that: ? Suddenly gets bigger and does not get smaller. ? Becomes red or purple or painful to the touch.  You are a man and you have a sudden pain in your scrotum, or the size of your scrotum suddenly changes.  You cannot push the hernia back in place by very gently pressing on it when you are lying down. Do not try to force the bulge back in if it will not push in easily.  You have nausea or vomiting that does not go away.  You have a fast heartbeat.  You cannot have a bowel movement or pass gas. These symptoms may represent a serious problem that is an emergency. Do not wait to see if the symptoms will go away. Get medical help right away. Call your local emergency services (911 in the [place] Summary  An inguinal hernia develops when fat or the intestines push through a weak spot in a muscle where your leg meets your lower abdomen (groin).  This condition is caused by having a weak spot in muscles or tissue in your groin.  Symptoms may depend on the size of the hernia, and they may include pain or swelling in your groin. A small inguinal hernia often has no symptoms.  Treatment may not be needed if you do not have symptoms. If you have symptoms or a large hernia, you may need surgery to repair the hernia.  Avoid lifting heavy objects. Also avoid standing for long amounts of time. This information is not intended to replace advice given to you by  your health care provider. Make sure you discuss any questions you have with your health care provider. Document Revised: 12/21/2017 Document Reviewed: 08/21/2017 Elsevier Patient Education  [place].

## 2020-07-07 ENCOUNTER — Encounter: Payer: Self-pay | Admitting: Surgery

## 2020-07-07 ENCOUNTER — Telehealth: Payer: Self-pay | Admitting: Emergency Medicine

## 2020-07-07 ENCOUNTER — Telehealth: Payer: Self-pay | Admitting: Surgery

## 2020-07-07 NOTE — H&P (View-Only) (Signed)
Outpatient Surgical Follow Up  07/07/2020  Cassie Henkels is an 69 y.o. male.   Chief Complaint  Patient presents with  . Follow-up    Discuss CT results    HPI: Currie is a 69 year old following up after a bout of bowel obstruction.  He only had a prior umbilical hernia air.  He does have symptomatic left inguinal hernia.  I did send him for a CT enterography and there is no evidence of any bowel issues that are evident, left inguinal hernia..  He has some intermittent discomfort in the left groin.  He also reports some comfort in the right inguinal area.  Is able to perform more than 4 METS of activity without any shortness of breath or chest pain.  Past Medical History:  Diagnosis Date  . GERD (gastroesophageal reflux disease)    PT TAKES APPLE CIDER VINEGAR   . Hypertension    borderline pressures.  takes no meds  . Prostate cancer [facility])     needs no treatment    Past Surgical History:  Procedure Laterality Date  . COLONOSCOPY  2003   Dr Alveta Heimlich  . COLONOSCOPY WITH PROPOFOL N/A 05/02/2018   Procedure: COLONOSCOPY WITH PROPOFOL;  Surgeon: Manya Silvas, MD;  Location: [facility] ENDOSCOPY;  Service: Endoscopy;  Laterality: N/A;  . EYE SURGERY Right    cataract  . FLEXIBLE SIGMOIDOSCOPY  1996   Dr Bary Castilla  . HEMORRHOID SURGERY  04-20-04   Dr Bary Castilla  . HERNIA REPAIR  [id number]/[id number]   Umbilical, primary repair  . KNEE SURGERY Left 1995   arthroscopy  . PARTIAL KNEE ARTHROPLASTY Right 09/05/2017   Procedure: UNICOMPARTMENTAL KNEE;  Surgeon: Corky Mull, MD;  Location: [facility] ORS;  Service: Orthopedics;  Laterality: Right;  . PARTIAL KNEE ARTHROPLASTY Left 09/01/2019   Procedure: UNICOMPARTMENTAL KNEE;  Surgeon: Corky Mull, MD;  Location: [facility] ORS;  Service: Orthopedics;  Laterality: Left;  . TONSILLECTOMY    . UMBILICAL HERNIA REPAIR N/A 09/27/2015   Procedure: HERNIA REPAIR UMBILICAL ADULT;  Surgeon: Robert Bellow, MD;  Location: [facility] ORS;  Service: General;  Laterality: N/A;     Family History  Problem Relation Age of Onset  . Diabetes Mother   . Colon cancer Maternal Aunt   . Prostate cancer Maternal Uncle   . Bladder Cancer Neg Hx   . Kidney cancer Neg Hx     Social History:  reports that he quit smoking about 12 years ago. His smoking use included cigarettes. He has a 30.00 pack-year smoking history. He has never used smokeless tobacco. He reports that he does not drink alcohol and does not use drugs.  Allergies:  Allergies  Allergen Reactions  . Fish-Derived Products Nausea And Vomiting    Reaction to mussells only    Medications reviewed.    ROS Full ROS performed and is otherwise negative other than what is stated in HPI   BP 125/80   Pulse 84   Temp 98.3 F (36.8 C) (Oral)   Resp 12   Ht 5\' 11"  (1.803 m)   Wt 192 lb (87.1 kg)   SpO2 97%   BMI 26.78 kg/m   Physical Exam Vitals and nursing note reviewed. Exam conducted with a chaperone present.  Constitutional:      General: He is not in acute distress.    Appearance: Normal appearance.  Eyes:     General: No scleral icterus.       Right eye: No discharge.  Left eye: No discharge.  Cardiovascular:     Rate and Rhythm: Normal rate and regular rhythm.     Heart sounds: No murmur heard.   Pulmonary:     Effort: Pulmonary effort is normal. No respiratory distress.     Breath sounds: Normal breath sounds. No stridor.  Abdominal:     General: Abdomen is flat. There is no distension.     Palpations: Abdomen is soft. There is no mass.     Tenderness: There is no abdominal tenderness. There is no rebound.     Hernia: A hernia is present.     Comments: Left inguinal hernia reducible mildly tender.  Questionable inguinal hernia in the right side but I cannot definitively tell a physical exam.  Musculoskeletal:        General: Normal range of motion.     Cervical back: Normal range of motion and neck supple. No rigidity or tenderness.  Skin:    General: Skin is warm and  dry.     Capillary Refill: Capillary refill takes less than 2 seconds.  Neurological:     General: No focal deficit present.     Mental Status: He is alert and oriented to person, place, and time.  Psychiatric:        Mood and Affect: Mood normal.        Behavior: Behavior normal.        Thought Content: Thought content normal.        Judgment: Judgment normal.     Assessment/Plan: 69 year old male with resolved small bowel obstruction now with left inguinal hernia.  Discussed with the patient in detail I do think that he will be a good candidate for robotic approach.  He also understands that in the case that we find bilateral disease we will be fixing both.  Currently on exam is questionable whether or not he has a defect of the right side.  Procedure discussed with the patient in detail.  Risks, benefits and possible complications including but not limited to: Bleeding, infection, chronic pain, nerve injury and recurrence.  He understands and wishes to proceed Greater than 50% of the 40 minutes  visit was spent in counseling/coordination of care   Caroleen Hamman, MD [place] Surgeon

## 2020-07-07 NOTE — Telephone Encounter (Signed)
Pt has been advised of Pre-Admission date/time, COVID Testing date and Surgery date.  Surgery Date: 07/28/20 Preadmission Testing Date: 07/18/20 (phone 1p-5p) Covid Testing Date: 07/26/20 - patient advised to go to the [facility] ([facility] between 8a-1p  Patient has been made aware to call [phone], between 1-3:00pm the day before surgery, to find out what time to arrive for surgery.

## 2020-07-07 NOTE — Telephone Encounter (Signed)
Medical Clearance sent to Dr Ouida Sills office.   Z:[phone] T:[phone]

## 2020-07-07 NOTE — Progress Notes (Signed)
Outpatient Surgical Follow Up  07/07/2020  Adrian Hart is an 69 y.o. male.   Chief Complaint  Patient presents with  . Follow-up    Discuss CT results    HPI: Adrian Hart is a 70 year old following up after a bout of bowel obstruction.  He only had a prior umbilical hernia air.  He does have symptomatic left inguinal hernia.  I did send him for a CT enterography and there is no evidence of any bowel issues that are evident, left inguinal hernia..  He has some intermittent discomfort in the left groin.  He also reports some comfort in the right inguinal area.  Is able to perform more than 4 METS of activity without any shortness of breath or chest pain.  Past Medical History:  Diagnosis Date  . GERD (gastroesophageal reflux disease)    PT TAKES APPLE CIDER VINEGAR   . Hypertension    borderline pressures.  takes no meds  . Prostate cancer [facility])     needs no treatment    Past Surgical History:  Procedure Laterality Date  . COLONOSCOPY  2003   Dr Alveta Heimlich  . COLONOSCOPY WITH PROPOFOL N/A 05/02/2018   Procedure: COLONOSCOPY WITH PROPOFOL;  Surgeon: Manya Silvas, MD;  Location: [facility] ENDOSCOPY;  Service: Endoscopy;  Laterality: N/A;  . EYE SURGERY Right    cataract  . FLEXIBLE SIGMOIDOSCOPY  1996   Dr Bary Castilla  . HEMORRHOID SURGERY  04-20-04   Dr Bary Castilla  . HERNIA REPAIR  84/16/6063   Umbilical, primary repair  . KNEE SURGERY Left 1995   arthroscopy  . PARTIAL KNEE ARTHROPLASTY Right 09/05/2017   Procedure: UNICOMPARTMENTAL KNEE;  Surgeon: Corky Mull, MD;  Location: [facility] ORS;  Service: Orthopedics;  Laterality: Right;  . PARTIAL KNEE ARTHROPLASTY Left 09/01/2019   Procedure: UNICOMPARTMENTAL KNEE;  Surgeon: Corky Mull, MD;  Location: [facility] ORS;  Service: Orthopedics;  Laterality: Left;  . TONSILLECTOMY    . UMBILICAL HERNIA REPAIR N/A 09/27/2015   Procedure: HERNIA REPAIR UMBILICAL ADULT;  Surgeon: Robert Bellow, MD;  Location: [facility] ORS;  Service: General;  Laterality: N/A;     Family History  Problem Relation Age of Onset  . Diabetes Mother   . Colon cancer Maternal Aunt   . Prostate cancer Maternal Uncle   . Bladder Cancer Neg Hx   . Kidney cancer Neg Hx     Social History:  reports that he quit smoking about 12 years ago. His smoking use included cigarettes. He has a 30.00 pack-year smoking history. He has never used smokeless tobacco. He reports that he does not drink alcohol and does not use drugs.  Allergies:  Allergies  Allergen Reactions  . Fish-Derived Products Nausea And Vomiting    Reaction to mussells only    Medications reviewed.    ROS Full ROS performed and is otherwise negative other than what is stated in HPI   BP 125/80   Pulse 84   Temp 98.3 F (36.8 C) (Oral)   Resp 12   Ht 5\' 11"  (1.803 m)   Wt 192 lb (87.1 kg)   SpO2 97%   BMI 26.78 kg/m   Physical Exam Vitals and nursing note reviewed. Exam conducted with a chaperone present.  Constitutional:      General: He is not in acute distress.    Appearance: Normal appearance.  Eyes:     General: No scleral icterus.       Right eye: No discharge.  Left eye: No discharge.  Cardiovascular:     Rate and Rhythm: Normal rate and regular rhythm.     Heart sounds: No murmur heard.   Pulmonary:     Effort: Pulmonary effort is normal. No respiratory distress.     Breath sounds: Normal breath sounds. No stridor.  Abdominal:     General: Abdomen is flat. There is no distension.     Palpations: Abdomen is soft. There is no mass.     Tenderness: There is no abdominal tenderness. There is no rebound.     Hernia: A hernia is present.     Comments: Left inguinal hernia reducible mildly tender.  Questionable inguinal hernia in the right side but I cannot definitively tell a physical exam.  Musculoskeletal:        General: Normal range of motion.     Cervical back: Normal range of motion and neck supple. No rigidity or tenderness.  Skin:    General: Skin is warm and  dry.     Capillary Refill: Capillary refill takes less than 2 seconds.  Neurological:     General: No focal deficit present.     Mental Status: He is alert and oriented to person, place, and time.  Psychiatric:        Mood and Affect: Mood normal.        Behavior: Behavior normal.        Thought Content: Thought content normal.        Judgment: Judgment normal.     Assessment/Plan: 69 year old male with resolved small bowel obstruction now with left inguinal hernia.  Discussed with the patient in detail I do think that he will be a good candidate for robotic approach.  He also understands that in the case that we find bilateral disease we will be fixing both.  Currently on exam is questionable whether or not he has a defect of the right side.  Procedure discussed with the patient in detail.  Risks, benefits and possible complications including but not limited to: Bleeding, infection, chronic pain, nerve injury and recurrence.  He understands and wishes to proceed Greater than 50% of the 40 minutes  visit was spent in counseling/coordination of care   Caroleen Hamman, MD [place] Surgeon

## 2020-07-12 ENCOUNTER — Telehealth: Payer: Self-pay | Admitting: Emergency Medicine

## 2020-07-12 NOTE — Telephone Encounter (Signed)
Called office to get update on clearance. Spoke to [place]. She states she sent a message to their office and will have someone call the office with updated information.

## 2020-07-16 ENCOUNTER — Other Ambulatory Visit: Payer: Self-pay | Admitting: Urology

## 2020-07-18 ENCOUNTER — Other Ambulatory Visit
Admission: RE | Admit: 2020-07-18 | Discharge: 2020-07-18 | Disposition: A | Discharge status: Home / Self Care | Payer: PPO | Source: Ambulatory Visit | Attending: Surgery | Admitting: Surgery

## 2020-07-18 ENCOUNTER — Other Ambulatory Visit: Payer: Self-pay

## 2020-07-18 DIAGNOSIS — Z01812 Encounter for preprocedural laboratory examination: Secondary | ICD-10-CM | POA: Insufficient documentation

## 2020-07-18 NOTE — Patient Instructions (Addendum)
INSTRUCTIONS FOR SURGERY     Your surgery is scheduled for:   Thursday, AUGUST 26TH     To find out your arrival time for the day of surgery,          please call [phone] between 1 pm and 3 pm on :  Wednesday, AUGUST 25TH     When you arrive for surgery, report to the [facility].       Do NOT stop on the first floor to register.    REMEMBER: Instructions that are not followed completely may result in serious medical risk,  up to and including death, or upon the discretion of your surgeon and anesthesiologist,            your surgery may need to be rescheduled.  __X__ 1. Do not eat food after midnight the night before your procedure.                    No gum, candy, lozenger, tic tacs, tums or hard candies.                  ABSOLUTELY NOTHING SOLID IN YOUR MOUTH AFTER MIDNIGHT                    You may drink unlimited clear liquids up to 2 hours before you are scheduled to arrive for surgery.                   Do not drink anything within those 2 hours unless you need to take medicine, then take the                   smallest amount you need.  Clear liquids include:  water, apple juice without pulp,                   any flavor Gatorade, Black coffee, black tea.  Sugar may be added but no dairy/ honey /lemon.                        Broth and jello is not considered a clear liquid.  __x__  2. On the morning of surgery, please brush your teeth with toothpaste and water. You may rinse with                  mouthwash if you wish but DO NOT SWALLOW TOOTHPASTE OR MOUTHWASH  __X___3. NO alcohol for 24 hours before or after surgery.  __x___ 4.  Do NOT smoke or use e-cigarettes for 24 HOURS PRIOR TO SURGERY.                      DO NOT Use any chewable tobacco products for at least 6 hours prior to surgery.  __x___ 5. If you start any new medication after this appointment and prior to surgery, please                    Bring it with you on the day of surgery.  ___x__ 6. Notify your doctor if there is any change in  your medical condition, such as fever,                  infection, vomitting, diarrhea or any open sores.  __x___ 7.  USE the CHG SOAP as instructed, the night before surgery and the day of surgery.                   Once you have washed with this soap, do NOT use any of the following: Powders, perfumes                    or lotions. Please do not wear make up, hairpins, clips or nail polish. You may wear deodorant.                   Men may shave their face and neck.  Women need to shave 48 hours prior to surgery.                   DO NOT wear ANY jewelry on the day of surgery. If there are rings that are too tight to                    remove easily, please address this prior to the surgery day. Piercings need to be removed.                                                                     NO METAL ON YOUR BODY.                    Do NOT bring any valuables.  If you came to Pre-Admit testing then you will not need license,                     insurance card or credit card.  If you will be staying overnight, please either leave your things in                     the car or have your family be responsible for these items.                     [facility] IS NOT RESPONSIBLE FOR BELONGINGS OR VALUABLES.  ___X__ 8. DO NOT wear contact lenses on surgery day.  You may not have dentures,                     Hearing aides, contacts or glasses in the operating room. These items can be                    Placed in the Recovery Room to receive immediately after surgery.  __x___ 9. IF YOU ARE SCHEDULED TO GO HOME ON THE SAME DAY, YOU MUST                   Have someone to drive you home and to stay with you  for the first 24 hours.                    Have an arrangement prior to arriving on surgery day.  ___x__ 10. Take the following medications on the  morning of surgery with a sip of water:                               1. FLONASE, if needed                     2.  _____ 11.  Follow any instructions provided to you by your surgeon.                        Such as enema, clear liquid bowel prep  __X__  12. STOP ALL ASPIRIN PRODUCTS ONE WEEK PRIOR TO SURGERY.  (07/21/20)                       THIS INCLUDES BC POWDERS / GOODIES POWDER  __x___ 13. STOP Anti-inflammatories as of: ONE WEEK PRIOR TO SURGERY.  (07/21/20)                      This includes IBUPROFEN / MOTRIN / ADVIL / ALEVE/ NAPROXYN / MOBIC                    YOU MAY TAKE TYLENOL ANY TIME PRIOR TO SURGERY.  _X____ 14.  Stop supplements until after surgery.                     This includes: B COMPLEX // GINSENG // MAGNESIUM SULFATE // MULTIVITAMINS //                        SAW PALMETTO //  MELOXICAM                 __X____17.  Continue to take the following medications but do not take on the morning of surgery:                                      PSYLLIUM  __X____18.  Wear clean and comfortable clothing to the hospital.  PLEASE MAKE ARRANGEMENTS AS TO WHO YOU WILL BE WITH FOR 24 HOURS AFTER    SURGERY.  HAVE PHONE NUMBERS OF CONTACT PERSON FOR DR. PABON TO CALL.  HAVE STOOL SOFTENERS AT HOME TO TAKE AFTER SURGERY.  IT IS RECOMMENDED THAT    YOU START TAKING THEM A DAY OR 2 PRIOR TO SURGERY.

## 2020-07-19 DIAGNOSIS — L989 Disorder of the skin and subcutaneous tissue, unspecified: Secondary | ICD-10-CM | POA: Diagnosis not present

## 2020-07-19 DIAGNOSIS — I2510 Atherosclerotic heart disease of native coronary artery without angina pectoris: Secondary | ICD-10-CM | POA: Diagnosis not present

## 2020-07-19 DIAGNOSIS — R7309 Other abnormal glucose: Secondary | ICD-10-CM | POA: Diagnosis not present

## 2020-07-21 ENCOUNTER — Telehealth: Payer: Self-pay | Admitting: Emergency Medicine

## 2020-07-21 NOTE — Telephone Encounter (Signed)
Medical clearance obtained through [facility]- office visit 07/19/20 Dr Ouida Sills.    Per Dr Ouida Sills, patient "Can proceed directly for an inguinal hernia repair without further testing"

## 2020-07-22 DIAGNOSIS — D2262 Melanocytic nevi of left upper limb, including shoulder: Secondary | ICD-10-CM | POA: Diagnosis not present

## 2020-07-22 DIAGNOSIS — D044 Carcinoma in situ of skin of scalp and neck: Secondary | ICD-10-CM | POA: Diagnosis not present

## 2020-07-22 DIAGNOSIS — D2361 Other benign neoplasm of skin of right upper limb, including shoulder: Secondary | ICD-10-CM | POA: Diagnosis not present

## 2020-07-22 DIAGNOSIS — D2271 Melanocytic nevi of right lower limb, including hip: Secondary | ICD-10-CM | POA: Diagnosis not present

## 2020-07-22 DIAGNOSIS — L814 Other melanin hyperpigmentation: Secondary | ICD-10-CM | POA: Diagnosis not present

## 2020-07-22 DIAGNOSIS — D225 Melanocytic nevi of trunk: Secondary | ICD-10-CM | POA: Diagnosis not present

## 2020-07-22 DIAGNOSIS — D2261 Melanocytic nevi of right upper limb, including shoulder: Secondary | ICD-10-CM | POA: Diagnosis not present

## 2020-07-22 DIAGNOSIS — D2272 Melanocytic nevi of left lower limb, including hip: Secondary | ICD-10-CM | POA: Diagnosis not present

## 2020-07-22 DIAGNOSIS — D485 Neoplasm of uncertain behavior of skin: Secondary | ICD-10-CM | POA: Diagnosis not present

## 2020-07-22 DIAGNOSIS — L821 Other seborrheic keratosis: Secondary | ICD-10-CM | POA: Diagnosis not present

## 2020-07-26 ENCOUNTER — Other Ambulatory Visit: Payer: Self-pay

## 2020-07-26 ENCOUNTER — Other Ambulatory Visit
Admission: RE | Admit: 2020-07-26 | Discharge: 2020-07-26 | Disposition: A | Discharge status: Home / Self Care | Payer: PPO | Source: Ambulatory Visit | Attending: Surgery | Admitting: Surgery

## 2020-07-26 DIAGNOSIS — Z01812 Encounter for preprocedural laboratory examination: Secondary | ICD-10-CM | POA: Insufficient documentation

## 2020-07-26 DIAGNOSIS — Z20822 Contact with and (suspected) exposure to covid-19: Secondary | ICD-10-CM | POA: Diagnosis not present

## 2020-07-26 LAB — BASIC METABOLIC PANEL
Anion gap: 8 (ref 5–15)
BUN: 19 mg/dL (ref 8–23)
CO2: 25 mmol/L (ref 22–32)
Calcium: 9 mg/dL (ref 8.9–10.3)
Chloride: 110 mmol/L (ref 98–111)
Creatinine, Ser: 0.72 mg/dL (ref 0.61–1.24)
GFR calc Af Amer: >60 mL/min (ref >60)
GFR calc non Af Amer: >60 mL/min (ref >60)
Glucose, Bld: 98 mg/dL (ref 70–99)
Potassium: 3.9 mmol/L (ref 3.5–5.1)
Sodium: 143 mmol/L (ref 135–145)

## 2020-07-26 LAB — CBC
HCT: 43 % (ref 39.0–52.0)
Hemoglobin: 14.9 g/dL (ref 13.0–17.0)
MCH: 30.1 pg (ref 26.0–34.0)
MCHC: 34.7 g/dL (ref 30.0–36.0)
MCV: 86.9 fL (ref 80.0–100.0)
Platelets: 200 10*3/uL (ref 150–400)
RBC: 4.95 MIL/uL (ref 4.22–5.81)
RDW: 14.2 % (ref 11.5–15.5)
WBC: 9.7 10*3/uL (ref 4.0–10.5)
nRBC: 0 % (ref 0.0–0.2)

## 2020-07-26 LAB — SARS CORONAVIRUS 2 (TAT 6-24 HRS): SARS Coronavirus 2: NEGATIVE

## 2020-07-28 ENCOUNTER — Ambulatory Visit: Payer: PPO | Admitting: Anesthesiology

## 2020-07-28 ENCOUNTER — Encounter
Admission: RE | Disposition: A | Discharge status: Home / Self Care | Payer: Self-pay | Source: Home / Self Care | Attending: Surgery

## 2020-07-28 ENCOUNTER — Other Ambulatory Visit: Payer: Self-pay

## 2020-07-28 ENCOUNTER — Encounter: Payer: Self-pay | Admitting: Surgery

## 2020-07-28 ENCOUNTER — Ambulatory Visit
Admission: RE | Admit: 2020-07-28 | Discharge: 2020-07-28 | Disposition: A | Discharge status: Home / Self Care | Payer: PPO | Attending: Surgery | Admitting: Surgery

## 2020-07-28 DIAGNOSIS — Z87891 Personal history of nicotine dependence: Secondary | ICD-10-CM | POA: Insufficient documentation

## 2020-07-28 DIAGNOSIS — K4090 Unilateral inguinal hernia, without obstruction or gangrene, not specified as recurrent: Secondary | ICD-10-CM | POA: Diagnosis not present

## 2020-07-28 DIAGNOSIS — Z8546 Personal history of malignant neoplasm of prostate: Secondary | ICD-10-CM | POA: Insufficient documentation

## 2020-07-28 DIAGNOSIS — I10 Essential (primary) hypertension: Secondary | ICD-10-CM | POA: Diagnosis not present

## 2020-07-28 HISTORY — PX: XI ROBOTIC ASSISTED INGUINAL HERNIA REPAIR WITH MESH: SHX6706

## 2020-07-28 SURGERY — REPAIR, HERNIA, INGUINAL, ROBOT-ASSISTED, LAPAROSCOPIC, USING MESH
Anesthesia: General | Laterality: Left

## 2020-07-28 MED ORDER — MIDAZOLAM HCL 2 MG/2ML IJ SOLN
INTRAMUSCULAR | Status: DC | PRN
  Administered 2020-07-28: 2 mg via INTRAVENOUS

## 2020-07-28 MED ORDER — FENTANYL CITRATE (PF) 250 MCG/5ML IJ SOLN
INTRAMUSCULAR | Status: AC
  Filled 2020-07-28: qty 5

## 2020-07-28 MED ORDER — EPHEDRINE SULFATE 50 MG/ML IJ SOLN
INTRAMUSCULAR | Status: DC | PRN
  Administered 2020-07-28 (×2): 5 mg via INTRAVENOUS

## 2020-07-28 MED ORDER — PROPOFOL 10 MG/ML IV BOLUS
INTRAVENOUS | Status: AC
  Filled 2020-07-28: qty 20

## 2020-07-28 MED ORDER — DEXAMETHASONE SODIUM PHOSPHATE 10 MG/ML IJ SOLN
INTRAMUSCULAR | Status: DC | PRN
  Administered 2020-07-28: 10 mg via INTRAVENOUS

## 2020-07-28 MED ORDER — ONDANSETRON HCL 4 MG/2ML IJ SOLN
INTRAMUSCULAR | Status: DC | PRN
  Administered 2020-07-28: 4 mg via INTRAVENOUS

## 2020-07-28 MED ORDER — OXYCODONE HCL 5 MG/5ML PO SOLN: 5.0000 mg | Freq: Once | ORAL | Status: AC | PRN

## 2020-07-28 MED ORDER — ACETAMINOPHEN 500 MG PO TABS: 1000.0000 mg | ORAL_TABLET | ORAL | Status: AC

## 2020-07-28 MED ORDER — CHLORHEXIDINE GLUCONATE CLOTH 2 % EX PADS: 6.0000 | MEDICATED_PAD | Freq: Once | CUTANEOUS | Status: DC

## 2020-07-28 MED ORDER — HYDROMORPHONE HCL 1 MG/ML IJ SOLN
INTRAMUSCULAR | Status: AC
  Filled 2020-07-28: qty 1

## 2020-07-28 MED ORDER — CHLORHEXIDINE GLUCONATE 0.12 % MT SOLN
OROMUCOSAL | Status: AC
  Administered 2020-07-28: 15 mL via OROMUCOSAL
  Filled 2020-07-28: qty 15

## 2020-07-28 MED ORDER — ROCURONIUM BROMIDE 10 MG/ML (PF) SYRINGE
PREFILLED_SYRINGE | INTRAVENOUS | Status: AC
  Filled 2020-07-28: qty 10

## 2020-07-28 MED ORDER — FENTANYL CITRATE (PF) 100 MCG/2ML IJ SOLN: 25.0000 ug | INTRAMUSCULAR | Status: DC | PRN

## 2020-07-28 MED ORDER — FENTANYL CITRATE (PF) 100 MCG/2ML IJ SOLN
INTRAMUSCULAR | Status: AC
  Filled 2020-07-28: qty 2

## 2020-07-28 MED ORDER — HYDROMORPHONE HCL 1 MG/ML IJ SOLN
INTRAMUSCULAR | Status: DC | PRN
  Administered 2020-07-28 (×2): .5 mg via INTRAVENOUS

## 2020-07-28 MED ORDER — GABAPENTIN 300 MG PO CAPS
ORAL_CAPSULE | ORAL | Status: AC
  Administered 2020-07-28: 300 mg via ORAL
  Filled 2020-07-28: qty 1

## 2020-07-28 MED ORDER — OXYCODONE HCL 5 MG PO TABS
ORAL_TABLET | ORAL | Status: AC
  Filled 2020-07-28: qty 1

## 2020-07-28 MED ORDER — ACETAMINOPHEN 500 MG PO TABS
ORAL_TABLET | ORAL | Status: AC
  Administered 2020-07-28: 1000 mg via ORAL
  Filled 2020-07-28: qty 2

## 2020-07-28 MED ORDER — ONDANSETRON HCL 4 MG/2ML IJ SOLN: 4.0000 mg | Freq: Once | INTRAMUSCULAR | Status: DC | PRN

## 2020-07-28 MED ORDER — ONDANSETRON HCL 4 MG/2ML IJ SOLN
INTRAMUSCULAR | Status: AC
  Filled 2020-07-28: qty 2

## 2020-07-28 MED ORDER — ROCURONIUM BROMIDE 100 MG/10ML IV SOLN
INTRAVENOUS | Status: DC | PRN
  Administered 2020-07-28: 50 mg via INTRAVENOUS

## 2020-07-28 MED ORDER — FENTANYL CITRATE (PF) 100 MCG/2ML IJ SOLN
INTRAMUSCULAR | Status: DC | PRN
  Administered 2020-07-28: 100 ug via INTRAVENOUS
  Administered 2020-07-28: 50 ug via INTRAVENOUS
  Administered 2020-07-28 (×2): 100 ug via INTRAVENOUS

## 2020-07-28 MED ORDER — PROPOFOL 10 MG/ML IV BOLUS
INTRAVENOUS | Status: DC | PRN
  Administered 2020-07-28: 150 mg via INTRAVENOUS

## 2020-07-28 MED ORDER — CHLORHEXIDINE GLUCONATE 0.12 % MT SOLN: 15.0000 mL | Freq: Once | OROMUCOSAL | Status: AC

## 2020-07-28 MED ORDER — ORAL CARE MOUTH RINSE: 15.0000 mL | Freq: Once | OROMUCOSAL | Status: AC

## 2020-07-28 MED ORDER — LACTATED RINGERS IV SOLN: INTRAVENOUS | Status: DC

## 2020-07-28 MED ORDER — MIDAZOLAM HCL 2 MG/2ML IJ SOLN
INTRAMUSCULAR | Status: AC
  Filled 2020-07-28: qty 2

## 2020-07-28 MED ORDER — HYDROCODONE-ACETAMINOPHEN 5-325 MG PO TABS: 1.0000 | ORAL_TABLET | Freq: Four times a day (QID) | ORAL | 0 refills | Status: DC | PRN

## 2020-07-28 MED ORDER — CEFAZOLIN SODIUM-DEXTROSE 2-4 GM/100ML-% IV SOLN
INTRAVENOUS | Status: AC
  Filled 2020-07-28: qty 100

## 2020-07-28 MED ORDER — BUPIVACAINE-EPINEPHRINE (PF) 0.25% -1:200000 IJ SOLN
INTRAMUSCULAR | Status: AC
  Filled 2020-07-28: qty 30

## 2020-07-28 MED ORDER — DEXAMETHASONE SODIUM PHOSPHATE 10 MG/ML IJ SOLN
INTRAMUSCULAR | Status: AC
  Filled 2020-07-28: qty 1

## 2020-07-28 MED ORDER — LIDOCAINE HCL (CARDIAC) PF 100 MG/5ML IV SOSY
PREFILLED_SYRINGE | INTRAVENOUS | Status: DC | PRN
  Administered 2020-07-28: 100 mg via INTRAVENOUS

## 2020-07-28 MED ORDER — GLYCOPYRROLATE 0.2 MG/ML IJ SOLN
INTRAMUSCULAR | Status: AC
  Filled 2020-07-28: qty 1

## 2020-07-28 MED ORDER — OXYCODONE HCL 5 MG PO TABS
5.0000 mg | ORAL_TABLET | Freq: Once | ORAL | Status: AC | PRN
  Administered 2020-07-28: 5 mg via ORAL

## 2020-07-28 MED ORDER — CELECOXIB 200 MG PO CAPS
ORAL_CAPSULE | ORAL | Status: AC
  Administered 2020-07-28: 200 mg via ORAL
  Filled 2020-07-28: qty 1

## 2020-07-28 MED ORDER — KETOROLAC TROMETHAMINE 30 MG/ML IJ SOLN
INTRAMUSCULAR | Status: DC | PRN
  Administered 2020-07-28: 30 mg via INTRAVENOUS

## 2020-07-28 MED ORDER — LIDOCAINE HCL (PF) 2 % IJ SOLN
INTRAMUSCULAR | Status: AC
  Filled 2020-07-28: qty 5

## 2020-07-28 MED ORDER — CEFAZOLIN SODIUM-DEXTROSE 2-4 GM/100ML-% IV SOLN: 2.0000 g | INTRAVENOUS | Status: DC

## 2020-07-28 MED ORDER — CELECOXIB 200 MG PO CAPS: 200.0000 mg | ORAL_CAPSULE | ORAL | Status: AC

## 2020-07-28 MED ORDER — SUGAMMADEX SODIUM 200 MG/2ML IV SOLN
INTRAVENOUS | Status: DC | PRN
  Administered 2020-07-28: 200 mg via INTRAVENOUS

## 2020-07-28 MED ORDER — GABAPENTIN 300 MG PO CAPS: 300.0000 mg | ORAL_CAPSULE | ORAL | Status: AC

## 2020-07-28 MED ORDER — EPHEDRINE 5 MG/ML INJ
INTRAVENOUS | Status: AC
  Filled 2020-07-28: qty 10

## 2020-07-28 MED ORDER — KETOROLAC TROMETHAMINE 30 MG/ML IJ SOLN
INTRAMUSCULAR | Status: AC
  Filled 2020-07-28: qty 1

## 2020-07-28 SURGICAL SUPPLY — 52 items
"PENCIL ELECTRO HAND CTR " (MISCELLANEOUS) ×1 IMPLANT
ADH SKN CLS APL DERMABOND .7 (GAUZE/BANDAGES/DRESSINGS) ×1
APL PRP STRL LF DISP 70% ISPRP (MISCELLANEOUS) ×1
CANISTER SUCT 1200ML W/VALVE (MISCELLANEOUS) ×2 IMPLANT
CANNULA REDUC XI 12-8 STAPL (CANNULA) ×1
CANNULA REDUCER 12-8 DVNC XI (CANNULA) ×1 IMPLANT
CHLORAPREP W/TINT 26 (MISCELLANEOUS) ×2 IMPLANT
COVER TIP SHEARS 8 DVNC (MISCELLANEOUS) ×1 IMPLANT
COVER TIP SHEARS 8MM DA VINCI (MISCELLANEOUS) ×1
COVER WAND RF STERILE (DRAPES) ×2 IMPLANT
DEFOGGER SCOPE WARMER CLEARIFY (MISCELLANEOUS) ×2 IMPLANT
DERMABOND ADVANCED (GAUZE/BANDAGES/DRESSINGS) ×1
DERMABOND ADVANCED .7 DNX12 (GAUZE/BANDAGES/DRESSINGS) ×1 IMPLANT
DRAPE 3/4 80X56 (DRAPES) ×2 IMPLANT
DRAPE ARM DVNC X/XI (DISPOSABLE) ×3 IMPLANT
DRAPE COLUMN DVNC XI (DISPOSABLE) ×1 IMPLANT
DRAPE DA VINCI XI ARM (DISPOSABLE) ×3
DRAPE DA VINCI XI COLUMN (DISPOSABLE) ×1
ELECT CAUTERY BLADE 6.4 (BLADE) ×2 IMPLANT
ELECT REM PT RETURN 9FT ADLT (ELECTROSURGICAL) ×2
ELECTRODE REM PT RTRN 9FT ADLT (ELECTROSURGICAL) ×1 IMPLANT
GLOVE BIO SURGEON STRL SZ7 (GLOVE) ×8 IMPLANT
GOWN STRL REUS W/ TWL LRG LVL3 (GOWN DISPOSABLE) ×4 IMPLANT
GOWN STRL REUS W/TWL LRG LVL3 (GOWN DISPOSABLE) ×8
IRRIGATION STRYKERFLOW (MISCELLANEOUS) ×1 IMPLANT
IRRIGATOR STRYKERFLOW (MISCELLANEOUS) ×2
IV NS 1000ML (IV SOLUTION)
IV NS 1000ML BAXH (IV SOLUTION) IMPLANT
KIT PINK PAD W/HEAD ARE REST (MISCELLANEOUS) ×2
KIT PINK PAD W/HEAD ARM REST (MISCELLANEOUS) ×1 IMPLANT
LABEL OR SOLS (LABEL) ×2 IMPLANT
MESH 3DMAX 4X6 LT LRG (Mesh General) ×1 IMPLANT
NEEDLE HYPO 22GX1.5 SAFETY (NEEDLE) ×2 IMPLANT
OBTURATOR OPTICAL STANDARD 8MM (TROCAR) ×1
OBTURATOR OPTICAL STND 8 DVNC (TROCAR) ×1
OBTURATOR OPTICALSTD 8 DVNC (TROCAR) ×1 IMPLANT
PACK LAP CHOLECYSTECTOMY (MISCELLANEOUS) ×2 IMPLANT
PENCIL ELECTRO HAND CTR (MISCELLANEOUS) ×2 IMPLANT
SEAL CANN UNIV 5-8 DVNC XI (MISCELLANEOUS) ×3 IMPLANT
SEAL XI 5MM-8MM UNIVERSAL (MISCELLANEOUS) ×3
SET TUBE SMOKE EVAC HIGH FLOW (TUBING) ×2 IMPLANT
SOLUTION ELECTROLUBE (MISCELLANEOUS) ×2 IMPLANT
SPONGE LAP 18X18 RF (DISPOSABLE) ×2 IMPLANT
STAPLER CANNULA SEAL DVNC XI (STAPLE) ×1 IMPLANT
STAPLER CANNULA SEAL XI (STAPLE) ×1
SUT MNCRL AB 4-0 PS2 18 (SUTURE) ×2 IMPLANT
SUT VIC AB 2-0 SH 27 (SUTURE) ×4
SUT VIC AB 2-0 SH 27XBRD (SUTURE) ×2 IMPLANT
SUT VICRYL 0 AB UR-6 (SUTURE) ×4 IMPLANT
SUT VLOC 90 S/L VL9 GS22 (SUTURE) ×2 IMPLANT
TAPE TRANSPORE STRL 2 31045 (GAUZE/BANDAGES/DRESSINGS) ×2 IMPLANT
TROCAR BALLN GELPORT 12X130M (ENDOMECHANICALS) ×2 IMPLANT

## 2020-07-28 NOTE — Discharge Instructions (Addendum)
AMBULATORY SURGERY  DISCHARGE INSTRUCTIONS   1) The drugs that you were given will stay in your system until tomorrow so for the next 24 hours you should not:  A) Drive an automobile B) Make any legal decisions C) Drink any alcoholic beverage   2) You may resume regular meals tomorrow.  Today it is better to start with liquids and gradually work up to solid foods.  You may eat anything you prefer, but it is better to start with liquids, then soup and crackers, and gradually work up to solid foods.   3) Please notify your doctor immediately if you have any unusual bleeding, trouble breathing, redness and pain at the surgery site, drainage, fever, or pain not relieved by medication. 4)   5) Your post-operative visit with Dr.                                     is: Date:                        Time:    Please call to schedule your post-operative visit.  6) Additional Instructions:    Laparoscopic Inguinal Hernia Repair, Adult, Care After This sheet gives you information about how to care for yourself after your procedure. Your health care provider may also give you more specific instructions. If you have problems or questions, contact your health care provider. What can I expect after the procedure? After the procedure, it is common to have:  Pain.  Swelling and bruising around the incision area.  Scrotal swelling, in men.  Some fluid or blood draining from your incisions. Follow these instructions at home: Incision care  Follow instructions from your health care provider about how to take care of your incisions. Make sure you: ? Wash your hands with soap and water before you change your bandage (dressing). If soap and water are not available, use hand sanitizer. ? Change your dressing as told by your health care provider. ? Leave stitches (sutures), skin glue, or adhesive strips in place. These skin closures may need to stay in place for 2 weeks or longer. If adhesive  strip edges start to loosen and curl up, you may trim the loose edges. Do not remove adhesive strips completely unless your health care provider tells you to do that.  Check your incision area every day for signs of infection. Check for: ? More redness, swelling, or pain. ? More fluid or blood. ? Warmth. ? Pus or a bad smell.  Wear loose, soft clothing while your incisions heal. Driving  Do not drive or use heavy machinery while taking prescription pain medicine.  Do not drive for 24 hours if you were given a medicine to help you relax (sedative) during your procedure. Activity  Do not lift anything that is heavier than 10 lb (4.5 kg), or the limit that you are told, until your health care provider says that it is safe.  Ask your health care provider what activities are safe for you. A lot of activity during the first week after surgery can increase pain and swelling. For 1 week after your procedure: ? Avoid activities that take a lot of effort, such as exercise or sports. ? You may walk and climb stairs as needed for daily activity, but avoid long walks or climbing stairs for exercise. Managing pain and swelling   Put ice on  painful or swollen areas: ? Put ice in a plastic bag. ? Place a towel between your skin and the bag. ? Leave the ice on for 20 minutes, 2-3 times a day. General instructions  Do not take baths, swim, or use a hot tub until your health care provider approves. Ask your health care provider if you may take showers. You may only be allowed to take sponge baths.  Take over-the-counter and prescription medicines only as told by your health care provider.  To prevent or treat constipation while you are taking prescription pain medicine, your health care provider may recommend that you: ? Drink enough fluid to keep your urine pale yellow. ? Take over-the-counter or prescription medicines. ? Eat foods that are high in fiber, such as fresh fruits and vegetables, whole  grains, and beans. ? Limit foods that are high in fat and processed sugars, such as fried and sweet foods.  Do not use any products that contain nicotine or tobacco, such as cigarettes and e-cigarettes. If you need help quitting, ask your health care provider.  Drink enough fluid to keep your urine pale yellow.  Keep all follow-up visits as told by your health care provider. This is important. Contact a health care provider if:  You have more redness, swelling, or pain around your incisions or your groin area.  You have more swelling in your scrotum.  You have more fluid or blood coming from your incisions.  Your incisions feel warm to the touch.  You have severe pain and medicines do not help.  You have abdominal pain or swelling.  You cannot eat or drink without vomiting.  You cannot urinate or pass a bowel movement.  You faint.  You feel dizzy.  You have nausea and vomiting.  You have a fever. Get help right away if:  You have pus or a bad smell coming from your incisions.  You have redness, warmth, or pain in your leg.  You have chest pain.  You have problems breathing. Summary  Pain, swelling, and bruising are common after the procedure.  Check your incision area every day for signs of infection, such as more redness, swelling, or pain.  Put ice on painful or swollen areas for 20 minutes, 2-3 times a day. This information is not intended to replace advice given to you by your health care provider. Make sure you discuss any questions you have with your health care provider. Document Revised: 04/29/2019 Document Reviewed: 02/28/2017 Elsevier Patient Education  [place].

## 2020-07-28 NOTE — Anesthesia Procedure Notes (Signed)
Procedure Name: Intubation Date/Time: 07/28/2020 7:44 AM Performed by: Genevie Ann, CRNA Pre-anesthesia Checklist: Patient identified, Emergency Drugs available, Suction available, Patient being monitored and Timeout performed Patient Re-evaluated:Patient Re-evaluated prior to induction Oxygen Delivery Method: Circle system utilized Preoxygenation: Pre-oxygenation with 100% oxygen Induction Type: IV induction Ventilation: Mask ventilation without difficulty Laryngoscope Size: Mac and 3 Grade View: Grade III Tube type: Oral Tube size: 7.0 mm Number of attempts: 1 Airway Equipment and Method: Stylet Placement Confirmation: ETT inserted through vocal cords under direct vision,  positive ETCO2 and breath sounds checked- equal and bilateral Secured at: 22 cm Dental Injury: Teeth and Oropharynx as per pre-operative assessment

## 2020-07-28 NOTE — Anesthesia Preprocedure Evaluation (Signed)
Anesthesia Evaluation  Patient identified by MRN, date of birth, ID band Patient awake    Reviewed: Allergy & Precautions, NPO status , Patient's Chart, lab work & pertinent test results  History of Anesthesia Complications Negative for: history of anesthetic complications  Airway Mallampati: II  TM Distance: >3 FB Neck ROM: Full    Dental no notable dental hx. (+) Teeth Intact, Dental Advisory Given   Pulmonary neg sleep apnea, neg COPD, Current Smoker and Patient abstained from smoking.,  Cigar smoker occasionally, last smoked yesterday   Pulmonary exam normal breath sounds clear to auscultation       Cardiovascular Exercise Tolerance: Good METShypertension, (-) CAD, (-) Past MI and (-) CHF (-) dysrhythmias (-) Valvular Problems/Murmurs Rhythm:Regular Rate:Normal - Systolic murmurs    Neuro/Psych neg Seizures negative neurological ROS  negative psych ROS   GI/Hepatic Neg liver ROS, neg GERD (no meds now)  ,  Endo/Other  neg diabetes  Renal/GU negative Renal ROS     Musculoskeletal   Abdominal   Peds  Hematology   Anesthesia Other Findings Past Medical History: No date: GERD (gastroesophageal reflux disease)     Comment:  PT TAKES APPLE CIDER VINEGAR  No date: Hypertension     Comment:  borderline pressures.  takes no meds No date: Prostate cancer [facility])     Comment:   needs no treatment. still on observation  Reproductive/Obstetrics                             Anesthesia Physical  Anesthesia Plan  ASA: II  Anesthesia Plan: General   Post-op Pain Management:    Induction: Intravenous  PONV Risk Score and Plan: 2 and Ondansetron, Dexamethasone and Midazolam  Airway Management Planned: Oral ETT  Additional Equipment: None  Intra-op Plan:   Post-operative Plan: Extubation in OR  Informed Consent: I have reviewed the patients History and Physical, chart, labs and discussed  the procedure including the risks, benefits and alternatives for the proposed anesthesia with the patient or authorized representative who has indicated his/her understanding and acceptance.     Dental advisory given  Plan Discussed with: CRNA and Surgeon  Anesthesia Plan Comments: (Discussed risks of anesthesia with patient, including PONV, sore throat, lip/dental damage. Rare risks discussed as well, such as cardiorespiratory and neurological sequelae. Patient understands. Patient counseled on benefits of smoking cessation, and increased perioperative risks associated with continued smoking. )        Anesthesia Quick Evaluation

## 2020-07-28 NOTE — Anesthesia Postprocedure Evaluation (Signed)
Anesthesia Post Note  Patient: [name]  Procedure(s) Performed: XI ROBOTIC ASSISTED INGUINAL HERNIA REPAIR WITH MESH (Left )  Patient location during evaluation: PACU Anesthesia Type: General Level of consciousness: awake and alert Pain management: pain level controlled Vital Signs Assessment: post-procedure vital signs reviewed and stable Respiratory status: spontaneous breathing, nonlabored ventilation, respiratory function stable and patient connected to nasal cannula oxygen Cardiovascular status: blood pressure returned to baseline and stable Postop Assessment: no apparent nausea or vomiting Anesthetic complications: no   No complications documented.   Last Vitals:  Vitals:   07/28/20 0954 07/28/20 1028  BP: (!) 150/74 (!) 142/79  Pulse: 68 61  Resp: 16 18  Temp: (!) 36 C (!) 36.2 C  SpO2: 99% 98%    Last Pain:  Vitals:   07/28/20 1028  TempSrc: Temporal  PainSc: 5                  Arita Miss

## 2020-07-28 NOTE — Addendum Note (Signed)
Addendum  created 07/28/20 1430 by Genevie Ann, CRNA   Charge Capture section accepted

## 2020-07-28 NOTE — Transfer of Care (Signed)
Immediate Anesthesia Transfer of Care Note  Patient: Adrian Hart  Procedure(s) Performed: XI ROBOTIC ASSISTED INGUINAL HERNIA REPAIR WITH MESH (Left )  Patient Location: PACU  Anesthesia Type:General  Level of Consciousness: awake and alert   Airway & Oxygen Therapy: Patient Spontanous Breathing and Patient connected to nasal cannula oxygen  Post-op Assessment: Report given to RN and Post -op Vital signs reviewed and stable  Post vital signs: Reviewed and stable  Last Vitals:  Vitals Value Taken Time  BP 166/94 07/28/20 0900  Temp    Pulse 83 07/28/20 0903  Resp 13 07/28/20 0903  SpO2 96 % 07/28/20 0903  Vitals shown include unvalidated device data.  Last Pain:  Vitals:   07/28/20 0618  TempSrc: Tympanic  PainSc: 0-No pain         Complications: No complications documented.

## 2020-07-28 NOTE — Interval H&P Note (Signed)
History and Physical Interval Note:  07/28/2020 7:24 AM  Adrian Hart  has presented today for surgery, with the diagnosis of Left inguinal hernia.  The various methods of treatment have been discussed with the patient and family. After consideration of risks, benefits and other options for treatment, the patient has consented to  Procedure(s): XI ROBOTIC [place] (Left) as a surgical intervention.  The patient's history has been reviewed, patient examined, no change in status, stable for surgery.  I have reviewed the patient's chart and labs.  Questions were answered to the patient's satisfaction.     [place]

## 2020-07-28 NOTE — Op Note (Signed)
Robotic assisted Laparoscopic Transabdominal Left Inguinal Hernia Repair with 3 D large Mesh       Pre-operative Diagnosis:  Left Inguinal Hernia   Post-operative Diagnosis: Same   Procedure: Robotic  Laparoscopic  repair of left inguinal hernia   Surgeon: Caroleen Hamman, MD FACS   Anesthesia: Gen. with endotracheal tube   Findings: Left indirect  inguinal hernia        Procedure Details  The patient was seen again in the Holding Room. The benefits, complications, treatment options, and expected outcomes were discussed with the patient. The risks of bleeding, infection, recurrence of symptoms, failure to resolve symptoms, recurrence of hernia, ischemic orchitis, chronic pain syndrome or neuroma, were discussed again. The likelihood of improving the patient's symptoms with return to their baseline status is good.  The patient and/or family concurred with the proposed plan, giving informed consent.  The patient was taken to Operating Room, identified  and the procedure verified as Laparoscopic Inguinal Hernia Repair. Laterality confirmed.  A Time Out was held and the above information confirmed.   Prior to the induction of general anesthesia, antibiotic prophylaxis was administered. VTE prophylaxis was in place. General endotracheal anesthesia was then administered and tolerated well. After the induction, the abdomen was prepped with Chloraprep and draped in the sterile fashion. The patient was positioned in the supine position.     Supraumbilical incision created and cut down technique used to enter the abdominal cavity. Fascia elevated and incised and hasson trochar placed. Pneumoperitoneum obtained w/o HD changes. No evidence of bowel injuries.  Two 8 mm placed under direct vision. The laparoscopy revealed large indirect defects. I inserted the needles and the mesh. The robot was brought ot the table and docked in the standard fashion, no collision between arms was observed. Instruments were  kept under direct view at all times.  Only Left inguinal hernia was visualized We started on the Left side were a flap was created. The sac was reduced and dissected free from adjacent structures. We preserved the vas and the vessels. Once dissection was completed a large 3D mesh was placed and secured with two interrupted vicryl attached to the pubic tubercle. There was good coverage of the direct, indirect and femoral spaces. The flap was closed with v lock suture. Second look revealed no complications or injuries.    Once assuring that hemostasis was adequate the ports were removed and a figure-of-eight 0 Vicryl suture was placed at the fascial edges. 4-0 subcuticular Monocryl was used at all skin edges. Dermabond was placed.  Patient tolerated the procedure well. There were no complications. He was taken to the recovery room in stable condition.                 Caroleen Hamman, MD, FACS

## 2020-08-15 ENCOUNTER — Other Ambulatory Visit: Payer: Self-pay

## 2020-08-15 ENCOUNTER — Telehealth (INDEPENDENT_AMBULATORY_CARE_PROVIDER_SITE_OTHER): Payer: PPO | Admitting: Surgery

## 2020-08-15 ENCOUNTER — Encounter: Payer: Self-pay | Admitting: Surgery

## 2020-08-15 DIAGNOSIS — Z09 Encounter for follow-up examination after completed treatment for conditions other than malignant neoplasm: Secondary | ICD-10-CM

## 2020-08-15 NOTE — Progress Notes (Signed)
Phone call placed to pt on his cell phone. S/p rob IH Doing well Some soreness intermittently No fevers or chills, taking PO well No other concerns HE will call back with questions and we will be happy to see him in person if need arises.

## 2020-08-19 DIAGNOSIS — Z20822 Contact with and (suspected) exposure to covid-19: Secondary | ICD-10-CM | POA: Diagnosis not present

## 2020-08-29 ENCOUNTER — Other Ambulatory Visit: Payer: Self-pay | Admitting: Urology

## 2020-09-09 ENCOUNTER — Other Ambulatory Visit: Payer: Self-pay | Admitting: Family Medicine

## 2020-09-09 DIAGNOSIS — C61 Malignant neoplasm of prostate: Secondary | ICD-10-CM

## 2020-09-12 ENCOUNTER — Other Ambulatory Visit: Payer: PPO

## 2020-09-12 ENCOUNTER — Other Ambulatory Visit: Payer: Self-pay

## 2020-09-12 DIAGNOSIS — C61 Malignant neoplasm of prostate: Secondary | ICD-10-CM | POA: Diagnosis not present

## 2020-09-13 LAB — PSA: Prostate Specific Ag, Serum: 12.6 ng/mL — ABNORMAL HIGH (ref 0.0–4.0)

## 2020-09-14 ENCOUNTER — Ambulatory Visit: Payer: PPO | Admitting: Urology

## 2020-09-14 ENCOUNTER — Other Ambulatory Visit: Payer: Self-pay

## 2020-09-14 ENCOUNTER — Encounter: Payer: Self-pay | Admitting: Urology

## 2020-09-14 ENCOUNTER — Other Ambulatory Visit: Payer: Self-pay | Admitting: *Deleted

## 2020-09-14 VITALS — BP 171/80 | HR 101 | Ht 71.0 in | Wt 196.4 lb

## 2020-09-14 DIAGNOSIS — C61 Malignant neoplasm of prostate: Secondary | ICD-10-CM | POA: Diagnosis not present

## 2020-09-14 DIAGNOSIS — R300 Dysuria: Secondary | ICD-10-CM | POA: Diagnosis not present

## 2020-09-14 DIAGNOSIS — Z87891 Personal history of nicotine dependence: Secondary | ICD-10-CM

## 2020-09-14 DIAGNOSIS — Z122 Encounter for screening for malignant neoplasm of respiratory organs: Secondary | ICD-10-CM

## 2020-09-14 LAB — URINALYSIS, COMPLETE
Bilirubin, UA: NEGATIVE
Glucose, UA: NEGATIVE
Ketones, UA: NEGATIVE
Leukocytes,UA: NEGATIVE
Nitrite, UA: NEGATIVE
Protein,UA: NEGATIVE
RBC, UA: NEGATIVE
Specific Gravity, UA: 1.02 (ref 1.005–1.030)
Urobilinogen, Ur: 0.2 mg/dL (ref 0.2–1.0)
pH, UA: 5.5 (ref 5.0–7.5)

## 2020-09-14 LAB — MICROSCOPIC EXAMINATION: Bacteria, UA: NONE SEEN

## 2020-09-14 MED ORDER — TAMSULOSIN HCL 0.4 MG PO CAPS: 0.4000 mg | ORAL_CAPSULE | Freq: Every day | ORAL | 0 refills | Status: DC

## 2020-09-14 NOTE — Progress Notes (Signed)
Former smoker, quit 2009, 30 pack year

## 2020-09-14 NOTE — Progress Notes (Signed)
09/14/2020 9:16 AM   Adrian Hart 07-06-1951 [id number]  Referring provider: Kirk Ruths, MD [facility] [place],  Adrian Hart [id number]  Chief Complaint  Patient presents with  . Follow-up    Urologic history: 1. cT1cadenocarcinoma prostate low risk -Biopsy 08/2018; abnormal DRE; PSA 2.7; 66 g prostate -4/12 corespositive Gleason 3+3 (LLB,LLM, LM,LA) - 1-25% involvement -Nodules described at midline apex -Elected surveillance - Prostate MRI(12/18/18) w/2 PI-RADS 4 lesions left base and left mid gland concerning for high-grade carcinoma, no adenopathy, capsule intact, normal seminal vesicles - Fusion Biopsy:PI-RADS 4 lesions were benign, left side of biopsy positive forGleason 3+3   HPI: 69 y.o. male presents for follow-up of low risk prostate cancer on active surveillance.   Since last visit he has noted worsening lower urinary tract symptoms including urinary frequency, urgency, weak stream and some lower abdominal and scrotal discomfort  Denies dysuria, gross hematuria  PSA 09/12/2020 markedly elevated above baseline at 12.6  PMH: Past Medical History:  Diagnosis Date  . GERD (gastroesophageal reflux disease)    PT TAKES APPLE CIDER VINEGAR   . Hypertension    borderline pressures.  takes no meds  . Prostate cancer [facility])     needs no treatment. still on observation    Surgical History: Past Surgical History:  Procedure Laterality Date  . COLONOSCOPY  2003   Dr Alveta Heimlich  . COLONOSCOPY WITH PROPOFOL N/A 05/02/2018   Procedure: COLONOSCOPY WITH PROPOFOL;  Surgeon: Manya Silvas, MD;  Location: [facility] ENDOSCOPY;  Service: Endoscopy;  Laterality: N/A;  . EYE SURGERY Right    cataract  . FLEXIBLE SIGMOIDOSCOPY  1996   Dr Bary Castilla  . HEMORRHOID SURGERY  04-20-04   Dr Bary Castilla  . HERNIA REPAIR  [id number]   Umbilical, primary repair  .  KNEE SURGERY Left 1995   arthroscopy  . PARTIAL KNEE ARTHROPLASTY Right 09/05/2017   Procedure: UNICOMPARTMENTAL KNEE;  Surgeon: Corky Mull, MD;  Location: [facility] ORS;  Service: Orthopedics;  Laterality: Right;  . PARTIAL KNEE ARTHROPLASTY Left 09/01/2019   Procedure: UNICOMPARTMENTAL KNEE;  Surgeon: Corky Mull, MD;  Location: [facility] ORS;  Service: Orthopedics;  Laterality: Left;  . TONSILLECTOMY    . UMBILICAL HERNIA REPAIR N/A 09/27/2015   Procedure: HERNIA REPAIR UMBILICAL ADULT;  Surgeon: Robert Bellow, MD;  Location: [facility] ORS;  Service: General;  Laterality: N/A;  . XI ROBOTIC ASSISTED INGUINAL HERNIA REPAIR WITH MESH Left 07/28/2020   Procedure: XI ROBOTIC ASSISTED INGUINAL HERNIA REPAIR WITH MESH;  Surgeon: Jules Husbands, MD;  Location: [facility] ORS;  Service: General;  Laterality: Left;    Home Medications:  Allergies as of 09/14/2020      Reactions   Fish-derived Products Nausea And Vomiting   Reaction to mussells only      Medication List       Accurate as of September 14, 2020  9:16 AM. If you have any questions, ask your nurse or doctor.        fluticasone 50 MCG/ACT nasal spray Commonly known as: FLONASE Place 2 sprays into both nostrils daily as needed for allergies.   Ginseng 100 MG Caps Take 100 mg by mouth daily.   HYDROcodone-acetaminophen 5-325 MG tablet Commonly known as: NORCO/VICODIN Take 1-2 tablets by mouth every 6 (six) hours as needed for moderate pain.   Magnesium 250 MG Tabs Take 250 mg by mouth daily.   meloxicam 15 MG tablet Commonly known as: MOBIC Take 15 mg  by mouth daily as needed for pain.   Multi-Vitamins Tabs Take 1 tablet by mouth daily.   naproxen sodium 220 MG tablet Commonly known as: ALEVE Take 220 mg by mouth daily as needed.   PSYLLIUM PO Take 3 capsules by mouth 2 (two) times daily.   SAW PALMETTO PO Take 3 capsules by mouth daily.   Super B-Complex Caps Take 0.5 capsules by mouth daily.   tadalafil 20 MG  tablet Commonly known as: CIALIS TAKE ONE TABLET BY MOUTH DAILY AS NEEDED FOR ERECTILE DYSFUNCTION   traZODone 50 MG tablet Commonly known as: DESYREL Take 25 mg by mouth at bedtime as needed for sleep.       Allergies:  Allergies  Allergen Reactions  . Fish-Derived Products Nausea And Vomiting    Reaction to mussells only    Family History: Family History  Problem Relation Age of Onset  . Diabetes Mother   . Colon cancer Maternal Aunt   . Prostate cancer Maternal Uncle   . Bladder Cancer Neg Hx   . Kidney cancer Neg Hx     Social History:  reports that he quit smoking about 12 years ago. His smoking use included cigarettes. He has a 30.00 pack-year smoking history. He has never used smokeless tobacco. He reports that he does not drink alcohol and does not use drugs.   Physical Exam: BP (!) 171/80 (BP Location: Left Arm, Patient Position: Sitting, Cuff Size: Normal)   Pulse (!) 101   Ht 5\' 11"  (1.803 m)   Wt 196 lb 6.4 oz (89.1 kg)   BMI 27.39 kg/m   Constitutional:  Alert and oriented, No acute distress. HEENT: [place] AT, moist mucus membranes.  Trachea midline, no masses. Cardiovascular: No clubbing, cyanosis, or edema. Respiratory: Normal respiratory effort, no increased work of breathing. GI: Abdomen is soft, nontender, nondistended, no abdominal masses GU: Prostate 50 g, smooth without nodules Skin: No rashes, bruises or suspicious lesions. Neurologic: Grossly intact, no focal deficits, moving all 4 extremities. Psychiatric: Normal mood and affect.   Assessment & Plan:    1.  T1c low risk prostate cancer  Marked PSA bump  Urinalysis today unremarkable  Has noted worsening voiding symptoms  Rx tamsulosin 0.4 mg daily  Repeat PSA ~4 weeks  Repeat biopsy if PSA remains significantly elevated above baseline   Adrian Sons, MD  [place] [address] [place], [place] [address] [phone]

## 2020-09-19 DIAGNOSIS — D0439 Carcinoma in situ of skin of other parts of face: Secondary | ICD-10-CM | POA: Diagnosis not present

## 2020-09-22 ENCOUNTER — Ambulatory Visit
Admission: RE | Admit: 2020-09-22 | Discharge: 2020-09-22 | Disposition: A | Discharge status: Home / Self Care | Payer: PPO | Source: Ambulatory Visit | Attending: Nurse Practitioner | Admitting: Nurse Practitioner

## 2020-09-22 ENCOUNTER — Other Ambulatory Visit: Payer: Self-pay

## 2020-09-22 DIAGNOSIS — Z122 Encounter for screening for malignant neoplasm of respiratory organs: Secondary | ICD-10-CM | POA: Insufficient documentation

## 2020-09-22 DIAGNOSIS — Z87891 Personal history of nicotine dependence: Secondary | ICD-10-CM | POA: Diagnosis not present

## 2020-09-26 ENCOUNTER — Telehealth: Payer: Self-pay

## 2020-09-26 NOTE — Telephone Encounter (Signed)
Received called report for lung screening chest CT. Alease Medina NP notified.

## 2020-09-28 ENCOUNTER — Telehealth: Payer: Self-pay | Admitting: *Deleted

## 2020-09-28 NOTE — Telephone Encounter (Signed)
Contacted patient and reviewed results from lung screening scan with recommendation for evaluation by pulmonary or thoracic surgery. Recommendation from specialty providers is pending. Will call patient tomorrow after obtaining those recommendations.

## 2020-09-29 ENCOUNTER — Telehealth: Payer: Self-pay | Admitting: *Deleted

## 2020-09-29 NOTE — Telephone Encounter (Signed)
After review of CT results with pulmonary and thoracic surgery, patient is notified of results and recommendation is for treatment with antibiotics followed by CT scan in 8 weeks. Patient verbalizes understanding and agrees with the plan as described. PCP office will be notified of this recommendation and request that patient be prescribed 10 day course of antibiotics.   IMPRESSION: 1. New nodular area of architectural distortion in the inferior segment of the lingula. This is a very common location for area of chronic post infectious or inflammatory scarring, however, the possibility of developing neoplasm is difficult to entirely exclude and accordingly today's study is categorized as Lung-RADS 4BS, suspicious. Additional imaging evaluation or consultation with Pulmonology or Thoracic Surgery recommended. 2. The "S" modifier above refers to potentially clinically significant non lung cancer related findings. Specifically, there is aortic atherosclerosis, in addition to 2 vessel coronary artery disease. Please note that although the presence of coronary artery calcium documents the presence of coronary artery disease, the severity of this disease and any potential stenosis cannot be assessed on this non-gated CT examination. Assessment for potential risk factor modification, dietary therapy or pharmacologic therapy may be warranted, if clinically indicated. 3. Mild diffuse bronchial wall thickening with very mild centrilobular and paraseptal emphysema; imaging findings suggestive of underlying COPD.  These results will be called to the ordering clinician or representative by the Radiologist Assistant, and communication documented in the PACS or [employer].  Aortic Atherosclerosis (ICD10-I70.0) and Emphysema (ICD10-

## 2020-10-06 DIAGNOSIS — H2512 Age-related nuclear cataract, left eye: Secondary | ICD-10-CM | POA: Diagnosis not present

## 2020-10-13 ENCOUNTER — Telehealth: Payer: Self-pay | Admitting: *Deleted

## 2020-10-13 MED ORDER — TADALAFIL 20 MG PO TABS: 20.0000 mg | ORAL_TABLET | Freq: Every day | ORAL | 0 refills | Status: DC | PRN

## 2020-10-13 NOTE — Telephone Encounter (Signed)
rx sent to pharmacy by e-script  

## 2020-10-14 ENCOUNTER — Other Ambulatory Visit: Payer: Self-pay | Admitting: Urology

## 2020-10-19 ENCOUNTER — Other Ambulatory Visit: Payer: Self-pay

## 2020-10-19 ENCOUNTER — Other Ambulatory Visit: Payer: PPO

## 2020-10-19 DIAGNOSIS — C61 Malignant neoplasm of prostate: Secondary | ICD-10-CM | POA: Diagnosis not present

## 2020-10-20 ENCOUNTER — Telehealth: Payer: Self-pay | Admitting: *Deleted

## 2020-10-20 LAB — PSA: Prostate Specific Ag, Serum: 3.1 ng/mL (ref 0.0–4.0)

## 2020-10-20 NOTE — Telephone Encounter (Signed)
Notified patient as instructed, patient pleased. Discussed follow-up appointments, patient agrees  

## 2020-10-20 NOTE — Telephone Encounter (Signed)
-----   Message from Abbie Sons, MD sent at 10/20/2020 11:17 AM EST ----- Repeat PSA back to baseline at 3.1.  The rise to 12.6 last month most likely secondary to inflammation.  Please schedule follow-up visit with PSA prior April 2022

## 2020-11-08 DIAGNOSIS — J449 Chronic obstructive pulmonary disease, unspecified: Secondary | ICD-10-CM | POA: Diagnosis not present

## 2020-11-17 ENCOUNTER — Other Ambulatory Visit: Payer: Self-pay | Admitting: *Deleted

## 2020-11-17 DIAGNOSIS — R918 Other nonspecific abnormal finding of lung field: Secondary | ICD-10-CM

## 2020-11-17 DIAGNOSIS — Z87891 Personal history of nicotine dependence: Secondary | ICD-10-CM

## 2020-12-14 ENCOUNTER — Ambulatory Visit
Admission: RE | Admit: 2020-12-14 | Discharge: 2020-12-14 | Disposition: A | Discharge status: Home / Self Care | Payer: PPO | Source: Ambulatory Visit | Attending: Nurse Practitioner | Admitting: Nurse Practitioner

## 2020-12-14 ENCOUNTER — Other Ambulatory Visit: Payer: Self-pay

## 2020-12-14 DIAGNOSIS — Z87891 Personal history of nicotine dependence: Secondary | ICD-10-CM | POA: Insufficient documentation

## 2020-12-14 DIAGNOSIS — M47814 Spondylosis without myelopathy or radiculopathy, thoracic region: Secondary | ICD-10-CM | POA: Diagnosis not present

## 2020-12-14 DIAGNOSIS — J432 Centrilobular emphysema: Secondary | ICD-10-CM | POA: Diagnosis not present

## 2020-12-14 DIAGNOSIS — R918 Other nonspecific abnormal finding of lung field: Secondary | ICD-10-CM

## 2020-12-14 DIAGNOSIS — I2510 Atherosclerotic heart disease of native coronary artery without angina pectoris: Secondary | ICD-10-CM | POA: Diagnosis not present

## 2020-12-14 DIAGNOSIS — M954 Acquired deformity of chest and rib: Secondary | ICD-10-CM | POA: Diagnosis not present

## 2020-12-21 ENCOUNTER — Encounter: Payer: Self-pay | Admitting: *Deleted

## 2021-01-02 DIAGNOSIS — J449 Chronic obstructive pulmonary disease, unspecified: Secondary | ICD-10-CM | POA: Diagnosis not present

## 2021-01-02 DIAGNOSIS — I2510 Atherosclerotic heart disease of native coronary artery without angina pectoris: Secondary | ICD-10-CM | POA: Diagnosis not present

## 2021-01-02 DIAGNOSIS — R7309 Other abnormal glucose: Secondary | ICD-10-CM | POA: Diagnosis not present

## 2021-01-06 DIAGNOSIS — Z20822 Contact with and (suspected) exposure to covid-19: Secondary | ICD-10-CM | POA: Diagnosis not present

## 2021-01-09 DIAGNOSIS — I700 Atherosclerosis of aorta: Secondary | ICD-10-CM | POA: Insufficient documentation

## 2021-01-09 DIAGNOSIS — R7303 Prediabetes: Secondary | ICD-10-CM | POA: Diagnosis not present

## 2021-01-09 DIAGNOSIS — Z0000 Encounter for general adult medical examination without abnormal findings: Secondary | ICD-10-CM | POA: Diagnosis not present

## 2021-01-09 DIAGNOSIS — I2510 Atherosclerotic heart disease of native coronary artery without angina pectoris: Secondary | ICD-10-CM | POA: Diagnosis not present

## 2021-01-22 ENCOUNTER — Other Ambulatory Visit: Payer: Self-pay | Admitting: Urology

## 2021-01-24 DIAGNOSIS — D2262 Melanocytic nevi of left upper limb, including shoulder: Secondary | ICD-10-CM | POA: Diagnosis not present

## 2021-01-24 DIAGNOSIS — D2271 Melanocytic nevi of right lower limb, including hip: Secondary | ICD-10-CM | POA: Diagnosis not present

## 2021-01-24 DIAGNOSIS — D049 Carcinoma in situ of skin, unspecified: Secondary | ICD-10-CM | POA: Diagnosis not present

## 2021-01-24 DIAGNOSIS — Z85828 Personal history of other malignant neoplasm of skin: Secondary | ICD-10-CM | POA: Diagnosis not present

## 2021-01-24 DIAGNOSIS — D2261 Melanocytic nevi of right upper limb, including shoulder: Secondary | ICD-10-CM | POA: Diagnosis not present

## 2021-01-24 DIAGNOSIS — L821 Other seborrheic keratosis: Secondary | ICD-10-CM | POA: Diagnosis not present

## 2021-01-24 DIAGNOSIS — D225 Melanocytic nevi of trunk: Secondary | ICD-10-CM | POA: Diagnosis not present

## 2021-02-06 DIAGNOSIS — J42 Unspecified chronic bronchitis: Secondary | ICD-10-CM | POA: Diagnosis not present

## 2021-03-07 ENCOUNTER — Other Ambulatory Visit: Payer: Self-pay

## 2021-03-07 DIAGNOSIS — C61 Malignant neoplasm of prostate: Secondary | ICD-10-CM

## 2021-03-20 ENCOUNTER — Other Ambulatory Visit: Payer: Self-pay

## 2021-03-23 ENCOUNTER — Other Ambulatory Visit: Payer: PPO

## 2021-03-23 ENCOUNTER — Other Ambulatory Visit: Payer: Self-pay

## 2021-03-23 DIAGNOSIS — C61 Malignant neoplasm of prostate: Secondary | ICD-10-CM

## 2021-03-24 LAB — PSA: Prostate Specific Ag, Serum: 2.7 ng/mL (ref 0.0–4.0)

## 2021-03-27 ENCOUNTER — Telehealth: Payer: Self-pay | Admitting: *Deleted

## 2021-03-27 NOTE — Telephone Encounter (Signed)
-----   Message from Abbie Sons, MD sent at 03/26/2021 11:07 AM EDT ----- PSA stable at 2.7.  He needs an OV for DRE

## 2021-03-27 NOTE — Telephone Encounter (Signed)
Notified patient as instructed, patient pleased. Discussed follow-up appointments, patient agrees  

## 2021-04-20 ENCOUNTER — Ambulatory Visit: Payer: Self-pay | Admitting: Urology

## 2021-04-29 ENCOUNTER — Other Ambulatory Visit: Payer: Self-pay | Admitting: Urology

## 2021-05-15 ENCOUNTER — Other Ambulatory Visit: Payer: Self-pay | Admitting: Urology

## 2021-05-15 ENCOUNTER — Encounter: Payer: Self-pay | Admitting: Urology

## 2021-05-15 ENCOUNTER — Other Ambulatory Visit: Payer: Self-pay

## 2021-05-15 ENCOUNTER — Ambulatory Visit: Payer: PPO | Admitting: Urology

## 2021-05-15 VITALS — BP 126/81 | HR 8 | Ht 71.0 in | Wt 200.0 lb

## 2021-05-15 DIAGNOSIS — R972 Elevated prostate specific antigen [PSA]: Secondary | ICD-10-CM | POA: Diagnosis not present

## 2021-05-15 DIAGNOSIS — N5201 Erectile dysfunction due to arterial insufficiency: Secondary | ICD-10-CM

## 2021-05-15 DIAGNOSIS — N401 Enlarged prostate with lower urinary tract symptoms: Secondary | ICD-10-CM

## 2021-05-15 DIAGNOSIS — C61 Malignant neoplasm of prostate: Secondary | ICD-10-CM

## 2021-05-15 NOTE — Progress Notes (Signed)
05/15/2021 9:27 AM   Adrian Hart Apr 12, 1951 [id number]  Referring provider: Kirk Ruths, MD [facility] [place],  [place] [address]  Chief Complaint  Patient presents with   Erectile Dysfunction    Urologic history: 1. cT1c adenocarcinoma prostate low risk - Biopsy 08/2018; abnormal DRE; PSA 2.7; 66 g prostate - 4/12 cores positive Gleason 3+3 (LLB, LLM, LM, LA) - 1-25% involvement - Nodules described at midline apex - Elected surveillance - Prostate MRI (12/18/18) w/2 PI-RADS 4 lesions left base and left mid gland concerning for high-grade carcinoma, no adenopathy, capsule intact, normal seminal vesicles - Fusion Biopsy: PI-RADS 4 lesions were benign, left side of biopsy positive Gleason 3+3  2.  BPH with LUTS  3.  Erectile dysfunction - Tadalafil 20 mg prn   HPI: 70 y.o. male presents for follow-up.  Last visit October 2021 PSA had increased to 12.6 with a repeat PSA April 2022 back to baseline at 2.7 Stable lower urinary tract symptoms frequency, urgency and decreased stream Does not feel that tamsulosin has significantly improved his symptoms and he was inquiring about stopping the medication Voiding symptoms are not bothersome Denies dysuria, gross hematuria Denies flank, abdominal or pelvic pain   PMH: Past Medical History:  Diagnosis Date   GERD (gastroesophageal reflux disease)    PT TAKES APPLE CIDER VINEGAR    Hypertension    borderline pressures.  takes no meds   Prostate cancer [facility])     needs no treatment. still on observation    Surgical History: Past Surgical History:  Procedure Laterality Date   COLONOSCOPY  2003   Dr Alveta Heimlich   COLONOSCOPY WITH PROPOFOL N/A 05/02/2018   Procedure: COLONOSCOPY WITH PROPOFOL;  Surgeon: Manya Silvas, MD;  Location: [facility] ENDOSCOPY;  Service: Endoscopy;  Laterality: N/A;   EYE SURGERY Right    cataract   FLEXIBLE SIGMOIDOSCOPY  1996   Dr Bary Castilla   HEMORRHOID SURGERY   04-20-04   Dr Bary Castilla   HERNIA REPAIR  51/88/[id number]   Umbilical, primary repair   KNEE SURGERY Left 1995   arthroscopy   PARTIAL KNEE ARTHROPLASTY Right 09/05/2017   Procedure: UNICOMPARTMENTAL KNEE;  Surgeon: Corky Mull, MD;  Location: [facility] ORS;  Service: Orthopedics;  Laterality: Right;   PARTIAL KNEE ARTHROPLASTY Left 09/01/2019   Procedure: UNICOMPARTMENTAL KNEE;  Surgeon: Corky Mull, MD;  Location: [facility] ORS;  Service: Orthopedics;  Laterality: Left;   TONSILLECTOMY     UMBILICAL HERNIA REPAIR N/A 09/27/2015   Procedure: HERNIA REPAIR UMBILICAL ADULT;  Surgeon: Robert Bellow, MD;  Location: [facility] ORS;  Service: General;  Laterality: N/A;   XI ROBOTIC ASSISTED INGUINAL HERNIA REPAIR WITH MESH Left 07/28/2020   Procedure: XI ROBOTIC ASSISTED INGUINAL HERNIA REPAIR WITH MESH;  Surgeon: Jules Husbands, MD;  Location: [facility] ORS;  Service: General;  Laterality: Left;    Home Medications:  Allergies as of 05/15/2021       Reactions   Fish-derived Products Nausea And Vomiting   Reaction to mussells only        Medication List        Accurate as of May 15, 2021  9:27 AM. If you have any questions, ask your nurse or doctor.          fluticasone 50 MCG/ACT nasal spray Commonly known as: FLONASE Place 2 sprays into both nostrils daily as needed for allergies.   Ginseng 100 MG Caps Take 100 mg by mouth  daily.   HYDROcodone-acetaminophen 5-325 MG tablet Commonly known as: NORCO/VICODIN Take 1-2 tablets by mouth every 6 (six) hours as needed for moderate pain.   Magnesium 250 MG Tabs Take 250 mg by mouth daily.   meloxicam 15 MG tablet Commonly known as: MOBIC Take 15 mg by mouth daily as needed for pain.   Multi-Vitamins Tabs Take 1 tablet by mouth daily.   naproxen sodium 220 MG tablet Commonly known as: ALEVE Take 220 mg by mouth daily as needed.   PSYLLIUM PO Take 3 capsules by mouth 2 (two) times daily.   SAW PALMETTO PO Take 3 capsules by mouth daily.    Super B-Complex Caps Take 0.5 capsules by mouth daily.   tadalafil 20 MG tablet Commonly known as: CIALIS Take 1 tablet (20 mg total) by mouth daily as needed for erectile dysfunction.   tamsulosin 0.4 MG Caps capsule Commonly known as: FLOMAX TAKE 1 CAPSULE BY MOUTH EVERY DAY   traZODone 50 MG tablet Commonly known as: DESYREL Take 25 mg by mouth at bedtime as needed for sleep.        Allergies:  Allergies  Allergen Reactions   Fish-Derived Products Nausea And Vomiting    Reaction to mussells only    Family History: Family History  Problem Relation Age of Onset   Diabetes Mother    Colon cancer Maternal Aunt    Prostate cancer Maternal Uncle    Bladder Cancer Neg Hx    Kidney cancer Neg Hx     Social History:  reports that he quit smoking about 13 years ago. His smoking use included cigarettes. He has a 30.00 pack-year smoking history. He has never used smokeless tobacco. He reports that he does not drink alcohol and does not use drugs.   Physical Exam: BP 126/81   Pulse (!) 8   Ht 5\' 11"  (1.803 m)   Wt 200 lb (90.7 kg)   BMI 27.89 kg/m   Constitutional:  Alert and oriented, No acute distress. HEENT: [place] AT, moist mucus membranes.  Trachea midline, no masses. Cardiovascular: No clubbing, cyanosis, or edema. Respiratory: Normal respiratory effort, no increased work of breathing. GI: Abdomen is soft, nontender, nondistended, no abdominal masses GU: Prostate 50 g, smooth without nodules Neurologic: Grossly intact, no focal deficits, moving all 4 extremities. Psychiatric: Normal mood and affect.   Assessment & Plan:    1.  T1c low risk prostate cancer Desires to continue active surveillance PSA drawn today and if stable follow-up lab visit 6 months for PSA and 1 year office visit for PSA/DRE  2.  BPH with LUTS Does not feel tamsulosin has significantly improved his symptoms and he states his symptoms are not bothersome Okay to discontinue and he may  restart if he notes worsening voiding symptoms after stopping  3.  Erectile dysfunction Stable on tadalafil   Abbie Sons, MD  [place] [address] [place], [place] [address] [phone]

## 2021-05-16 ENCOUNTER — Encounter: Payer: Self-pay | Admitting: Urology

## 2021-05-16 LAB — PSA: Prostate Specific Ag, Serum: 2.5 ng/mL (ref 0.0–4.0)

## 2021-05-23 DIAGNOSIS — D485 Neoplasm of uncertain behavior of skin: Secondary | ICD-10-CM | POA: Diagnosis not present

## 2021-07-06 ENCOUNTER — Other Ambulatory Visit
Admission: RE | Admit: 2021-07-06 | Discharge: 2021-07-06 | Disposition: A | Discharge status: Home / Self Care | Payer: PPO | Source: Ambulatory Visit | Attending: Student | Admitting: Student

## 2021-07-06 DIAGNOSIS — Z96651 Presence of right artificial knee joint: Secondary | ICD-10-CM | POA: Insufficient documentation

## 2021-07-06 DIAGNOSIS — M25461 Effusion, right knee: Secondary | ICD-10-CM | POA: Insufficient documentation

## 2021-07-07 LAB — SYNOVIAL CELL COUNT + DIFF, W/ CRYSTALS
Crystals, Fluid: NONE SEEN
Eosinophils-Synovial: 1 %
Lymphocytes-Synovial Fld: 32 %
Monocyte-Macrophage-Synovial Fluid: 26 %
Neutrophil, Synovial: 41 %
Other Cells-SYN: 0
WBC, Synovial: 371 /mm3 — ABNORMAL HIGH (ref 0–200)

## 2021-07-10 DIAGNOSIS — I700 Atherosclerosis of aorta: Secondary | ICD-10-CM | POA: Diagnosis not present

## 2021-07-10 DIAGNOSIS — R7303 Prediabetes: Secondary | ICD-10-CM | POA: Diagnosis not present

## 2021-07-10 DIAGNOSIS — I2510 Atherosclerotic heart disease of native coronary artery without angina pectoris: Secondary | ICD-10-CM | POA: Diagnosis not present

## 2021-07-10 DIAGNOSIS — M25561 Pain in right knee: Secondary | ICD-10-CM | POA: Diagnosis not present

## 2021-07-12 ENCOUNTER — Other Ambulatory Visit: Payer: Self-pay | Admitting: Student

## 2021-07-12 DIAGNOSIS — M25461 Effusion, right knee: Secondary | ICD-10-CM

## 2021-07-12 DIAGNOSIS — Z96651 Presence of right artificial knee joint: Secondary | ICD-10-CM

## 2021-07-14 ENCOUNTER — Ambulatory Visit: Payer: PPO

## 2021-08-09 DIAGNOSIS — Z01818 Encounter for other preprocedural examination: Secondary | ICD-10-CM | POA: Diagnosis not present

## 2021-08-09 DIAGNOSIS — J42 Unspecified chronic bronchitis: Secondary | ICD-10-CM | POA: Diagnosis not present

## 2021-10-12 DIAGNOSIS — H2512 Age-related nuclear cataract, left eye: Secondary | ICD-10-CM | POA: Diagnosis not present

## 2021-11-01 DIAGNOSIS — J069 Acute upper respiratory infection, unspecified: Secondary | ICD-10-CM | POA: Diagnosis not present

## 2021-11-14 ENCOUNTER — Other Ambulatory Visit: Payer: Self-pay

## 2021-11-14 ENCOUNTER — Other Ambulatory Visit: Payer: PPO

## 2021-11-14 DIAGNOSIS — C61 Malignant neoplasm of prostate: Secondary | ICD-10-CM

## 2021-11-16 LAB — PSA: Prostate Specific Ag, Serum: 4.7 ng/mL — ABNORMAL HIGH (ref 0.0–4.0)

## 2021-11-17 ENCOUNTER — Encounter: Payer: Self-pay | Admitting: *Deleted

## 2022-01-10 DIAGNOSIS — I2510 Atherosclerotic heart disease of native coronary artery without angina pectoris: Secondary | ICD-10-CM | POA: Diagnosis not present

## 2022-01-10 DIAGNOSIS — R058 Other specified cough: Secondary | ICD-10-CM | POA: Diagnosis not present

## 2022-01-10 DIAGNOSIS — I700 Atherosclerosis of aorta: Secondary | ICD-10-CM | POA: Diagnosis not present

## 2022-01-10 DIAGNOSIS — Z0000 Encounter for general adult medical examination without abnormal findings: Secondary | ICD-10-CM | POA: Diagnosis not present

## 2022-01-10 DIAGNOSIS — R7303 Prediabetes: Secondary | ICD-10-CM | POA: Diagnosis not present

## 2022-01-10 DIAGNOSIS — R400 Somnolence: Secondary | ICD-10-CM | POA: Diagnosis not present

## 2022-01-10 DIAGNOSIS — Z1389 Encounter for screening for other disorder: Secondary | ICD-10-CM | POA: Diagnosis not present

## 2022-01-10 DIAGNOSIS — J449 Chronic obstructive pulmonary disease, unspecified: Secondary | ICD-10-CM | POA: Diagnosis not present

## 2022-01-12 DIAGNOSIS — R058 Other specified cough: Secondary | ICD-10-CM | POA: Diagnosis not present

## 2022-01-12 DIAGNOSIS — Z125 Encounter for screening for malignant neoplasm of prostate: Secondary | ICD-10-CM | POA: Diagnosis not present

## 2022-01-12 DIAGNOSIS — Z0000 Encounter for general adult medical examination without abnormal findings: Secondary | ICD-10-CM | POA: Diagnosis not present

## 2022-01-12 DIAGNOSIS — I700 Atherosclerosis of aorta: Secondary | ICD-10-CM | POA: Diagnosis not present

## 2022-01-12 DIAGNOSIS — I2510 Atherosclerotic heart disease of native coronary artery without angina pectoris: Secondary | ICD-10-CM | POA: Diagnosis not present

## 2022-01-12 DIAGNOSIS — R7303 Prediabetes: Secondary | ICD-10-CM | POA: Diagnosis not present

## 2022-01-12 DIAGNOSIS — R400 Somnolence: Secondary | ICD-10-CM | POA: Diagnosis not present

## 2022-01-12 DIAGNOSIS — Z1389 Encounter for screening for other disorder: Secondary | ICD-10-CM | POA: Diagnosis not present

## 2022-01-24 DIAGNOSIS — C61 Malignant neoplasm of prostate: Secondary | ICD-10-CM | POA: Diagnosis not present

## 2022-01-24 DIAGNOSIS — G4700 Insomnia, unspecified: Secondary | ICD-10-CM | POA: Diagnosis not present

## 2022-01-24 DIAGNOSIS — I739 Peripheral vascular disease, unspecified: Secondary | ICD-10-CM | POA: Diagnosis not present

## 2022-01-24 DIAGNOSIS — E785 Hyperlipidemia, unspecified: Secondary | ICD-10-CM | POA: Diagnosis not present

## 2022-01-26 DIAGNOSIS — L309 Dermatitis, unspecified: Secondary | ICD-10-CM | POA: Diagnosis not present

## 2022-01-26 DIAGNOSIS — L723 Sebaceous cyst: Secondary | ICD-10-CM | POA: Diagnosis not present

## 2022-01-26 DIAGNOSIS — Z85828 Personal history of other malignant neoplasm of skin: Secondary | ICD-10-CM | POA: Diagnosis not present

## 2022-01-26 DIAGNOSIS — D225 Melanocytic nevi of trunk: Secondary | ICD-10-CM | POA: Diagnosis not present

## 2022-01-26 DIAGNOSIS — L7211 Pilar cyst: Secondary | ICD-10-CM | POA: Diagnosis not present

## 2022-01-29 DIAGNOSIS — L298 Other pruritus: Secondary | ICD-10-CM | POA: Diagnosis not present

## 2022-01-29 DIAGNOSIS — L723 Sebaceous cyst: Secondary | ICD-10-CM | POA: Diagnosis not present

## 2022-01-29 DIAGNOSIS — L720 Epidermal cyst: Secondary | ICD-10-CM | POA: Diagnosis not present

## 2022-01-29 DIAGNOSIS — L7211 Pilar cyst: Secondary | ICD-10-CM | POA: Diagnosis not present

## 2022-01-29 DIAGNOSIS — L538 Other specified erythematous conditions: Secondary | ICD-10-CM | POA: Diagnosis not present

## 2022-01-30 ENCOUNTER — Other Ambulatory Visit: Payer: Self-pay

## 2022-01-30 DIAGNOSIS — Z87891 Personal history of nicotine dependence: Secondary | ICD-10-CM

## 2022-02-07 ENCOUNTER — Other Ambulatory Visit: Payer: Self-pay

## 2022-02-07 ENCOUNTER — Ambulatory Visit
Admission: RE | Admit: 2022-02-07 | Discharge: 2022-02-07 | Disposition: A | Discharge status: Home / Self Care | Payer: PPO | Source: Ambulatory Visit | Attending: Acute Care | Admitting: Acute Care

## 2022-02-07 DIAGNOSIS — Z87891 Personal history of nicotine dependence: Secondary | ICD-10-CM | POA: Diagnosis not present

## 2022-02-08 ENCOUNTER — Other Ambulatory Visit: Payer: Self-pay | Admitting: Acute Care

## 2022-02-08 DIAGNOSIS — Z87891 Personal history of nicotine dependence: Secondary | ICD-10-CM

## 2022-02-12 DIAGNOSIS — G4710 Hypersomnia, unspecified: Secondary | ICD-10-CM | POA: Diagnosis not present

## 2022-02-12 DIAGNOSIS — R400 Somnolence: Secondary | ICD-10-CM | POA: Diagnosis not present

## 2022-02-18 DIAGNOSIS — G4733 Obstructive sleep apnea (adult) (pediatric): Secondary | ICD-10-CM | POA: Diagnosis not present

## 2022-03-30 DIAGNOSIS — F411 Generalized anxiety disorder: Secondary | ICD-10-CM | POA: Diagnosis not present

## 2022-03-30 DIAGNOSIS — I700 Atherosclerosis of aorta: Secondary | ICD-10-CM | POA: Diagnosis not present

## 2022-03-30 DIAGNOSIS — I2510 Atherosclerotic heart disease of native coronary artery without angina pectoris: Secondary | ICD-10-CM | POA: Diagnosis not present

## 2022-03-30 DIAGNOSIS — R7303 Prediabetes: Secondary | ICD-10-CM | POA: Diagnosis not present

## 2022-05-14 ENCOUNTER — Other Ambulatory Visit: Payer: Self-pay

## 2022-05-14 DIAGNOSIS — C61 Malignant neoplasm of prostate: Secondary | ICD-10-CM

## 2022-05-15 ENCOUNTER — Other Ambulatory Visit: Payer: PPO

## 2022-05-15 ENCOUNTER — Other Ambulatory Visit: Payer: Self-pay

## 2022-05-15 ENCOUNTER — Encounter: Payer: Self-pay | Admitting: Urology

## 2022-05-15 DIAGNOSIS — C61 Malignant neoplasm of prostate: Secondary | ICD-10-CM | POA: Diagnosis not present

## 2022-05-16 LAB — PSA: Prostate Specific Ag, Serum: 2.5 ng/mL (ref 0.0–4.0)

## 2022-05-18 ENCOUNTER — Ambulatory Visit: Payer: PPO | Admitting: Urology

## 2022-05-18 ENCOUNTER — Encounter: Payer: Self-pay | Admitting: Urology

## 2022-05-18 VITALS — BP 164/89 | HR 81 | Ht 70.87 in | Wt 195.8 lb

## 2022-05-18 DIAGNOSIS — Z8546 Personal history of malignant neoplasm of prostate: Secondary | ICD-10-CM | POA: Diagnosis not present

## 2022-05-18 DIAGNOSIS — N401 Enlarged prostate with lower urinary tract symptoms: Secondary | ICD-10-CM | POA: Diagnosis not present

## 2022-05-18 DIAGNOSIS — N529 Male erectile dysfunction, unspecified: Secondary | ICD-10-CM | POA: Diagnosis not present

## 2022-05-18 DIAGNOSIS — C61 Malignant neoplasm of prostate: Secondary | ICD-10-CM

## 2022-05-18 LAB — URINALYSIS, COMPLETE
Bilirubin, UA: NEGATIVE
Glucose, UA: NEGATIVE
Ketones, UA: NEGATIVE
Leukocytes,UA: NEGATIVE
Nitrite, UA: NEGATIVE
Protein,UA: NEGATIVE
Specific Gravity, UA: 1.025 (ref 1.005–1.030)
Urobilinogen, Ur: 0.2 mg/dL (ref 0.2–1.0)
pH, UA: 6 (ref 5.0–7.5)

## 2022-05-18 LAB — MICROSCOPIC EXAMINATION: Bacteria, UA: NONE SEEN

## 2022-05-18 NOTE — Progress Notes (Signed)
05/18/2022 10:22 AM   Adrian Hart December 11, 1950 [id number]  Referring provider: Kirk Ruths, MD [place] [facility] [place],  [place] [address]  Chief Complaint  Patient presents with   Prostate Cancer    Urologic history: 1. cT1c adenocarcinoma prostate low risk - Biopsy 08/2018; abnormal DRE; PSA 2.7; 66 g prostate - 4/12 cores positive Gleason 3+3 (LLB, LLM, LM, LA) - 1-25% involvement - Elected surveillance - Prostate MRI (12/18/18) w/2 PI-RADS 4 lesions left base and left mid gland concerning for high-grade carcinoma, no adenopathy, capsule intact, normal seminal vesicles - Fusion Biopsy 01/2019: PI-RADS 4 lesions were benign, left side of biopsy positive Gleason 3+3  2.  BPH with LUTS  3.  Erectile dysfunction - Tadalafil 20 mg prn   HPI: 71 y.o. male presents for follow-up.  Discontinued tamsulosin at last visit Primary voiding complaint is slow urinary stream PSA stable at 2.5 on 05/15/2022 States recently his urine has "looked weird" and has an occasional odor No gross hematuria   PMH: Past Medical History:  Diagnosis Date   GERD (gastroesophageal reflux disease)    PT TAKES APPLE CIDER VINEGAR    Hypertension    borderline pressures.  takes no meds   Prostate cancer [facility])     needs no treatment. still on observation    Surgical History: Past Surgical History:  Procedure Laterality Date   COLONOSCOPY  2003   Dr Alveta Heimlich   COLONOSCOPY WITH PROPOFOL N/A 05/02/2018   Procedure: COLONOSCOPY WITH PROPOFOL;  Surgeon: Manya Silvas, MD;  Location: [facility] ENDOSCOPY;  Service: Endoscopy;  Laterality: N/A;   EYE SURGERY Right    cataract   FLEXIBLE SIGMOIDOSCOPY  1996   Dr Bary Castilla   HEMORRHOID SURGERY  04-20-04   Dr Bary Castilla   HERNIA REPAIR  44/12/270   Umbilical, primary repair   KNEE SURGERY Left 1995   arthroscopy   PARTIAL KNEE ARTHROPLASTY Right 09/05/2017   Procedure: UNICOMPARTMENTAL KNEE;  Surgeon: Corky Mull, MD;   Location: [facility] ORS;  Service: Orthopedics;  Laterality: Right;   PARTIAL KNEE ARTHROPLASTY Left 09/01/2019   Procedure: UNICOMPARTMENTAL KNEE;  Surgeon: Corky Mull, MD;  Location: [facility] ORS;  Service: Orthopedics;  Laterality: Left;   TONSILLECTOMY     UMBILICAL HERNIA REPAIR N/A 09/27/2015   Procedure: HERNIA REPAIR UMBILICAL ADULT;  Surgeon: Robert Bellow, MD;  Location: [facility] ORS;  Service: General;  Laterality: N/A;   XI ROBOTIC ASSISTED INGUINAL HERNIA REPAIR WITH MESH Left 07/28/2020   Procedure: XI ROBOTIC ASSISTED INGUINAL HERNIA REPAIR WITH MESH;  Surgeon: Jules Husbands, MD;  Location: [facility] ORS;  Service: General;  Laterality: Left;    Home Medications:  Allergies as of 05/18/2022       Reactions   Fish-derived Products Nausea And Vomiting   Reaction to mussells only        Medication List        Accurate as of May 18, 2022 10:22 AM. If you have any questions, ask your nurse or doctor.          STOP taking these medications    fluticasone 50 MCG/ACT nasal spray Commonly known as: FLONASE Stopped by: Abbie Sons, MD   HYDROcodone-acetaminophen 5-325 MG tablet Commonly known as: NORCO/VICODIN Stopped by: Abbie Sons, MD   Magnesium 250 MG Tabs Stopped by: Abbie Sons, MD   Super B-Complex Caps Stopped by: Abbie Sons, MD  TAKE these medications    escitalopram 20 MG tablet Commonly known as: LEXAPRO Take 20 mg by mouth daily.   Ginseng 100 MG Caps Take 100 mg by mouth daily.   meloxicam 15 MG tablet Commonly known as: MOBIC Take 15 mg by mouth daily as needed for pain.   Multi-Vitamins Tabs Take 1 tablet by mouth daily.   naproxen sodium 220 MG tablet Commonly known as: ALEVE Take 220 mg by mouth daily as needed.   PSYLLIUM PO Take 3 capsules by mouth 2 (two) times daily.   rosuvastatin 10 MG tablet Commonly known as: CRESTOR Take 10 mg by mouth daily.   SAW PALMETTO PO Take 3 capsules by mouth daily.    tadalafil 20 MG tablet Commonly known as: CIALIS Take 1 tablet (20 mg total) by mouth daily as needed for erectile dysfunction.   tamsulosin 0.4 MG Caps capsule Commonly known as: FLOMAX TAKE 1 CAPSULE BY MOUTH EVERY DAY   traZODone 50 MG tablet Commonly known as: DESYREL Take 25 mg by mouth at bedtime as needed for sleep.        Allergies:  Allergies  Allergen Reactions   Fish-Derived Products Nausea And Vomiting    Reaction to mussells only    Family History: Family History  Problem Relation Age of Onset   Diabetes Mother    Colon cancer Maternal Aunt    Prostate cancer Maternal Uncle    Bladder Cancer Neg Hx    Kidney cancer Neg Hx     Social History:  reports that he quit smoking about 14 years ago. His smoking use included cigarettes. He has a 30.00 pack-year smoking history. He has never used smokeless tobacco. He reports that he does not drink alcohol and does not use drugs.   Physical Exam: BP (!) 164/89 (BP Location: Left Arm, Patient Position: Sitting, Cuff Size: Normal)   Pulse 81   Ht 5' 10.87" (1.8 m)   Wt 195 lb 12.8 oz (88.8 kg)   BMI 27.41 kg/m   Constitutional:  Alert and oriented, No acute distress. HEENT: [place] AT Respiratory: Normal respiratory effort, no increased work of breathing. Neurologic: Grossly intact, no focal deficits, moving all 4 extremities. Psychiatric: Normal mood and affect.   Assessment & Plan:    1.  T1c low risk prostate cancer Desires to continue active surveillance 6 month follow-up with PSA/DRE  2.  BPH with LUTS Primary complaint is slow urinary stream He indicated he will restart tamsulosin-currently does not need a refill but will call for refill if he continues UA today  3.  Erectile dysfunction Stable on tadalafil   Abbie Sons, MD  [place] [address], [place], [place] [address] [phone]

## 2022-06-06 DIAGNOSIS — F411 Generalized anxiety disorder: Secondary | ICD-10-CM | POA: Diagnosis not present

## 2022-06-06 DIAGNOSIS — I700 Atherosclerosis of aorta: Secondary | ICD-10-CM | POA: Diagnosis not present

## 2022-06-06 DIAGNOSIS — R7303 Prediabetes: Secondary | ICD-10-CM | POA: Diagnosis not present

## 2022-06-06 DIAGNOSIS — I2510 Atherosclerotic heart disease of native coronary artery without angina pectoris: Secondary | ICD-10-CM | POA: Diagnosis not present

## 2022-07-17 DIAGNOSIS — I2510 Atherosclerotic heart disease of native coronary artery without angina pectoris: Secondary | ICD-10-CM | POA: Diagnosis not present

## 2022-07-17 DIAGNOSIS — R7303 Prediabetes: Secondary | ICD-10-CM | POA: Diagnosis not present

## 2022-07-17 DIAGNOSIS — I700 Atherosclerosis of aorta: Secondary | ICD-10-CM | POA: Diagnosis not present

## 2022-07-17 DIAGNOSIS — F411 Generalized anxiety disorder: Secondary | ICD-10-CM | POA: Diagnosis not present

## 2022-08-02 DIAGNOSIS — M25511 Pain in right shoulder: Secondary | ICD-10-CM | POA: Diagnosis not present

## 2022-08-02 DIAGNOSIS — M7521 Bicipital tendinitis, right shoulder: Secondary | ICD-10-CM | POA: Diagnosis not present

## 2022-08-02 DIAGNOSIS — M7551 Bursitis of right shoulder: Secondary | ICD-10-CM | POA: Diagnosis not present

## 2022-08-23 DIAGNOSIS — G5791 Unspecified mononeuropathy of right lower limb: Secondary | ICD-10-CM | POA: Diagnosis not present

## 2022-08-23 DIAGNOSIS — M519 Unspecified thoracic, thoracolumbar and lumbosacral intervertebral disc disorder: Secondary | ICD-10-CM | POA: Diagnosis not present

## 2022-08-23 DIAGNOSIS — Z8781 Personal history of (healed) traumatic fracture: Secondary | ICD-10-CM | POA: Diagnosis not present

## 2022-08-23 DIAGNOSIS — Z8719 Personal history of other diseases of the digestive system: Secondary | ICD-10-CM | POA: Diagnosis not present

## 2022-08-23 DIAGNOSIS — E785 Hyperlipidemia, unspecified: Secondary | ICD-10-CM | POA: Diagnosis not present

## 2022-08-29 ENCOUNTER — Other Ambulatory Visit
Admission: RE | Admit: 2022-08-29 | Discharge: 2022-08-29 | Disposition: A | Discharge status: Home / Self Care | Payer: PPO | Source: Ambulatory Visit | Attending: Student | Admitting: Student

## 2022-08-29 DIAGNOSIS — M25461 Effusion, right knee: Secondary | ICD-10-CM | POA: Insufficient documentation

## 2022-08-29 DIAGNOSIS — Z96651 Presence of right artificial knee joint: Secondary | ICD-10-CM | POA: Diagnosis not present

## 2022-08-29 DIAGNOSIS — M25561 Pain in right knee: Secondary | ICD-10-CM | POA: Diagnosis not present

## 2022-08-29 LAB — SYNOVIAL CELL COUNT + DIFF, W/ CRYSTALS
Crystals, Fluid: NONE SEEN
Eosinophils-Synovial: 1 %
Lymphocytes-Synovial Fld: 11 %
Monocyte-Macrophage-Synovial Fluid: 84 %
Neutrophil, Synovial: 3 %
Other Cells-SYN: 1
WBC, Synovial: 2932 /mm3 — ABNORMAL HIGH (ref 0–200)

## 2022-08-31 ENCOUNTER — Other Ambulatory Visit: Payer: Self-pay | Admitting: Student

## 2022-08-31 DIAGNOSIS — M25561 Pain in right knee: Secondary | ICD-10-CM

## 2022-08-31 DIAGNOSIS — Z96651 Presence of right artificial knee joint: Secondary | ICD-10-CM

## 2022-09-06 DIAGNOSIS — K219 Gastro-esophageal reflux disease without esophagitis: Secondary | ICD-10-CM | POA: Diagnosis not present

## 2022-09-06 DIAGNOSIS — D126 Benign neoplasm of colon, unspecified: Secondary | ICD-10-CM | POA: Insufficient documentation

## 2022-09-06 DIAGNOSIS — R7303 Prediabetes: Secondary | ICD-10-CM | POA: Diagnosis not present

## 2022-09-06 DIAGNOSIS — I2510 Atherosclerotic heart disease of native coronary artery without angina pectoris: Secondary | ICD-10-CM | POA: Diagnosis not present

## 2022-09-14 ENCOUNTER — Ambulatory Visit
Admission: RE | Admit: 2022-09-14 | Discharge: 2022-09-14 | Disposition: A | Discharge status: Home / Self Care | Payer: PPO | Source: Ambulatory Visit | Attending: Student | Admitting: Student

## 2022-09-14 DIAGNOSIS — M25561 Pain in right knee: Secondary | ICD-10-CM

## 2022-09-14 DIAGNOSIS — Z96651 Presence of right artificial knee joint: Secondary | ICD-10-CM

## 2022-10-01 DIAGNOSIS — Z96651 Presence of right artificial knee joint: Secondary | ICD-10-CM | POA: Diagnosis not present

## 2022-10-01 DIAGNOSIS — S83271A Complex tear of lateral meniscus, current injury, right knee, initial encounter: Secondary | ICD-10-CM | POA: Diagnosis not present

## 2022-10-01 DIAGNOSIS — M2341 Loose body in knee, right knee: Secondary | ICD-10-CM | POA: Diagnosis not present

## 2022-10-01 DIAGNOSIS — M1711 Unilateral primary osteoarthritis, right knee: Secondary | ICD-10-CM | POA: Diagnosis not present

## 2022-10-18 DIAGNOSIS — H2512 Age-related nuclear cataract, left eye: Secondary | ICD-10-CM | POA: Diagnosis not present

## 2022-10-31 ENCOUNTER — Other Ambulatory Visit: Payer: Self-pay | Admitting: Urology

## 2022-11-01 ENCOUNTER — Telehealth: Payer: Self-pay | Admitting: *Deleted

## 2022-11-01 MED ORDER — TADALAFIL 20 MG PO TABS: 20.0000 mg | ORAL_TABLET | Freq: Every day | ORAL | 0 refills | Status: DC | PRN

## 2022-11-01 NOTE — Telephone Encounter (Signed)
Since he was seen in the office in June 2023 okay to refill.  I signed Rx

## 2022-11-13 DIAGNOSIS — H2512 Age-related nuclear cataract, left eye: Secondary | ICD-10-CM | POA: Diagnosis not present

## 2022-11-28 ENCOUNTER — Other Ambulatory Visit: Payer: Self-pay | Admitting: Surgery

## 2022-12-06 ENCOUNTER — Other Ambulatory Visit: Payer: Self-pay | Admitting: Family Medicine

## 2022-12-06 MED ORDER — TADALAFIL 20 MG PO TABS: 20.0000 mg | ORAL_TABLET | Freq: Every day | ORAL | 0 refills | Status: AC | PRN

## 2022-12-10 DIAGNOSIS — Z96651 Presence of right artificial knee joint: Secondary | ICD-10-CM | POA: Diagnosis not present

## 2022-12-10 DIAGNOSIS — M2341 Loose body in knee, right knee: Secondary | ICD-10-CM | POA: Diagnosis not present

## 2022-12-10 DIAGNOSIS — S83271D Complex tear of lateral meniscus, current injury, right knee, subsequent encounter: Secondary | ICD-10-CM | POA: Diagnosis not present

## 2022-12-10 DIAGNOSIS — M1711 Unilateral primary osteoarthritis, right knee: Secondary | ICD-10-CM | POA: Diagnosis not present

## 2022-12-11 ENCOUNTER — Encounter
Admission: RE | Admit: 2022-12-11 | Discharge: 2022-12-11 | Disposition: A | Discharge status: Home / Self Care | Payer: PPO | Source: Ambulatory Visit | Attending: Surgery | Admitting: Surgery

## 2022-12-11 VITALS — Ht 71.0 in | Wt 205.0 lb

## 2022-12-11 DIAGNOSIS — Z01812 Encounter for preprocedural laboratory examination: Secondary | ICD-10-CM

## 2022-12-11 DIAGNOSIS — Z01818 Encounter for other preprocedural examination: Secondary | ICD-10-CM | POA: Diagnosis not present

## 2022-12-11 DIAGNOSIS — Z01810 Encounter for preprocedural cardiovascular examination: Secondary | ICD-10-CM | POA: Diagnosis not present

## 2022-12-11 HISTORY — DX: Other specified postprocedural states: Z98.890

## 2022-12-11 HISTORY — DX: Atherosclerotic heart disease of native coronary artery without angina pectoris: I25.10

## 2022-12-11 HISTORY — DX: Nausea with vomiting, unspecified: R11.2

## 2022-12-11 HISTORY — DX: Unspecified osteoarthritis, unspecified site: M19.90

## 2022-12-11 HISTORY — DX: Anxiety disorder, unspecified: F41.9

## 2022-12-11 HISTORY — DX: Atherosclerosis of aorta: I70.0

## 2022-12-11 NOTE — Patient Instructions (Addendum)
Your procedure is scheduled UD:[id number] December 20, 2022. Report to Day Surgery inside [place] 2nd floor, stop by registration desk before getting on elevator.  To find out your arrival time please call [phone] between 1PM - 3PM on Wednesday December 19, 2022.  Remember: Instructions that are not followed completely may result in serious medical risk,  up to and including death, or upon the discretion of your surgeon and anesthesiologist your  surgery may need to be rescheduled.     _X__ 1. Do not eat food after midnight the night before your procedure.                 No chewing gum or hard candies. You may drink clear liquids up to 2 hours                 before you are scheduled to arrive for your surgery- DO not drink clear                 liquids within 2 hours of the start of your surgery.                 Clear Liquids include:  water, apple juice without pulp, clear Gatorade, G2 or                  Gatorade Zero (avoid Red/Purple/Blue), Black Coffee or Tea (Do not add                 anything to coffee or tea).  __X__2.   Complete the "Ensure Clear Pre-surgery Clear Carbohydrate Drink" provided to you, 2 hours before arrival. **If you are diabetic you will be provided with an alternative drink, Gatorade Zero or G2.  __X__3.  On the morning of surgery brush your teeth with toothpaste and water, you                may rinse your mouth with mouthwash if you wish.  Do not swallow any toothpaste or mouthwash.     _X__ 4.  No Alcohol for 24 hours before or after surgery.   _X__ 5.  Do Not Smoke or use e-cigarettes For 24 Hours Prior to Your Surgery.                 Do not use any chewable tobacco products for at least 6 hours prior to                 Surgery.  _X__  6.  Do not use any recreational drugs (marijuana, cocaine, heroin, ecstasy, MDMA or other)                For at least one week prior to your surgery.  Combination of these drugs with  anesthesia                May have life threatening results.  ____  7.  Bring all medications with you on the day of surgery if instructed.   __X__ 8.  Notify your doctor if there is any change in your medical condition      (cold, fever, infections).     Do not wear jewelry, make-up, hairpins, clips or nail polish. Do not wear lotions, powders, or perfumes. You may wear deodorant. Do not shave 48 hours prior to surgery. Men may shave face and neck. Do not bring valuables to the hospital.    [facility] is not responsible for any belongings or valuables.  Contacts,  dentures or bridgework may not be worn into surgery. Leave your suitcase in the car. After surgery it may be brought to your room. For patients admitted to the hospital, discharge time is determined by your treatment team.   Patients discharged the day of surgery will not be allowed to drive home.   Make arrangements for someone to be with you for the first 24 hours of your Same Day Discharge.   __X__ Take these medicines the morning of surgery with A SIP OF WATER:    1. escitalopram (LEXAPRO) 20 MG   2. tamsulosin (FLOMAX) 0.4 MG   3. omeprazole (PRILOSEC) 40 MG   4.  5.  6.  ____ Fleet Enema (as directed)   __X__ Use CHG Soap (or wipes) as directed  ____ Use Benzoyl Peroxide Gel as instructed  ____ Use inhalers on the day of surgery  ____ Stop metformin 2 days prior to surgery    ____ Take 1/2 of usual insulin dose the night before surgery. No insulin the morning          of surgery.   ____ Call your PCP, cardiologist, or Pulmonologist if taking Coumadin/Plavix/aspirin and ask when to stop before your surgery.   __X__ One Week prior to surgery- Stop Anti-inflammatories such as Ibuprofen, Aleve, Advil, Motrin, meloxicam (MOBIC), diclofenac, etodolac, ketorolac, Toradol, Daypro, piroxicam, Goody's or BC powders. OK TO USE TYLENOL IF NEEDED   __X__ Stop ALL supplements until after surgery. Ginseng, Maca  Root, Multiple Vitamin,  PSYLLIUM,  and Saw Palmetto    ____ Bring C-Pap to the hospital.    If you have any questions regarding your pre-procedure instructions,  Please call Pre-admit Testing at [phone]     Preparing for Surgery with CHLORHEXIDINE GLUCONATE (CHG) Soap  Chlorhexidine Gluconate (CHG) Soap  o An antiseptic cleaner that kills germs and bonds with the skin to continue killing germs even after washing  o Used for showering the night before surgery and morning of surgery  Before surgery, you can play an important role by reducing the number of germs on your skin.  CHG (Chlorhexidine gluconate) soap is an antiseptic cleanser which kills germs and bonds with the skin to continue killing germs even after washing.  Please do not use if you have an allergy to CHG or antibacterial soaps. If your skin becomes reddened/irritated stop using the CHG.  1. Shower the NIGHT BEFORE SURGERY and the MORNING OF SURGERY with CHG soap.  2. If you choose to wash your hair, wash your hair first as usual with your normal shampoo.  3. After shampooing, rinse your hair and body thoroughly to remove the shampoo.  4. Use CHG as you would any other liquid soap. You can apply CHG directly to the skin and wash gently with a scrungie or a clean washcloth.  5. Apply the CHG soap to your body only from the neck down. Do not use on open wounds or open sores. Avoid contact with your eyes, ears, mouth, and genitals (private parts). Wash face and genitals (private parts) with your normal soap.  6. Wash thoroughly, paying special attention to the area where your surgery will be performed.  7. Thoroughly rinse your body with warm water.  8. Do not shower/wash with your normal soap after using and rinsing off the CHG soap.  9. Pat yourself dry with a clean towel.  10. Wear clean pajamas to bed the night before surgery.  12. Place clean sheets on your bed the night  of your first shower and do not  sleep with pets.  13. Shower again with the CHG soap on the day of surgery prior to arriving at the hospital.  14. Do not apply any deodorants/lotions/powders.  15. Please wear clean clothes to the hospital.

## 2022-12-19 MED ORDER — ORAL CARE MOUTH RINSE: 15.0000 mL | Freq: Once | OROMUCOSAL | Status: AC

## 2022-12-19 MED ORDER — CEFAZOLIN SODIUM-DEXTROSE 2-4 GM/100ML-% IV SOLN: 2.0000 g | INTRAVENOUS | Status: DC

## 2022-12-19 MED ORDER — CHLORHEXIDINE GLUCONATE 0.12 % MT SOLN: 15.0000 mL | Freq: Once | OROMUCOSAL | Status: AC

## 2022-12-19 MED ORDER — LACTATED RINGERS IV SOLN: INTRAVENOUS | Status: DC

## 2022-12-20 ENCOUNTER — Encounter: Payer: Self-pay | Admitting: Surgery

## 2022-12-20 ENCOUNTER — Encounter
Admission: RE | Disposition: A | Discharge status: Home / Self Care | Payer: Self-pay | Source: Home / Self Care | Attending: Surgery

## 2022-12-20 ENCOUNTER — Ambulatory Visit: Payer: PPO | Admitting: Anesthesiology

## 2022-12-20 ENCOUNTER — Other Ambulatory Visit: Payer: Self-pay

## 2022-12-20 ENCOUNTER — Ambulatory Visit
Admission: RE | Admit: 2022-12-20 | Discharge: 2022-12-20 | Disposition: A | Discharge status: Home / Self Care | Payer: PPO | Attending: Surgery | Admitting: Surgery

## 2022-12-20 DIAGNOSIS — K219 Gastro-esophageal reflux disease without esophagitis: Secondary | ICD-10-CM | POA: Diagnosis not present

## 2022-12-20 DIAGNOSIS — X58XXXA Exposure to other specified factors, initial encounter: Secondary | ICD-10-CM | POA: Diagnosis not present

## 2022-12-20 DIAGNOSIS — S83271A Complex tear of lateral meniscus, current injury, right knee, initial encounter: Secondary | ICD-10-CM | POA: Diagnosis not present

## 2022-12-20 DIAGNOSIS — Z96651 Presence of right artificial knee joint: Secondary | ICD-10-CM | POA: Diagnosis not present

## 2022-12-20 DIAGNOSIS — M2341 Loose body in knee, right knee: Secondary | ICD-10-CM | POA: Diagnosis not present

## 2022-12-20 DIAGNOSIS — Z79899 Other long term (current) drug therapy: Secondary | ICD-10-CM | POA: Insufficient documentation

## 2022-12-20 DIAGNOSIS — I700 Atherosclerosis of aorta: Secondary | ICD-10-CM | POA: Insufficient documentation

## 2022-12-20 DIAGNOSIS — I25119 Atherosclerotic heart disease of native coronary artery with unspecified angina pectoris: Secondary | ICD-10-CM | POA: Diagnosis not present

## 2022-12-20 DIAGNOSIS — I2510 Atherosclerotic heart disease of native coronary artery without angina pectoris: Secondary | ICD-10-CM | POA: Insufficient documentation

## 2022-12-20 DIAGNOSIS — F419 Anxiety disorder, unspecified: Secondary | ICD-10-CM | POA: Insufficient documentation

## 2022-12-20 DIAGNOSIS — S83281A Other tear of lateral meniscus, current injury, right knee, initial encounter: Secondary | ICD-10-CM | POA: Diagnosis not present

## 2022-12-20 DIAGNOSIS — M1711 Unilateral primary osteoarthritis, right knee: Secondary | ICD-10-CM | POA: Insufficient documentation

## 2022-12-20 DIAGNOSIS — Z96653 Presence of artificial knee joint, bilateral: Secondary | ICD-10-CM | POA: Diagnosis not present

## 2022-12-20 DIAGNOSIS — Z87891 Personal history of nicotine dependence: Secondary | ICD-10-CM | POA: Diagnosis not present

## 2022-12-20 DIAGNOSIS — T7840XA Allergy, unspecified, initial encounter: Secondary | ICD-10-CM | POA: Diagnosis not present

## 2022-12-20 DIAGNOSIS — I10 Essential (primary) hypertension: Secondary | ICD-10-CM | POA: Diagnosis not present

## 2022-12-20 DIAGNOSIS — F411 Generalized anxiety disorder: Secondary | ICD-10-CM | POA: Diagnosis not present

## 2022-12-20 DIAGNOSIS — J449 Chronic obstructive pulmonary disease, unspecified: Secondary | ICD-10-CM | POA: Insufficient documentation

## 2022-12-20 DIAGNOSIS — Z8546 Personal history of malignant neoplasm of prostate: Secondary | ICD-10-CM | POA: Insufficient documentation

## 2022-12-20 HISTORY — PX: KNEE ARTHROSCOPY: SHX127

## 2022-12-20 SURGERY — ARTHROSCOPY, KNEE
Anesthesia: General | Site: Knee | Laterality: Right

## 2022-12-20 MED ORDER — LACTATED RINGERS IV SOLN: INTRAVENOUS | Status: DC | PRN

## 2022-12-20 MED ORDER — OXYCODONE HCL 5 MG PO TABS: 5.0000 mg | ORAL_TABLET | ORAL | 0 refills | Status: DC | PRN

## 2022-12-20 MED ORDER — ONDANSETRON HCL 4 MG/2ML IJ SOLN
INTRAMUSCULAR | Status: DC | PRN
  Administered 2022-12-20: 4 mg via INTRAVENOUS

## 2022-12-20 MED ORDER — PROPOFOL 10 MG/ML IV BOLUS
INTRAVENOUS | Status: DC | PRN
  Administered 2022-12-20: 200 mg via INTRAVENOUS

## 2022-12-20 MED ORDER — LIDOCAINE HCL (CARDIAC) PF 100 MG/5ML IV SOSY
PREFILLED_SYRINGE | INTRAVENOUS | Status: DC | PRN
  Administered 2022-12-20: 40 mg via INTRAVENOUS

## 2022-12-20 MED ORDER — LIDOCAINE HCL 1 % IJ SOLN
INTRAMUSCULAR | Status: DC | PRN
  Administered 2022-12-20: 30 mL

## 2022-12-20 MED ORDER — OXYCODONE HCL 5 MG/5ML PO SOLN: 5.0000 mg | Freq: Once | ORAL | Status: DC | PRN

## 2022-12-20 MED ORDER — CEFAZOLIN SODIUM-DEXTROSE 2-4 GM/100ML-% IV SOLN
INTRAVENOUS | Status: AC
  Filled 2022-12-20: qty 100

## 2022-12-20 MED ORDER — 0.9 % SODIUM CHLORIDE (POUR BTL) OPTIME
TOPICAL | Status: DC | PRN
  Administered 2022-12-20: 500 mL

## 2022-12-20 MED ORDER — LIDOCAINE HCL (PF) 1 % IJ SOLN
INTRAMUSCULAR | Status: AC
  Filled 2022-12-20: qty 30

## 2022-12-20 MED ORDER — PHENYLEPHRINE 80 MCG/ML (10ML) SYRINGE FOR IV PUSH (FOR BLOOD PRESSURE SUPPORT)
PREFILLED_SYRINGE | INTRAVENOUS | Status: DC | PRN
  Administered 2022-12-20 (×2): 160 ug via INTRAVENOUS

## 2022-12-20 MED ORDER — OXYCODONE HCL 5 MG PO TABS: 5.0000 mg | ORAL_TABLET | Freq: Once | ORAL | Status: DC | PRN

## 2022-12-20 MED ORDER — ACETAMINOPHEN 10 MG/ML IV SOLN
INTRAVENOUS | Status: DC | PRN
  Administered 2022-12-20: 1000 mg via INTRAVENOUS

## 2022-12-20 MED ORDER — FENTANYL CITRATE (PF) 100 MCG/2ML IJ SOLN
INTRAMUSCULAR | Status: DC | PRN
  Administered 2022-12-20: 50 ug via INTRAVENOUS

## 2022-12-20 MED ORDER — FENTANYL CITRATE (PF) 100 MCG/2ML IJ SOLN: 25.0000 ug | INTRAMUSCULAR | Status: DC | PRN

## 2022-12-20 MED ORDER — CEFAZOLIN SODIUM-DEXTROSE 2-3 GM-%(50ML) IV SOLR
INTRAVENOUS | Status: DC | PRN
  Administered 2022-12-20: 2 g via INTRAVENOUS

## 2022-12-20 MED ORDER — FENTANYL CITRATE (PF) 100 MCG/2ML IJ SOLN
INTRAMUSCULAR | Status: AC
  Filled 2022-12-20: qty 2

## 2022-12-20 MED ORDER — BUPIVACAINE-EPINEPHRINE (PF) 0.5% -1:200000 IJ SOLN
INTRAMUSCULAR | Status: DC | PRN
  Administered 2022-12-20 (×2): 30 mL

## 2022-12-20 MED ORDER — KETOROLAC TROMETHAMINE 15 MG/ML IJ SOLN
15.0000 mg | Freq: Once | INTRAMUSCULAR | Status: AC
  Administered 2022-12-20: 15 mg via INTRAVENOUS

## 2022-12-20 MED ORDER — BUPIVACAINE-EPINEPHRINE (PF) 0.5% -1:200000 IJ SOLN
INTRAMUSCULAR | Status: AC
  Filled 2022-12-20: qty 30

## 2022-12-20 MED ORDER — KETOROLAC TROMETHAMINE 15 MG/ML IJ SOLN
INTRAMUSCULAR | Status: AC
  Filled 2022-12-20: qty 1

## 2022-12-20 MED ORDER — DEXAMETHASONE SODIUM PHOSPHATE 10 MG/ML IJ SOLN
INTRAMUSCULAR | Status: DC | PRN
  Administered 2022-12-20: 5 mg via INTRAVENOUS

## 2022-12-20 MED ORDER — RINGERS IRRIGATION IR SOLN
Status: DC | PRN
  Administered 2022-12-20: 4000 mL

## 2022-12-20 MED ORDER — CHLORHEXIDINE GLUCONATE 0.12 % MT SOLN
OROMUCOSAL | Status: AC
  Administered 2022-12-20: 15 mL via OROMUCOSAL
  Filled 2022-12-20: qty 15

## 2022-12-20 SURGICAL SUPPLY — 45 items
APL PRP STRL LF DISP 70% ISPRP (MISCELLANEOUS) ×2
BLADE FULL RADIUS 3.5 (BLADE) ×1 IMPLANT
BLADE SHAVER 4.5X7 STR FR (MISCELLANEOUS) ×1 IMPLANT
BNDG ELASTIC 6X5.8 VLCR STR LF (GAUZE/BANDAGES/DRESSINGS) ×1 IMPLANT
BNDG ESMARCH 6 X 12 STRL LF (GAUZE/BANDAGES/DRESSINGS) ×1
BNDG ESMARCH 6X12 STRL LF (GAUZE/BANDAGES/DRESSINGS) ×1 IMPLANT
CATH ROBINSON RED A/P 12FR (CATHETERS) IMPLANT
CHLORAPREP W/TINT 26 (MISCELLANEOUS) ×1 IMPLANT
COLLECTOR GRAFT TISSUE (SYSTAGENIX WOUND MANAGEMENT)
CUFF TOURN SGL QUICK 24 (TOURNIQUET CUFF)
CUFF TOURN SGL QUICK 34 (TOURNIQUET CUFF)
CUFF TRNQT CYL 24X4X16.5-23 (TOURNIQUET CUFF) IMPLANT
CUFF TRNQT CYL 34X4.125X (TOURNIQUET CUFF) IMPLANT
CUP MEDICINE 2OZ PLAST GRAD ST (MISCELLANEOUS) ×1 IMPLANT
DRAPE ARTHRO LIMB 89X125 STRL (DRAPES) ×1 IMPLANT
DRAPE IMP U-DRAPE 54X76 (DRAPES) ×1 IMPLANT
ELECT REM PT RETURN 9FT ADLT (ELECTROSURGICAL) ×1
ELECTRODE REM PT RTRN 9FT ADLT (ELECTROSURGICAL) ×1 IMPLANT
GAUZE SPONGE 4X4 12PLY STRL (GAUZE/BANDAGES/DRESSINGS) ×1 IMPLANT
GAUZE SPONGE 4X4 16PLY XRAY LF (GAUZE/BANDAGES/DRESSINGS) IMPLANT
GAUZE XEROFORM 1X8 LF (GAUZE/BANDAGES/DRESSINGS) IMPLANT
GLOVE BIO SURGEON STRL SZ8 (GLOVE) ×2 IMPLANT
GLOVE SURG UNDER LTX SZ8 (GLOVE) ×1 IMPLANT
GOWN STRL REUS W/ TWL LRG LVL3 (GOWN DISPOSABLE) ×1 IMPLANT
GOWN STRL REUS W/ TWL XL LVL3 (GOWN DISPOSABLE) ×2 IMPLANT
GOWN STRL REUS W/TWL LRG LVL3 (GOWN DISPOSABLE) ×1
GOWN STRL REUS W/TWL XL LVL3 (GOWN DISPOSABLE) ×2
IV LACTATED RINGER IRRG 3000ML (IV SOLUTION) ×1
IV LACTATED RINGERS 1000ML (IV SOLUTION) IMPLANT
IV LR IRRIG 3000ML ARTHROMATIC (IV SOLUTION) ×1 IMPLANT
KIT TURNOVER KIT A (KITS) ×1 IMPLANT
MANIFOLD NEPTUNE II (INSTRUMENTS) ×2 IMPLANT
NDL HYPO 21X1.5 SAFETY (NEEDLE) ×1 IMPLANT
NEEDLE HYPO 21X1.5 SAFETY (NEEDLE) ×1 IMPLANT
PACK ARTHROSCOPY KNEE (MISCELLANEOUS) ×1 IMPLANT
SLEEVE REMOTE CONTROL 5X12 (DRAPES) IMPLANT
SUT PROLENE 4 0 PS 2 18 (SUTURE) ×1 IMPLANT
SYR 30ML LL (SYRINGE) ×1 IMPLANT
SYR 50ML LL SCALE MARK (SYRINGE) ×1 IMPLANT
SYR TOOMEY 50ML (SYRINGE) IMPLANT
TISSUE GRAFT COLLECTOR (SYSTAGENIX WOUND MANAGEMENT) IMPLANT
TRAP FLUID SMOKE EVACUATOR (MISCELLANEOUS) ×1 IMPLANT
TUBING INFLOW SET DBFLO PUMP (TUBING) ×1 IMPLANT
WAND WEREWOLF FLOW 90D (MISCELLANEOUS) ×1 IMPLANT
WATER STERILE IRR 500ML POUR (IV SOLUTION) ×1 IMPLANT

## 2022-12-20 NOTE — Op Note (Signed)
12/20/2022  3:30 PM  Patient:   Adrian Hart  Pre-Op Diagnosis:   Lateral meniscus tear and loose body status post partial knee replacement, right knee.  Postoperative diagnosis:   Same with mildly progressive degenerative joint disease, right knee.  Procedure:   Extensive arthroscopic synovectomy with partial lateral meniscectomy and excision of loose body, right knee.  Surgeon:   Pascal Lux, MD  Assistant:   Jerrilyn Cairo, PA-S  Anesthesia:   General LMA  Findings:   As above. There were diffuse grade II-III chondromalacia changes involving the central portion of the femoral trochlea, grade II chondromalacia changes involving the patella, and grade I chondromalacia changes involving the lateral compartment. The anterior and posterior cruciate ligaments both were in satisfactory condition. The previously placed medial compartment arthroplasty components appeared to be in good position and without evidence of loosening.  Complications:   None  EBL:   2 cc.  Total fluids:   500 cc of crystalloid.  Tourniquet time:   None  Drains:   None  Closure:   4-0 Prolene interrupted sutures.  Brief clinical note:   The patient is a 72 year old male who is now 5+ years status post a right partial knee replacement the patient was doing well until about a year ago when he began to develop intermittent painful effusions of his right knee without any antecedent injury. The knee was aspirated and showed no evidence for infection.  Initially, the patient responded to nonsurgical treatment, but because the symptoms recurred several times, an MRI was performed which suggested a tear involving the lateral meniscus. His plain radiographs also demonstrated evidence of retained piece of cement which had not changed position since his original surgery. The patient presents at this time for arthroscopy, debridement, removal of the loose body, and partial lateral meniscectomy.  Procedure:   The patient  was brought into the operating room and lain in the supine position. After adequate general laryngeal mask anesthesia was obtained, a timeout was performed to verify the appropriate side. The patient's right knee was injected sterilely using a solution of 30 cc of 1% lidocaine and 30 cc of 0.5% Sensorcaine with epinephrine. The right lower extremity was prepped with ChloraPrep solution before being draped sterilely. Preoperative antibiotics were administered. The expected portal sites were injected with 0.5% Sensorcaine with epinephrine before the camera was placed in the anterolateral portal and instrumentation performed through the anteromedial portal.   The knee was sequentially examined beginning in the suprapatellar pouch, then progressing to the patellofemoral space, the medial gutter and compartment, the notch, and finally the lateral compartment and gutter. The findings were as described above. Areas of extensive reactive and hemorrhagic synovial tissue anteriorly, superiorly, medially, and laterally were debrided back to stable margins using the full-radius resector. In addition, the areas of tearing involving the anterior, middle, and posterior portions of the lateral meniscus were debrided back to stable margins using the full-radius resector. Subsequent probing of the remaining rim demonstrated excellent stability. Finally, the retained foreign body was identified and superomedially and removed using large graspers. The ArthroCare wand was introduced and used to obtain hemostasis as well as to debride additional areas of synovitis. The instruments were removed from the joint after suctioning the excess fluid.   The portal sites were closed using 4-0 Prolene interrupted sutures before a sterile bulky dressing was applied to the knee. The patient was then awakened, extubated, and returned to the recovery room in satisfactory condition after tolerating the procedure well.

## 2022-12-20 NOTE — H&P (Signed)
History of Present Illness: Adrian Hart is a 72 y.o. male who presents today for his surgical history and physical for upcoming right knee arthroscopy with debridement, partial lateral meniscectomy and removal of loose retained piece of cement. Surgery scheduled with Dr. Roland Rack on 12/20/2022. The patient feels that he is doing well at today's visit, he reports a 1 out of 10 pain score at today's appointment. He denies any trauma or injury affecting the right knee since his last evaluation. Since he last underwent his right knee aspiration he states that the knee has been doing well and he has not had any recurrent effusion occur. The patient denies any personal history of heart attack or stroke. He denies any history of DVT or diabetes. He does report a history of mild COPD but he does not use any inhalers. Pain score today is a 1 out of 10 to the right lower extremity at today's appointment.  Past Medical History: Cataract cortical, senile  Right eye October 2017  COPD (chronic obstructive pulmonary disease) (CMS-HCC)  Coronary artery disease involving native coronary artery 09/04/2018  GAD (generalized anxiety disorder) 03/30/2022  GERD (gastroesophageal reflux disease)  PONV (postoperative nausea and vomiting)  PONV, "hangover for 3 days"  Post-traumatic osteoarthritis of left knee 12/10/2016  Prostate cancer (CMS-HCC) 09/2018   Past Surgical History: Left knee arthroscopy 1995  COLONOSCOPY 07/29/2002 (Int Hemorrhoids, Diverticulosis: CBF 07/2012; Sch'ed 09/2017 then postponed d/t knee replacement per RTE; CBF 03/2018; Recall Ltr mailed 01/31/2018)  HERNIA REPAIR 2016  EXTRACTION CATARACT EXTRACAPSULAR W/INSERTION INTRAOCULAR PROSTHESIS Right 07/13/2015  Procedure: -R- LRI /ORA EXTRACTION CATARACT EXTRACAPSULAR WITH PHACO WITH INSERTION INTRAOCULAR PROSTHESIS; Surgeon: Netta Corrigan, MD; Location: [facility] OR; Service: Ophthalmology; Laterality: Right; LRI/comx [place] NO LENSX PER MICHELLE / 8/3  LR  CATARACT EXTRACTION October 2017  Right unicondylar knee arthroplasty. Right 09/05/2017 (Dr. Roland Rack)  COLONOSCOPY 05/02/2018 (Adenomatous Polyps: CBF 04/2021)  Left unicondylar knee arthroplasty. Left 09/01/2019 (Dr. Roland Rack)  [place](?) - (Dr. Raylene Everts @ [facility])  KNEE ARTHROSCOPY  TONSILLECTOMY as a child   Past Family History: Macular degeneration Mother  Glaucoma Neg Hx  Anesthesia problems Neg Hx   Medications: escitalopram oxalate (LEXAPRO) 20 MG tablet Take 1 tablet (20 mg total) by mouth once daily 30 tablet 5  ibuprofen (MOTRIN) 200 MG tablet Take 200 mg by mouth as needed for Pain  omeprazole (PRILOSEC) 40 MG DR capsule Take 1 capsule (40 mg total) by mouth once daily 30 capsule 11  psyllium husk/aspartame (NATURAL PSYLLIUM FIBER ORAL) Take 1 tablet by mouth once daily.  rosuvastatin (CRESTOR) 10 MG tablet Take 1 tablet (10 mg total) by mouth once daily 90 tablet 3  tadalafiL (CIALIS) 20 MG tablet Take 1 tablet by mouth once daily as needed  traZODone (DESYREL) 50 MG tablet Take 1 tablet (50 mg total) by mouth at bedtime as needed for Sleep 30 tablet 11  saw palmetto 500 MG capsule Take 1 capsule by mouth 3 (three) times daily with meals (Patient not taking: Reported on 10/01/2022)   Allergies: Mussels Vomiting  Flu-like symptoms   Review of Systems:  A comprehensive 14 point ROS was performed, reviewed by me today, and the pertinent orthopaedic findings are documented in the HPI.  Physical Exam: BP (!) 142/82  Ht 180.3 cm ('5\' 11"'$ )  Wt 93 kg (205 lb)  BMI 28.59 kg/m  General/Constitutional: The patient appears to be well-nourished, well-developed, and in no acute distress. Neuro/Psych: Normal mood and affect, oriented to person, place and  time. Eyes: Non-icteric. Pupils are equal, round, and reactive to light, and exhibit synchronous movement. ENT: Unremarkable. Lymphatic: No palpable adenopathy. Respiratory: Lungs clear to auscultation, Normal  chest excursion, No wheezes, and Non-labored breathing Cardiovascular: Regular rate and rhythm. No murmurs. and No edema, swelling or tenderness, except as noted in detailed exam. Integumentary: No impressive skin lesions present, except as noted in detailed exam. Musculoskeletal: Unremarkable, except as noted in detailed exam.  Right knee exam: The patient ambulates with a mild limp, favoring his right leg, but is not using any assistive devices. Skin inspection of the right knee is notable for well-healed surgical incision, but otherwise is unremarkable. No swelling, erythema, ecchymosis, abrasions, or other skin abnormalities are identified. There is at most a trace effusion. He has mild-moderate focal tenderness to palpation along the lateral joint line, but there is no medial joint line tenderness and no peripatellar tenderness. Actively, he is able to extend his knee to 0 degrees and flex to 130 degrees without pain. The patella tracks well and is without crepitance. The knee is stable to varus valgus stressing. He is neurovascular intact to the right lower extremity and foot.  X-rays/MRI/Lab data:  A recent MRI scan of the right knee is available for review and has been reviewed by myself. By report, the study demonstrates evidence of a horizontal tear of the lateral meniscus, as well as some early degenerative changes of the lateral and patellofemoral compartments. There is no evidence for ligamentous pathology or any acute bony abnormalities.   Recent standing AP and lateral x-rays of the right knee, as well as a sunrise view, are available for review and have been reviewed by myself. These films demonstrate excellent position of the femoral and tibial components which are without evidence of loosening. The meniscal bearing appears to be appropriate located. The lateral and patellofemoral compartments appear to be reasonably well-maintained, although there appears to be mild narrowing of the  lateral compartment as compared to the initial postoperative films. There also is evidence of a piece of cement that is in the superomedial aspect of the medial femoral condyle. This appears to have moved from its previous location in the postero-medial aspect the knee on his initial postoperative films.  Impression: 1. Complex tear of lateral meniscus of right knee. 2. Loose body of right knee 3. Primary osteoarthritis of right knee 4. Status post right partial knee replacement  Plan:  1. Treatment options were discussed today with the patient. 2. The patient is scheduled for a right knee arthroscopy with debridement, lateral meniscectomy and removal of loose retained piece of cement. Surgery scheduled with Dr. Roland Rack on 12/20/2022. 3. The patient was instructed on the risk and benefits of surgery and wishes to proceed at this time. 4. This document will serve as a surgical history and physical for the patient. 5. The patient will follow-up per standard postop protocol. They can call the clinic they have any questions, new symptoms develop or symptoms worsen.  The procedure was discussed with the patient, as were the potential risks (including bleeding, infection, nerve and/or blood vessel injury, persistent or recurrent pain, failure of the repair, progression of arthritis, need for further surgery, blood clots, strokes, heart attacks and/or arhythmias, pneumonia, etc.) and benefits. The patient states his understanding and wishes to proceed.    H&P reviewed and patient re-examined. No changes.

## 2022-12-20 NOTE — Anesthesia Preprocedure Evaluation (Addendum)
Anesthesia Evaluation  Patient identified by MRN, date of birth, ID band Patient awake    Reviewed: Allergy & Precautions, NPO status , Patient's Chart, lab work & pertinent test results  History of Anesthesia Complications (+) PONV and history of anesthetic complications  Airway Mallampati: III  TM Distance: <3 FB Neck ROM: full    Dental  (+) Chipped   Pulmonary neg shortness of breath, COPD, former smoker   Pulmonary exam normal        Cardiovascular Exercise Tolerance: Good hypertension, + angina  + CAD  Normal cardiovascular exam     Neuro/Psych   Anxiety     negative neurological ROS  negative psych ROS   GI/Hepatic Neg liver ROS,GERD  Controlled,,  Endo/Other  negative endocrine ROS    Renal/GU      Musculoskeletal   Abdominal   Peds  Hematology negative hematology ROS (+)   Anesthesia Other Findings Past Medical History: No date: Anxiety No date: Arthritis No date: COPD (chronic obstructive pulmonary disease) (HCC) No date: Coronary artery disease No date: GERD (gastroesophageal reflux disease)     Comment:  PT TAKES APPLE CIDER VINEGAR  No date: Hypertension     Comment:  borderline pressures.  takes no meds No date: PONV (postoperative nausea and vomiting) No date: Prostate cancer ([place])     Comment:   needs no treatment. still on observation No date: Thoracic aortic atherosclerosis [facility])  Past Surgical History: 2003: COLONOSCOPY     Comment:  Dr Alveta Heimlich 05/02/2018: COLONOSCOPY WITH PROPOFOL; N/A     Comment:  Procedure: COLONOSCOPY WITH PROPOFOL;  Surgeon: Manya Silvas, MD;  Location: [facility] ENDOSCOPY;  Service:               Endoscopy;  Laterality: N/A; No date: EYE SURGERY; Right     Comment:  cataract 1996: FLEXIBLE SIGMOIDOSCOPY     Comment:  Dr Bary Castilla 04-20-04: HEMORRHOID SURGERY     Comment:  Dr Bary Castilla 09/27/2015: HERNIA REPAIR     Comment:  Umbilical, primary  repair 1995: KNEE SURGERY; Left     Comment:  arthroscopy 09/05/2017: PARTIAL KNEE ARTHROPLASTY; Right     Comment:  Procedure: UNICOMPARTMENTAL KNEE;  Surgeon: Corky Mull, MD;  Location: [facility] ORS;  Service: Orthopedics;                Laterality: Right; 09/01/2019: PARTIAL KNEE ARTHROPLASTY; Left     Comment:  Procedure: UNICOMPARTMENTAL KNEE;  Surgeon: Corky Mull, MD;  Location: [facility] ORS;  Service: Orthopedics;                Laterality: Left; No date: TONSILLECTOMY 13/24/4010: UMBILICAL HERNIA REPAIR; N/A     Comment:  Procedure: HERNIA REPAIR UMBILICAL ADULT;  Surgeon:               Robert Bellow, MD;  Location: [facility] ORS;  Service:               General;  Laterality: N/A; 07/28/2020: XI ROBOTIC ASSISTED INGUINAL HERNIA REPAIR WITH MESH; Left     Comment:  Procedure: XI ROBOTIC ASSISTED INGUINAL HERNIA REPAIR               WITH MESH;  Surgeon: Jules Husbands, MD;  Location: [facility]               ORS;  Service: General;  Laterality: Left;  BMI    Body Mass Index: 28.59 kg/m      Reproductive/Obstetrics negative OB ROS                             Anesthesia Physical Anesthesia Plan  ASA: 2  Anesthesia Plan: General LMA   Post-op Pain Management:    Induction: Intravenous  PONV Risk Score and Plan: Dexamethasone, Ondansetron, Midazolam and Treatment may vary due to age or medical condition  Airway Management Planned: LMA  Additional Equipment:   Intra-op Plan:   Post-operative Plan: Extubation in OR  Informed Consent: I have reviewed the patients History and Physical, chart, labs and discussed the procedure including the risks, benefits and alternatives for the proposed anesthesia with the patient or authorized representative who has indicated his/her understanding and acceptance.     Dental Advisory Given  Plan Discussed with: Anesthesiologist, CRNA and Surgeon  Anesthesia Plan Comments: (Patient  consented for risks of anesthesia including but not limited to:  - adverse reactions to medications - damage to eyes, teeth, lips or other oral mucosa - nerve damage due to positioning  - sore throat or hoarseness - Damage to heart, brain, nerves, lungs, other parts of body or loss of life  Patient voiced understanding.)       Anesthesia Quick Evaluation

## 2022-12-20 NOTE — Anesthesia Postprocedure Evaluation (Signed)
Anesthesia Post Note  Patient: Adrian Hart  Procedure(s) Performed: RIGHT KNEE ARTHROSCOPY WITH DEBRIDEMENT, PARTIAL LATERAL MENISCECTOMY, AND EXCISION OF LOOSE BODY RIGHT KNEE. (Right: Knee)  Patient location during evaluation: PACU Anesthesia Type: General Level of consciousness: awake and alert Pain management: pain level controlled Vital Signs Assessment: post-procedure vital signs reviewed and stable Respiratory status: spontaneous breathing, nonlabored ventilation, respiratory function stable and patient connected to nasal cannula oxygen Cardiovascular status: blood pressure returned to baseline and stable Postop Assessment: no apparent nausea or vomiting Anesthetic complications: no   No notable events documented.   Last Vitals:  Vitals:   12/20/22 1517 12/20/22 1530  BP: (!) 157/96 (!) 153/84  Pulse: 71 72  Resp: 16 12  Temp: (!) 36.3 C   SpO2: 100% 100%    Last Pain:  Vitals:   12/20/22 1530  TempSrc:   PainSc: 0-No pain                 Molli Barrows

## 2022-12-20 NOTE — Discharge Instructions (Addendum)
Orthopedic discharge instructions: Keep dressing dry and intact.  May shower after dressing changed on post-op day #4 (Monday).  Cover staples with Band-Aids after drying off. Apply ice frequently to knee or use Polar Care device. Take ibuprofen 600-800 mg TID OR Aleve 2 tabs BID with meals for 3-5 days, then as necessary. Take pain medication as prescribed or ES Tylenol when needed.  May weight-bear as tolerated - use crutches or walker as needed. Follow-up in 10-14 days or as scheduled.   AMBULATORY SURGERY  DISCHARGE INSTRUCTIONS   The drugs that you were given will stay in your system until tomorrow so for the next 24 hours you should not:  Drive an automobile Make any legal decisions Drink any alcoholic beverage   You may resume regular meals tomorrow.  Today it is better to start with liquids and gradually work up to solid foods.  You may eat anything you prefer, but it is better to start with liquids, then soup and crackers, and gradually work up to solid foods.   Please notify your doctor immediately if you have any unusual bleeding, trouble breathing, redness and pain at the surgery site, drainage, fever, or pain not relieved by medication.    Additional Instructions:        Please contact your physician with any problems or Same Day Surgery at [phone], Monday through Friday 6 am to 4 pm, or [place] at [facility] number at [phone].

## 2022-12-20 NOTE — Transfer of Care (Signed)
Immediate Anesthesia Transfer of Care Note  Patient: Adrian Hart  Procedure(s) Performed: RIGHT KNEE ARTHROSCOPY WITH DEBRIDEMENT, PARTIAL LATERAL MENISCECTOMY, AND EXCISION OF LOOSE BODY RIGHT KNEE. (Right: Knee)  Patient Location: PACU  Anesthesia Type:General  Level of Consciousness: awake  Airway & Oxygen Therapy: Patient Spontanous Breathing and Patient connected to face mask oxygen  Post-op Assessment: Report given to RN, Post -op Vital signs reviewed and stable, and Patient moving all extremities X 4  Post vital signs: Reviewed  Last Vitals:  Vitals Value Taken Time  BP 157/96 12/20/22 1517  Temp 36.3 C 12/20/22 1517  Pulse 68 12/20/22 1520  Resp 14 12/20/22 1520  SpO2 100 % 12/20/22 1520  Vitals shown include unvalidated device data.  Last Pain:  Vitals:   12/20/22 1517  TempSrc:   PainSc: 0-No pain         Complications: No notable events documented.

## 2022-12-21 ENCOUNTER — Encounter: Payer: Self-pay | Admitting: Surgery

## 2022-12-21 DIAGNOSIS — S83271D Complex tear of lateral meniscus, current injury, right knee, subsequent encounter: Secondary | ICD-10-CM | POA: Diagnosis not present

## 2022-12-23 DIAGNOSIS — Z0289 Encounter for other administrative examinations: Secondary | ICD-10-CM | POA: Diagnosis not present

## 2022-12-23 DIAGNOSIS — Z978 Presence of other specified devices: Secondary | ICD-10-CM | POA: Diagnosis not present

## 2022-12-23 DIAGNOSIS — Z9889 Other specified postprocedural states: Secondary | ICD-10-CM | POA: Diagnosis not present

## 2022-12-24 ENCOUNTER — Other Ambulatory Visit: Payer: Self-pay | Admitting: Surgery

## 2022-12-25 ENCOUNTER — Encounter
Admission: RE | Disposition: A | Discharge status: Home / Self Care | Payer: Self-pay | Source: Ambulatory Visit | Attending: Surgery

## 2022-12-25 ENCOUNTER — Ambulatory Visit
Admission: RE | Admit: 2022-12-25 | Discharge: 2022-12-25 | Disposition: A | Discharge status: Home / Self Care | Payer: HMO | Source: Ambulatory Visit | Attending: Surgery | Admitting: Surgery

## 2022-12-25 ENCOUNTER — Ambulatory Visit: Payer: HMO | Admitting: Certified Registered Nurse Anesthetist

## 2022-12-25 ENCOUNTER — Other Ambulatory Visit: Payer: Self-pay

## 2022-12-25 ENCOUNTER — Encounter: Payer: Self-pay | Admitting: Surgery

## 2022-12-25 DIAGNOSIS — T8131XA Disruption of external operation (surgical) wound, not elsewhere classified, initial encounter: Secondary | ICD-10-CM | POA: Diagnosis not present

## 2022-12-25 DIAGNOSIS — I10 Essential (primary) hypertension: Secondary | ICD-10-CM | POA: Insufficient documentation

## 2022-12-25 DIAGNOSIS — K219 Gastro-esophageal reflux disease without esophagitis: Secondary | ICD-10-CM | POA: Insufficient documentation

## 2022-12-25 DIAGNOSIS — Z96651 Presence of right artificial knee joint: Secondary | ICD-10-CM | POA: Insufficient documentation

## 2022-12-25 DIAGNOSIS — M1711 Unilateral primary osteoarthritis, right knee: Secondary | ICD-10-CM | POA: Diagnosis not present

## 2022-12-25 DIAGNOSIS — I700 Atherosclerosis of aorta: Secondary | ICD-10-CM | POA: Diagnosis not present

## 2022-12-25 DIAGNOSIS — M25061 Hemarthrosis, right knee: Secondary | ICD-10-CM | POA: Insufficient documentation

## 2022-12-25 DIAGNOSIS — Z79899 Other long term (current) drug therapy: Secondary | ICD-10-CM | POA: Insufficient documentation

## 2022-12-25 DIAGNOSIS — Z87891 Personal history of nicotine dependence: Secondary | ICD-10-CM | POA: Insufficient documentation

## 2022-12-25 DIAGNOSIS — S83271A Complex tear of lateral meniscus, current injury, right knee, initial encounter: Secondary | ICD-10-CM | POA: Diagnosis not present

## 2022-12-25 DIAGNOSIS — I2510 Atherosclerotic heart disease of native coronary artery without angina pectoris: Secondary | ICD-10-CM | POA: Insufficient documentation

## 2022-12-25 DIAGNOSIS — Z8546 Personal history of malignant neoplasm of prostate: Secondary | ICD-10-CM | POA: Insufficient documentation

## 2022-12-25 DIAGNOSIS — J449 Chronic obstructive pulmonary disease, unspecified: Secondary | ICD-10-CM | POA: Diagnosis not present

## 2022-12-25 DIAGNOSIS — Y838 Other surgical procedures as the cause of abnormal reaction of the patient, or of later complication, without mention of misadventure at the time of the procedure: Secondary | ICD-10-CM | POA: Diagnosis not present

## 2022-12-25 DIAGNOSIS — M2341 Loose body in knee, right knee: Secondary | ICD-10-CM | POA: Diagnosis not present

## 2022-12-25 DIAGNOSIS — F419 Anxiety disorder, unspecified: Secondary | ICD-10-CM | POA: Diagnosis not present

## 2022-12-25 HISTORY — PX: KNEE ARTHROSCOPY: SHX127

## 2022-12-25 SURGERY — ARTHROSCOPY, KNEE
Anesthesia: General | Site: Knee | Laterality: Right

## 2022-12-25 MED ORDER — CHLORHEXIDINE GLUCONATE 0.12 % MT SOLN
OROMUCOSAL | Status: AC
  Administered 2022-12-25: 15 mL via OROMUCOSAL
  Filled 2022-12-25: qty 15

## 2022-12-25 MED ORDER — OXYCODONE HCL 5 MG PO TABS
ORAL_TABLET | ORAL | Status: AC
  Filled 2022-12-25: qty 1

## 2022-12-25 MED ORDER — EPHEDRINE 5 MG/ML INJ
INTRAVENOUS | Status: AC
  Filled 2022-12-25: qty 5

## 2022-12-25 MED ORDER — PROPOFOL 10 MG/ML IV BOLUS
INTRAVENOUS | Status: AC
  Filled 2022-12-25: qty 20

## 2022-12-25 MED ORDER — RINGERS IRRIGATION IR SOLN
Status: DC | PRN
  Administered 2022-12-25: 3000 mL

## 2022-12-25 MED ORDER — ACETAMINOPHEN 10 MG/ML IV SOLN
INTRAVENOUS | Status: DC | PRN
  Administered 2022-12-25: 1000 mg via INTRAVENOUS

## 2022-12-25 MED ORDER — GLYCOPYRROLATE 0.2 MG/ML IJ SOLN
INTRAMUSCULAR | Status: DC | PRN
  Administered 2022-12-25 (×2): .2 mg via INTRAVENOUS

## 2022-12-25 MED ORDER — CEFAZOLIN SODIUM-DEXTROSE 2-4 GM/100ML-% IV SOLN
2.0000 g | INTRAVENOUS | Status: AC
  Administered 2022-12-25: 2 g via INTRAVENOUS

## 2022-12-25 MED ORDER — FENTANYL CITRATE (PF) 100 MCG/2ML IJ SOLN
INTRAMUSCULAR | Status: AC
  Filled 2022-12-25: qty 2

## 2022-12-25 MED ORDER — BUPIVACAINE LIPOSOME 1.3 % IJ SUSP
INTRAMUSCULAR | Status: AC
  Filled 2022-12-25: qty 20

## 2022-12-25 MED ORDER — DEXAMETHASONE SODIUM PHOSPHATE 10 MG/ML IJ SOLN
INTRAMUSCULAR | Status: AC
  Filled 2022-12-25: qty 1

## 2022-12-25 MED ORDER — BUPIVACAINE LIPOSOME 1.3 % IJ SUSP
INTRAMUSCULAR | Status: DC | PRN
  Administered 2022-12-25: 10 mL

## 2022-12-25 MED ORDER — ACETAMINOPHEN 10 MG/ML IV SOLN
INTRAVENOUS | Status: AC
  Filled 2022-12-25: qty 100

## 2022-12-25 MED ORDER — PHENYLEPHRINE HCL (PRESSORS) 10 MG/ML IV SOLN
INTRAVENOUS | Status: DC | PRN
  Administered 2022-12-25 (×2): 80 ug via INTRAVENOUS

## 2022-12-25 MED ORDER — CHLORHEXIDINE GLUCONATE 0.12 % MT SOLN: 15.0000 mL | Freq: Once | OROMUCOSAL | Status: AC

## 2022-12-25 MED ORDER — DEXAMETHASONE SODIUM PHOSPHATE 10 MG/ML IJ SOLN
INTRAMUSCULAR | Status: DC | PRN
  Administered 2022-12-25: 8 mg via INTRAVENOUS

## 2022-12-25 MED ORDER — BUPIVACAINE-EPINEPHRINE (PF) 0.5% -1:200000 IJ SOLN
INTRAMUSCULAR | Status: AC
  Filled 2022-12-25: qty 30

## 2022-12-25 MED ORDER — ONDANSETRON HCL 4 MG/2ML IJ SOLN
INTRAMUSCULAR | Status: DC | PRN
  Administered 2022-12-25: 4 mg via INTRAVENOUS

## 2022-12-25 MED ORDER — EPHEDRINE SULFATE (PRESSORS) 50 MG/ML IJ SOLN
INTRAMUSCULAR | Status: DC | PRN
  Administered 2022-12-25: 5 mg via INTRAVENOUS
  Administered 2022-12-25: 10 mg via INTRAVENOUS

## 2022-12-25 MED ORDER — BUPIVACAINE-EPINEPHRINE (PF) 0.5% -1:200000 IJ SOLN
INTRAMUSCULAR | Status: DC | PRN
  Administered 2022-12-25: 20 mL

## 2022-12-25 MED ORDER — OXYCODONE HCL 5 MG PO TABS: 5.0000 mg | ORAL_TABLET | ORAL | 0 refills | Status: DC | PRN

## 2022-12-25 MED ORDER — FENTANYL CITRATE (PF) 100 MCG/2ML IJ SOLN
25.0000 ug | INTRAMUSCULAR | Status: DC | PRN
  Administered 2022-12-25 (×2): 25 ug via INTRAVENOUS

## 2022-12-25 MED ORDER — LIDOCAINE HCL (CARDIAC) PF 100 MG/5ML IV SOSY
PREFILLED_SYRINGE | INTRAVENOUS | Status: DC | PRN
  Administered 2022-12-25: 80 mg via INTRAVENOUS

## 2022-12-25 MED ORDER — ONDANSETRON HCL 4 MG/2ML IJ SOLN
INTRAMUSCULAR | Status: AC
  Filled 2022-12-25: qty 2

## 2022-12-25 MED ORDER — ORAL CARE MOUTH RINSE: 15.0000 mL | Freq: Once | OROMUCOSAL | Status: AC

## 2022-12-25 MED ORDER — CEFAZOLIN SODIUM-DEXTROSE 2-4 GM/100ML-% IV SOLN
INTRAVENOUS | Status: AC
  Filled 2022-12-25: qty 100

## 2022-12-25 MED ORDER — BUPIVACAINE LIPOSOME 1.3 % IJ SUSP
INTRAMUSCULAR | Status: AC
  Filled 2022-12-25: qty 10

## 2022-12-25 MED ORDER — FENTANYL CITRATE (PF) 100 MCG/2ML IJ SOLN
INTRAMUSCULAR | Status: DC | PRN
  Administered 2022-12-25 (×2): 25 ug via INTRAVENOUS
  Administered 2022-12-25: 50 ug via INTRAVENOUS

## 2022-12-25 MED ORDER — DEXMEDETOMIDINE HCL IN NACL 80 MCG/20ML IV SOLN
INTRAVENOUS | Status: DC | PRN
  Administered 2022-12-25 (×2): 8 ug via BUCCAL

## 2022-12-25 MED ORDER — OXYCODONE HCL 5 MG PO TABS
5.0000 mg | ORAL_TABLET | Freq: Once | ORAL | Status: AC | PRN
  Administered 2022-12-25: 5 mg via ORAL

## 2022-12-25 MED ORDER — OXYCODONE HCL 5 MG/5ML PO SOLN: 5.0000 mg | Freq: Once | ORAL | Status: AC | PRN

## 2022-12-25 MED ORDER — LACTATED RINGERS IV SOLN: INTRAVENOUS | Status: DC

## 2022-12-25 MED ORDER — PROPOFOL 10 MG/ML IV BOLUS
INTRAVENOUS | Status: DC | PRN
  Administered 2022-12-25: 150 mg via INTRAVENOUS
  Administered 2022-12-25: 50 mg via INTRAVENOUS

## 2022-12-25 SURGICAL SUPPLY — 48 items
APL PRP STRL LF DISP 70% ISPRP (MISCELLANEOUS) ×2
BLADE FULL RADIUS 3.5 (BLADE) ×1 IMPLANT
BLADE SHAVER 4.5X7 STR FR (MISCELLANEOUS) ×1 IMPLANT
BNDG ELASTIC 6X5.8 VLCR STR LF (GAUZE/BANDAGES/DRESSINGS) ×1 IMPLANT
BNDG ESMARCH 6 X 12 STRL LF (GAUZE/BANDAGES/DRESSINGS) ×1
BNDG ESMARCH 6X12 STRL LF (GAUZE/BANDAGES/DRESSINGS) ×1 IMPLANT
CATH ROBINSON RED A/P 12FR (CATHETERS) IMPLANT
CHLORAPREP W/TINT 26 (MISCELLANEOUS) ×1 IMPLANT
COLLECTOR GRAFT TISSUE (SYSTAGENIX WOUND MANAGEMENT)
CUFF TOURN SGL QUICK 24 (TOURNIQUET CUFF)
CUFF TOURN SGL QUICK 34 (TOURNIQUET CUFF)
CUFF TRNQT CYL 24X4X16.5-23 (TOURNIQUET CUFF) IMPLANT
CUFF TRNQT CYL 34X4.125X (TOURNIQUET CUFF) IMPLANT
CUP MEDICINE 2OZ PLAST GRAD ST (MISCELLANEOUS) ×1 IMPLANT
DRAPE ARTHRO LIMB 89X125 STRL (DRAPES) ×1 IMPLANT
DRAPE IMP U-DRAPE 54X76 (DRAPES) ×1 IMPLANT
ELECT REM PT RETURN 9FT ADLT (ELECTROSURGICAL) ×1
ELECTRODE REM PT RTRN 9FT ADLT (ELECTROSURGICAL) ×1 IMPLANT
GAUZE SPONGE 4X4 12PLY STRL (GAUZE/BANDAGES/DRESSINGS) ×1 IMPLANT
GAUZE SPONGE 4X4 16PLY XRAY LF (GAUZE/BANDAGES/DRESSINGS) IMPLANT
GAUZE XEROFORM 1X8 LF (GAUZE/BANDAGES/DRESSINGS) IMPLANT
GLOVE BIO SURGEON STRL SZ8 (GLOVE) ×2 IMPLANT
GLOVE SURG UNDER LTX SZ8 (GLOVE) ×1 IMPLANT
GOWN STRL REUS W/ TWL LRG LVL3 (GOWN DISPOSABLE) ×1 IMPLANT
GOWN STRL REUS W/ TWL XL LVL3 (GOWN DISPOSABLE) ×2 IMPLANT
GOWN STRL REUS W/TWL LRG LVL3 (GOWN DISPOSABLE) ×1
GOWN STRL REUS W/TWL XL LVL3 (GOWN DISPOSABLE) ×2
IV LACTATED RINGER IRRG 3000ML (IV SOLUTION) ×1
IV LR IRRIG 3000ML ARTHROMATIC (IV SOLUTION) ×1 IMPLANT
KIT TURNOVER KIT A (KITS) ×1 IMPLANT
MANIFOLD NEPTUNE II (INSTRUMENTS) ×2 IMPLANT
NDL HYPO 21X1.5 SAFETY (NEEDLE) ×1 IMPLANT
NEEDLE HYPO 21X1.5 SAFETY (NEEDLE) ×1 IMPLANT
PACK ARTHROSCOPY KNEE (MISCELLANEOUS) ×1 IMPLANT
PAD ABD DERMACEA PRESS 5X9 (GAUZE/BANDAGES/DRESSINGS) IMPLANT
SLEEVE REMOTE CONTROL 5X12 (DRAPES) IMPLANT
SUT PROLENE 3 0 PS 2 (SUTURE) IMPLANT
SUT PROLENE 4 0 PS 2 18 (SUTURE) ×1 IMPLANT
SUT VIC AB 2-0 CT1 27 (SUTURE) ×1
SUT VIC AB 2-0 CT1 TAPERPNT 27 (SUTURE) IMPLANT
SYR 30ML LL (SYRINGE) ×1 IMPLANT
SYR 50ML LL SCALE MARK (SYRINGE) ×1 IMPLANT
SYR TOOMEY 50ML (SYRINGE) IMPLANT
TISSUE GRAFT COLLECTOR (SYSTAGENIX WOUND MANAGEMENT) IMPLANT
TRAP FLUID SMOKE EVACUATOR (MISCELLANEOUS) ×1 IMPLANT
TUBING INFLOW SET DBFLO PUMP (TUBING) ×1 IMPLANT
WAND WEREWOLF FLOW 90D (MISCELLANEOUS) ×1 IMPLANT
WATER STERILE IRR 500ML POUR (IV SOLUTION) ×1 IMPLANT

## 2022-12-25 NOTE — H&P (Signed)
History of Present Illness: Adrian Hart is a 72 y.o. male who presents today for repeat evaluation status post a right knee arthroscopy. The patient underwent a right knee extensive arthroscopic synovectomy with partial lateral meniscectomy and excision of loose body of the right knee, surgery was performed Dr. Roland Rack on 12/20/2022. Upon returning home the patient flex his knee and noticed a significant amount of bloody drainage coming from the medial portal site of the right knee and contact orthopedics for repeat evaluation. At his initial evaluation last Friday, 12/21/2022 skin examination of the right knee did reveal moderate bloody drainage from the medial portal site however the stitches were intact without any evidence of loosening. The patient was placed in a knee immobilizer, a wound VAC was applied to the medial portal and a bulky dressing was applied to the right knee and he was instructed to follow-up today for repeat evaluation. The patient presents today stating that he has kept his knee straight throughout the weekend, he denies any falls or trauma affecting the right knee. The patient denies any fevers or chills or significant pain in the right knee. He reports an aching discomfort in the right knee which she reports is a 3 out of 10. He presents today for wound VAC removal, he states that the wound that yesterday began to beep, he was able to apply more dressings around the wound VAC and he did not notice any leaks. He does report some moderate bloody drainage into the wound VAC. The patient denies any personal history of heart attack, stroke or asthma. He does have a history of COPD. He is not a diabetic. He denies any history of blood clots.  Past Medical History: Cataract cortical, senile  Right eye October 2017  COPD (chronic obstructive pulmonary disease) (CMS-HCC)  Coronary artery disease involving native coronary artery 09/04/2018  GAD (generalized anxiety disorder) 03/30/2022  GERD  (gastroesophageal reflux disease)  PONV (postoperative nausea and vomiting)  PONV, "hangover for 3 days"  Post-traumatic osteoarthritis of left knee 12/10/2016  Prostate cancer (CMS-HCC) 09/2018   Past Surgical History: Left knee arthroscopy 1995  COLONOSCOPY 07/29/2002  Int Hemorrhoids, Diverticulosis: CBF 07/2012; Sch'ed 09/2017 then postponed d/t knee replacement per RTE; CBF 03/2018; Recall Ltr mailed 01/31/2018 (dh)  HERNIA REPAIR 2016  EXTRACTION CATARACT EXTRACAPSULAR W/INSERTION INTRAOCULAR PROSTHESIS Right 07/13/2015  Procedure: -R- LRI Karin Lieu EXTRACTION CATARACT EXTRACAPSULAR WITH PHACO WITH INSERTION INTRAOCULAR PROSTHESIS; Surgeon: Netta Corrigan, MD; Location: [facility] OR; Service: Ophthalmology; Laterality: Right; LRI/comx [place] NO LENSX PER MICHELLE / 8/3 LR  CATARACT EXTRACTION 09/2016  Right unicondylar knee arthroplasty. Right 09/05/2017 (Dr. Roland Rack)  COLONOSCOPY 05/02/2018  Adenomatous Polyps: CBF 04/2021  Left unicondylar knee arthroplasty. Left 09/01/2019 (Dr. Roland Rack)  Extensive arthroscopic synovectomy with partial lateral meniscectomy and excision of loose body, right knee. Right 12/20/2022 (Dr. Roland Rack)  [place] ? (Dr. Raylene Everts @ [facility])  KNEE ARTHROSCOPY  TONSILLECTOMY as a child   Past Family History: Macular degeneration Mother  Glaucoma Neg Hx  Anesthesia problems Neg Hx   Medications: escitalopram oxalate (LEXAPRO) 20 MG tablet Take 1 tablet (20 mg total) by mouth once daily 30 tablet 5  ibuprofen (MOTRIN) 200 MG tablet Take 200 mg by mouth as needed for Pain  omeprazole (PRILOSEC) 40 MG DR capsule Take 1 capsule (40 mg total) by mouth once daily 30 capsule 11  oxyCODONE (ROXICODONE) 5 MG immediate release tablet Take 1-2 tablets (5-10 mg total) by mouth every 4 (four) hours as needed for Pain 20  tablet 0  psyllium husk/aspartame (NATURAL PSYLLIUM FIBER ORAL) Take 1 tablet by mouth once daily.  rosuvastatin (CRESTOR) 10 MG tablet Take 1  tablet (10 mg total) by mouth once daily 90 tablet 3  tadalafiL (CIALIS) 20 MG tablet Take 1 tablet by mouth once daily as needed  traZODone (DESYREL) 50 MG tablet Take 1 tablet (50 mg total) by mouth at bedtime as needed for Sleep 30 tablet 11  saw palmetto 500 MG capsule Take 1 capsule by mouth 3 (three) times daily with meals (Patient not taking: Reported on 10/01/2022)   Allergies: Mussels - Vomiting and Flu-like symptoms   Review of Systems:  A comprehensive 14 point ROS was performed, reviewed by me today, and the pertinent orthopaedic findings are documented in the HPI.  Physical Exam: BP 122/78  Ht 180.3 cm ('5\' 11"'$ )  Wt 93 kg (205 lb)  BMI 28.59 kg/m  General/Constitutional: The patient appears to be well-nourished, well-developed, and in no acute distress. Neuro/Psych: Normal mood and affect, oriented to person, place and time. Eyes: Non-icteric. Pupils are equal, round, and reactive to light, and exhibit synchronous movement. ENT: Unremarkable. Lymphatic: No palpable adenopathy. Respiratory: Lungs clear to auscultation, Normal chest excursion, No wheezes, and Non-labored breathing Cardiovascular: Regular rate and rhythm. No murmurs. and No edema, swelling or tenderness, except as noted in detailed exam. Integumentary: No impressive skin lesions present, except as noted in detailed exam. Musculoskeletal: Unremarkable, except as noted in detailed exam.  Right knee exam: The patient presents today using a cane for assistance with ambulation, the Ace wrap is still applied to the right knee. He is not wearing his knee immobilizer at today's visit. The Ace wrap was removed at today's visit, the wound VAC along the medial aspect the right knee is intact with the seal intact as well. There does appear to be some drainage noted into the wound VAC foam in addition to the wound VAC canister, no purulent material is identified. Upon removal the wound VAC there is a gentle bloody ooze that  is noticed from the medial portal site, no drainage noted from the lateral portal site. The patient was instructed to very gently flex his knee, upon doing so a significant amount of hematoma was evacuated through the medial portal site and a significant amount of bloody drainage began to be produced from the medial portal site. The patient was able to perform a straight leg raise. He is intact light touch on the right lower extremity. The patient denies any significant tenderness with palpation along the medial lateral joint line at today's visit. When he initially presented the patient did have a moderate effusion however after the drainage noted from the medial portal site the effusion had significantly improved. A bulky dressing was reapplied to the right knee prior to a fresh Ace wrap.  Imaging: None.  Impression: 1. Wound dehiscence status post arthroscopic debridement with excision of loose body, right knee. 2.  Status post prior right partial knee replacement.  Plan:  1. Treatment options were discussed today with the patient. 2. The patient continues to have a significant mount of bloody, hematoma drainage from the medial portal site at today's visit, there is no evidence for infection at this time. 3. After discussion with Dr. Roland Rack and discussion with the patient, the risk and benefits of a repeat procedure were discussed in detail. After discussion of the risk and benefits the patient would like to proceed with surgical intervention. 4. The patient will be scheduled for  a repeat right knee arthroscopy with irrigation debridement and wound closure with Dr. Roland Rack on 12/25/2022. 5. This document will serve as a surgical history and physical for the patient. 6. The patient will follow-up per send postop protocol at this time. He was instructed to keep his knee locked in extension until surgery tomorrow.  The procedure was discussed with the patient, as were the potential risks (including  bleeding, infection, nerve and/or blood vessel injury, persistent or recurrent pain, failure of the repair, progression of arthritis, need for further surgery, blood clots, strokes, heart attacks and/or arhythmias, pneumonia, etc.) and benefits. The patient states his understanding and wishes to proceed.    H&P reviewed and patient re-examined. No changes.

## 2022-12-25 NOTE — Anesthesia Preprocedure Evaluation (Signed)
Anesthesia Evaluation  Patient identified by MRN, date of birth, ID band Patient awake    Reviewed: Allergy & Precautions, NPO status , Patient's Chart, lab work & pertinent test results  History of Anesthesia Complications (+) PONV and history of anesthetic complications  Airway Mallampati: III  TM Distance: <3 FB Neck ROM: full    Dental  (+) Chipped   Pulmonary neg shortness of breath, COPD, former smoker   Pulmonary exam normal        Cardiovascular hypertension, (-) angina + CAD  Normal cardiovascular exam     Neuro/Psych   Anxiety     negative neurological ROS  negative psych ROS   GI/Hepatic Neg liver ROS,GERD  Controlled,,  Endo/Other  negative endocrine ROS    Renal/GU      Musculoskeletal   Abdominal   Peds  Hematology negative hematology ROS (+)   Anesthesia Other Findings Past Medical History: No date: Anxiety No date: Arthritis No date: COPD (chronic obstructive pulmonary disease) (HCC) No date: Coronary artery disease No date: GERD (gastroesophageal reflux disease)     Comment:  PT TAKES APPLE CIDER VINEGAR  No date: Hypertension     Comment:  borderline pressures.  takes no meds No date: PONV (postoperative nausea and vomiting) No date: Prostate cancer ([place])     Comment:   needs no treatment. still on observation No date: Thoracic aortic atherosclerosis [facility])  Past Surgical History: 2003: COLONOSCOPY     Comment:  Dr Alveta Heimlich 05/02/2018: COLONOSCOPY WITH PROPOFOL; N/A     Comment:  Procedure: COLONOSCOPY WITH PROPOFOL;  Surgeon: Manya Silvas, MD;  Location: [facility] ENDOSCOPY;  Service:               Endoscopy;  Laterality: N/A; No date: EYE SURGERY; Right     Comment:  cataract 1996: FLEXIBLE SIGMOIDOSCOPY     Comment:  Dr Bary Castilla 04-20-04: HEMORRHOID SURGERY     Comment:  Dr Bary Castilla 09/27/2015: HERNIA REPAIR     Comment:  Umbilical, primary repair 12/20/2022: KNEE  ARTHROSCOPY; Right     Comment:  Procedure: RIGHT KNEE ARTHROSCOPY WITH DEBRIDEMENT,               PARTIAL LATERAL MENISCECTOMY, AND EXCISION OF LOOSE BODY               RIGHT KNEE.;  Surgeon: Corky Mull, MD;  Location: [facility]              ORS;  Service: Orthopedics;  Laterality: Right; 1995: KNEE SURGERY; Left     Comment:  arthroscopy 09/05/2017: PARTIAL KNEE ARTHROPLASTY; Right     Comment:  Procedure: UNICOMPARTMENTAL KNEE;  Surgeon: Corky Mull, MD;  Location: [facility] ORS;  Service: Orthopedics;                Laterality: Right; 09/01/2019: PARTIAL KNEE ARTHROPLASTY; Left     Comment:  Procedure: UNICOMPARTMENTAL KNEE;  Surgeon: Corky Mull, MD;  Location: [facility] ORS;  Service: Orthopedics;                Laterality: Left; No date: TONSILLECTOMY 19/37/9024: UMBILICAL HERNIA REPAIR; N/A     Comment:  Procedure: HERNIA REPAIR UMBILICAL ADULT;  Surgeon:  Robert Bellow, MD;  Location: [facility] ORS;  Service:               General;  Laterality: N/A; 07/28/2020: XI ROBOTIC ASSISTED INGUINAL HERNIA REPAIR WITH MESH; Left     Comment:  Procedure: XI ROBOTIC ASSISTED INGUINAL HERNIA REPAIR               WITH MESH;  Surgeon: Jules Husbands, MD;  Location: [facility]               ORS;  Service: General;  Laterality: Left;  BMI    Body Mass Index: 28.59 kg/m      Reproductive/Obstetrics negative OB ROS                             Anesthesia Physical Anesthesia Plan  ASA: 3  Anesthesia Plan: General LMA   Post-op Pain Management:    Induction: Intravenous  PONV Risk Score and Plan: Dexamethasone, Ondansetron, Midazolam and Treatment may vary due to age or medical condition  Airway Management Planned: LMA  Additional Equipment:   Intra-op Plan:   Post-operative Plan: Extubation in OR  Informed Consent: I have reviewed the patients History and Physical, chart, labs and discussed the procedure including the risks,  benefits and alternatives for the proposed anesthesia with the patient or authorized representative who has indicated his/her understanding and acceptance.     Dental Advisory Given  Plan Discussed with: Anesthesiologist, CRNA and Surgeon  Anesthesia Plan Comments: (Patient consented for risks of anesthesia including but not limited to:  - adverse reactions to medications - damage to eyes, teeth, lips or other oral mucosa - nerve damage due to positioning  - sore throat or hoarseness - Damage to heart, brain, nerves, lungs, other parts of body or loss of life  Patient voiced understanding.)       Anesthesia Quick Evaluation

## 2022-12-25 NOTE — Anesthesia Procedure Notes (Signed)
Procedure Name: LMA Insertion Date/Time: 12/25/2022 12:20 PM  Performed by: Demetrius Charity, CRNAPre-anesthesia Checklist: Patient identified, Patient being monitored, Timeout performed, Emergency Drugs available and Suction available Patient Re-evaluated:Patient Re-evaluated prior to induction Oxygen Delivery Method: Circle system utilized Preoxygenation: Pre-oxygenation with 100% oxygen Induction Type: IV induction Ventilation: Mask ventilation without difficulty LMA: LMA inserted LMA Size: 4.0 Tube type: Oral Number of attempts: 1 Placement Confirmation: positive ETCO2 and breath sounds checked- equal and bilateral Tube secured with: Tape Dental Injury: Teeth and Oropharynx as per pre-operative assessment

## 2022-12-25 NOTE — Discharge Instructions (Addendum)
Orthopedic discharge instructions: Keep dressing dry and intact.  May shower after dressing changed on post-op day #4 (Saturday).  Cover staples/sutures with Band-Aids after drying off. Apply ice frequently to knee. Take ibuprofen 600-800 mg TID OR Aleve 2 tabs BID with meals for 3-5 days, then as necessary. Take pain medication as prescribed or ES Tylenol when needed.  May weight-bear as tolerated - use crutches, cane, or walker as needed. Follow-up in 10-14 days or as scheduled.    AMBULATORY SURGERY  DISCHARGE INSTRUCTIONS   The drugs that you were given will stay in your system until tomorrow so for the next 24 hours you should not:  Drive an automobile Make any legal decisions Drink any alcoholic beverage  You may resume regular meals tomorrow.  Today it is better to start with liquids and gradually work up to solid foods.  You may eat anything you prefer, but it is better to start with liquids, then soup and crackers, and gradually work up to solid foods.  Please notify your doctor immediately if you have any unusual bleeding, trouble breathing, redness and pain at the surgery site, drainage, fever, or pain not relieved by medication.  Additional Instructions:     KEEP THE GREEN ARMBAND ON FOR 4 DAYS  Please contact your physician with any problems or Same Day Surgery at [phone], Monday through Friday 6 am to 4 pm, or [place] at [facility] number at [phone].

## 2022-12-25 NOTE — Anesthesia Postprocedure Evaluation (Signed)
Anesthesia Post Note  Patient: Adrian Hart  Procedure(s) Performed: RIGHT KNEE ARTHROSCOPY WITH IRRIGATION AND DEBRIDEMENT. (Right: Knee)  Patient location during evaluation: PACU Anesthesia Type: General Level of consciousness: awake and alert Pain management: pain level controlled Vital Signs Assessment: post-procedure vital signs reviewed and stable Respiratory status: spontaneous breathing, nonlabored ventilation, respiratory function stable and patient connected to nasal cannula oxygen Cardiovascular status: blood pressure returned to baseline and stable Postop Assessment: no apparent nausea or vomiting Anesthetic complications: no   No notable events documented.   Last Vitals:  Vitals:   12/25/22 1345 12/25/22 1412  BP: (!) 146/94 (!) 174/94  Pulse: 100 89  Resp: 20 18  Temp: (!) 36.2 C 36.4 C  SpO2: 100% 97%    Last Pain:  Vitals:   12/25/22 1412  TempSrc: Temporal  PainSc: 3                  Precious Haws Tranquilino Fischler

## 2022-12-25 NOTE — Op Note (Addendum)
12/25/2022  1:16 PM  Patient:   Adrian Hart  Pre-Op Diagnosis:   1.  Postoperative hemarthrosis with wound dehiscence status post right knee arthroscopy with extensive synovectomy and partial lateral meniscectomy. 2.  Status post prior right partial knee replacement.  Postoperative diagnosis:   Same  Procedure:   Arthroscopic irrigation debridement of right knee with primary repair of right knee wounds.  Surgeon:   Pascal Lux, MD  Anesthesia:   General LMA  Findings:   As above.   Complications:   None  EBL:   10 cc.  Total fluids:   600 cc of crystalloid.  Tourniquet time:   None  Drains:   None  Closure:   3-0 Prolene interrupted sutures.  Brief clinical note:   The patient is a 72 year old male who is now 5 days status post a right knee arthroscopy with extensive synovectomy and partial lateral meniscectomy. The patient's postoperative course has been complicated by persistent bloody drainage emanating from his medial portal site which had to be enlarged at the time of this for surgery to permit removal of a large loose body. His symptoms have persisted despite the application of bulky compressive dressings and a Wound Vac. The patient presents at this time for arthroscopic irrigation and debridement with repeat primary closure of the portal sites.  Procedure:   The patient was brought into the operating room and lain in the supine position. After adequate general laryngeal mask anesthesia was obtained, the right lower extremity was prepped with ChloraPrep solution before being draped sterilely. Preoperative antibiotics were administered. A timeout was performed to verify the appropriate surgical site before the previously placed sutures were removed. The camera was placed in the anterolateral portal and instrumentation performed through the anteromedial portal.   The knee was sequentially examined beginning in the suprapatellar pouch, then progressing to the  patellofemoral space, the medial gutter and compartment, the notch, and finally the lateral compartment and gutter. The findings were as described above. Abundant hematoma and bloody fluid was evacuated using the full-radius resector before the ArthroCare wand was introduced and used to obtain hemostasis. The wound knee was irrigated with a total of 3 L of sterile lactated Ringer's to minimize the risk of infection, given that the patient has a prior partial knee replacement in place. The instruments were removed from the joint after suctioning the excess fluid.   The portal sites were closed using 2-0 Vicryl interrupted sutures for the subcutaneous tissues before the skin was closed using 3-0 Prolene interrupted sutures. A sterile bulky dressing was applied to the knee. The patient was then awakened, extubated, and returned to the recovery room in satisfactory condition after tolerating the procedure well.

## 2022-12-25 NOTE — Transfer of Care (Signed)
Immediate Anesthesia Transfer of Care Note  Patient: Adrian Hart  Procedure(s) Performed: RIGHT KNEE ARTHROSCOPY WITH IRRIGATION AND DEBRIDEMENT. (Right: Knee)  Patient Location: PACU  Anesthesia Type:General  Level of Consciousness: awake, alert , and oriented  Airway & Oxygen Therapy: Patient Spontanous Breathing and Patient connected to face mask oxygen  Post-op Assessment: Report given to RN and Post -op Vital signs reviewed and stable  Post vital signs: Reviewed and stable  Last Vitals:  Vitals Value Taken Time  BP    Temp    Pulse    Resp    SpO2      Last Pain:  Vitals:   12/25/22 1141  TempSrc: Temporal  PainSc: 2          Complications: No notable events documented.

## 2022-12-26 ENCOUNTER — Encounter: Payer: Self-pay | Admitting: Surgery

## 2022-12-26 ENCOUNTER — Ambulatory Visit (INDEPENDENT_AMBULATORY_CARE_PROVIDER_SITE_OTHER): Payer: HMO | Admitting: Urology

## 2022-12-26 ENCOUNTER — Telehealth: Payer: Self-pay

## 2022-12-26 DIAGNOSIS — R339 Retention of urine, unspecified: Secondary | ICD-10-CM

## 2022-12-26 DIAGNOSIS — C61 Malignant neoplasm of prostate: Secondary | ICD-10-CM | POA: Diagnosis not present

## 2022-12-26 NOTE — Telephone Encounter (Signed)
Patient called stating that he had knee surgery yesterday 12/25/22, since coming home yesterday he has not been able to urinate except for a few drops coming out. He has pain and pressure in his bladder. Wants to know what to do. I placed patient on hold spoke with Zara Council who advised to let Dr Roland Rack know this happened and come to our office now for evaluation.  I called and left a message about calling us back and to relay this information. Patient walked in for evaluation.

## 2022-12-26 NOTE — Progress Notes (Signed)
12/26/2022 4:32 PM   Adrian Hart May 11, 1951 [id number]  Referring provider: Kirk Ruths, MD [place] [facility] [place],  [place] [address]  Urological history: 1.  Low risk prostate cancer -PSA (05/2022) 2.5 -cT1c adenocarcinoma prostate low risk  -Biopsy 08/2018; abnormal DRE; PSA 2.7; 66 g prostate  -4/12 cores positive Gleason 3+3 (LLB, LLM, LM, LA)  -1-25% involvement  -Prostate MRI (12/18/18) w/2 PI-RADS 4 lesions left base and left mid gland concerning for high-grade carcinoma, no adenopathy, capsule intact, normal seminal vesicles  -Fusion Biopsy 01/2019: PI-RADS 4 lesions were benign, left side of biopsy positive Gleason 3+3  -Elected surveillance   2. BPH w/ LU TS  3. ED -Contributing factors of age, prostate cancer, BPH, coronary artery disease, elevated hemoglobin A1c, hyperlipidemia, COPD, anxiety, smoking history -Tadalafil 20 mg, on-demand dosing    Chief Complaint  Patient presents with   Urinary Retention    HPI: Adrian Hart is a 72 y.o.male who presents today for inability to urinate.    He underwent arthroscopic irrigation and debridement of his right knee on December 25, 2022.  Since his procedure, he is only been dribbling and this morning he went into frank urinary retention.  Bladder scan noted a residual over 1000 mL.  We placed a Foley catheter and a PVR 1400 mL of clear yellow urine was obtained. (See procedure note)  PMH: Past Medical History:  Diagnosis Date   Anxiety    Arthritis    COPD (chronic obstructive pulmonary disease) ([place])    Coronary artery disease    GERD (gastroesophageal reflux disease)    PT TAKES APPLE CIDER VINEGAR    Hypertension    borderline pressures.  takes no meds   PONV (postoperative nausea and vomiting)    Prostate cancer ([place])     needs no treatment. still on observation   Thoracic aortic atherosclerosis [facility])     Surgical History: Past Surgical History:  Procedure  Laterality Date   COLONOSCOPY  2003   Dr Alveta Heimlich   COLONOSCOPY WITH PROPOFOL N/A 05/02/2018   Procedure: COLONOSCOPY WITH PROPOFOL;  Surgeon: Manya Silvas, MD;  Location: [facility] ENDOSCOPY;  Service: Endoscopy;  Laterality: N/A;   EYE SURGERY Right    cataract   FLEXIBLE SIGMOIDOSCOPY  1996   Dr Bary Castilla   HEMORRHOID SURGERY  04-20-04   Dr Bary Castilla   HERNIA REPAIR  89/37/3428   Umbilical, primary repair   KNEE ARTHROSCOPY Right 12/20/2022   Procedure: RIGHT KNEE ARTHROSCOPY WITH DEBRIDEMENT, PARTIAL LATERAL MENISCECTOMY, AND EXCISION OF LOOSE BODY RIGHT KNEE.;  Surgeon: Corky Mull, MD;  Location: [facility] ORS;  Service: Orthopedics;  Laterality: Right;   KNEE ARTHROSCOPY Right 12/25/2022   Procedure: RIGHT KNEE ARTHROSCOPY WITH IRRIGATION AND DEBRIDEMENT.;  Surgeon: Corky Mull, MD;  Location: [facility] ORS;  Service: Orthopedics;  Laterality: Right;   KNEE SURGERY Left 1995   arthroscopy   PARTIAL KNEE ARTHROPLASTY Right 09/05/2017   Procedure: UNICOMPARTMENTAL KNEE;  Surgeon: Corky Mull, MD;  Location: [facility] ORS;  Service: Orthopedics;  Laterality: Right;   PARTIAL KNEE ARTHROPLASTY Left 09/01/2019   Procedure: UNICOMPARTMENTAL KNEE;  Surgeon: Corky Mull, MD;  Location: [facility] ORS;  Service: Orthopedics;  Laterality: Left;   TONSILLECTOMY     UMBILICAL HERNIA REPAIR N/A 09/27/2015   Procedure: HERNIA REPAIR UMBILICAL ADULT;  Surgeon: Robert Bellow, MD;  Location: [facility] ORS;  Service: General;  Laterality: N/A;   XI ROBOTIC ASSISTED  INGUINAL HERNIA REPAIR WITH MESH Left 07/28/2020   Procedure: XI ROBOTIC ASSISTED INGUINAL HERNIA REPAIR WITH MESH;  Surgeon: Jules Husbands, MD;  Location: [facility] ORS;  Service: General;  Laterality: Left;    Home Medications:  Allergies as of 12/26/2022       Reactions   Fish-derived Products Nausea And Vomiting   Reaction to mussells only        Medication List        Accurate as of December 26, 2022 11:59 PM. If you have any questions, ask your nurse  or doctor.          escitalopram 20 MG tablet Commonly known as: LEXAPRO Take 20 mg by mouth daily.   Ginseng 100 MG Caps Take 100 mg by mouth daily.   ibuprofen 200 MG tablet Commonly known as: ADVIL Take 200 mg by mouth every 6 (six) hours as needed.   MACA PO Take by mouth.   Multi-Vitamins Tabs Take 1 tablet by mouth daily.   naproxen sodium 220 MG tablet Commonly known as: ALEVE Take 220 mg by mouth daily as needed.   omeprazole 40 MG capsule Commonly known as: PRILOSEC Take 40 mg by mouth daily.   oxyCODONE 5 MG immediate release tablet Commonly known as: Roxicodone Take 1-2 tablets (5-10 mg total) by mouth every 4 (four) hours as needed for moderate pain or severe pain.   PSYLLIUM PO Take 3 capsules by mouth 2 (two) times daily.   rosuvastatin 10 MG tablet Commonly known as: CRESTOR Take 10 mg by mouth daily.   tadalafil 20 MG tablet Commonly known as: CIALIS Take 1 tablet (20 mg total) by mouth daily as needed for erectile dysfunction.   traZODone 50 MG tablet Commonly known as: DESYREL Take 25 mg by mouth at bedtime as needed for sleep.        Allergies:  Allergies  Allergen Reactions   Fish-Derived Products Nausea And Vomiting    Reaction to mussells only    Family History: Family History  Problem Relation Age of Onset   Diabetes Mother    Colon cancer Maternal Aunt    Prostate cancer Maternal Uncle    Bladder Cancer Neg Hx    Kidney cancer Neg Hx     Social History:  reports that he quit smoking about 15 years ago. His smoking use included cigarettes. He has a 30.00 pack-year smoking history. He has never used smokeless tobacco. He reports that he does not drink alcohol and does not use drugs.  ROS: Pertinent ROS in HPI  Physical Exam: Constitutional:  Well nourished. Alert and oriented, No acute distress. HEENT: [place] AT, moist mucus membranes.  Trachea midline Cardiovascular: No clubbing, cyanosis, or edema. Respiratory: Normal  respiratory effort, no increased work of breathing. Neurologic: Grossly intact, no focal deficits, moving all 4 extremities. Psychiatric: Normal mood and affect.  Laboratory Data: Serum creatinine (07/2022) 0.8 Total cholesterol (07/2022) 95 Hemoglobin A1c (07/2022) 6.0  Component     Latest Ref Rng 05/18/2022  Specific Gravity, UA     1.005 - 1.030  1.025   pH, UA     5.0 - 7.5  6.0   Color, UA     Yellow  Yellow   Appearance Ur     Clear  Clear   Leukocytes,UA     Negative  Negative   Protein,UA     Negative/Trace  Negative   Glucose, UA     Negative  Negative   Ketones, UA  Negative  Negative   RBC, UA     Negative  Trace !   Bilirubin, UA     Negative  Negative   Urobilinogen, Ur     0.2 - 1.0 mg/dL 0.2   Nitrite, UA     Negative  Negative   Microscopic Examination See below:     Legend: ! Abnormal  Component     Latest Ref Rng 05/18/2022  WBC, UA     0 - 5 /hpf 0-5   Epithelial Cells (non renal)     0 - 10 /hpf 0-10   Bacteria, UA     None seen/Few  None seen   RBC, Urine     0 - 2 /hpf 0-2    Component     Latest Ref Rng 05/15/2022  Prostate Specific Ag, Serum     0.0 - 4.0 ng/mL 2.5   I have reviewed the labs.   Pertinent Imaging:  12/30/22 16:26  Scan Result >1000 mL    Assessment & Plan:    1. Acute urinary retention:    - Hopefully secondary to postoperative state - foley catheter placed -Explained to the patient that we need to keep the Foley catheter in for 1 week to allow the cross fibers of the bladder to heal as they likely been stretched due to the large amount of urine contained in his bladder -Explained that he may experience gross hematuria and that is due to the injury to the bladder due to the large amount of urine that had just been expressed from his bladder -Encouraged him to keep his fluid intake to a level that will keep his urine a clear pale yellow and to resume a normal diet to avoid any effective postobstructive  diuresis -Reviewed return precautions (Foley catheter not draining, passage of large clots, persistent gross hematuria, fever, chills or suprapubic pain)  2. Prostate cancer -Low-grade prostate cancer on active surveillance -Has an appointment in June for PSA and office visit with Dr. Bernardo Heater  Return in about 1 week (around 01/02/2023) for TOV and PVR .  These notes generated with voice recognition software. I apologize for typographical errors.  [place], [place] [address]  [place], [place] [address] [phone]

## 2022-12-26 NOTE — Progress Notes (Signed)
Simple Catheter Placement  Due to urinary retention patient is present today for a foley cath placement.  Patient was cleaned and prepped in a sterile fashion with betadine and 2% lidocaine jelly was instilled into the urethra. A 16 FR foley catheter was inserted, urine return was noted  1440m, urine was yellow in color.  The balloon was filled with 10cc of sterile water.  A leg bag was attached for drainage. Patient was also given a night bag to take home and was given instruction on how to change from one bag to another.  Patient was given instruction on proper catheter care.  Patient tolerated well, no complications were noted   Performed by: CElberta Leatherwood CMA  Additional notes/ Follow up: 1 week voiding trial

## 2022-12-30 ENCOUNTER — Encounter: Payer: Self-pay | Admitting: Urology

## 2022-12-30 LAB — BLADDER SCAN AMB NON-IMAGING: Scan Result: >1000

## 2022-12-31 ENCOUNTER — Encounter: Payer: Self-pay | Admitting: Ophthalmology

## 2022-12-31 DIAGNOSIS — Z09 Encounter for follow-up examination after completed treatment for conditions other than malignant neoplasm: Secondary | ICD-10-CM | POA: Diagnosis not present

## 2022-12-31 DIAGNOSIS — Z9889 Other specified postprocedural states: Secondary | ICD-10-CM | POA: Diagnosis not present

## 2022-12-31 DIAGNOSIS — Z960 Presence of urogenital implants: Secondary | ICD-10-CM | POA: Diagnosis not present

## 2023-01-01 NOTE — Progress Notes (Unsigned)
01/02/2023 3:37 PM   Adrian Hart 1951/08/21 [id number]  Referring provider: Kirk Ruths, MD [place] [facility] [place],  [place] [address]  Urological history: 1.  Low risk prostate cancer -PSA (05/2022) 2.5 -cT1c adenocarcinoma prostate low risk  -Biopsy 08/2018; abnormal DRE; PSA 2.7; 66 g prostate  -4/12 cores positive Gleason 3+3 (LLB, LLM, LM, LA)  -1-25% involvement  -Prostate MRI (12/18/18) w/2 PI-RADS 4 lesions left base and left mid gland concerning for high-grade carcinoma, no adenopathy, capsule intact, normal seminal vesicles  -Fusion Biopsy 01/2019: PI-RADS 4 lesions were benign, left side of biopsy positive Gleason 3+3  -Elected surveillance   2. BPH w/ LU TS  3. ED -Contributing factors of age, prostate cancer, BPH, coronary artery disease, elevated hemoglobin A1c, hyperlipidemia, COPD, anxiety, smoking history -Tadalafil 20 mg, on-demand dosing    Chief Complaint  Patient presents with   Urinary Retention    HPI: Adrian Hart is a 72 y.o.male who presents today for TOV.    His catheter was removed this morning without difficulty.  He has been increasing his fluids all day and unfortunately had only been able to dribble.  His PVR is 849 mL.   PMH: Past Medical History:  Diagnosis Date   Anxiety    Arthritis    COPD (chronic obstructive pulmonary disease) ([facility])    Coronary artery disease    GERD (gastroesophageal reflux disease)    PT TAKES APPLE CIDER VINEGAR    Hypertension    borderline pressures.  takes no meds   PONV (postoperative nausea and vomiting)    also - urinarty retention after knee surgery (12/25/22)   Prostate cancer ([place])     needs no treatment. still on observation   Thoracic aortic atherosclerosis [facility])     Surgical History: Past Surgical History:  Procedure Laterality Date   COLONOSCOPY  2003   Dr Alveta Heimlich   COLONOSCOPY WITH PROPOFOL N/A 05/02/2018   Procedure: COLONOSCOPY WITH PROPOFOL;   Surgeon: Manya Silvas, MD;  Location: [facility] ENDOSCOPY;  Service: Endoscopy;  Laterality: N/A;   EYE SURGERY Right    cataract   FLEXIBLE SIGMOIDOSCOPY  1996   Dr Bary Castilla   HEMORRHOID SURGERY  04-20-04   Dr Bary Castilla   HERNIA REPAIR  98/33/8250   Umbilical, primary repair   KNEE ARTHROSCOPY Right 12/20/2022   Procedure: RIGHT KNEE ARTHROSCOPY WITH DEBRIDEMENT, PARTIAL LATERAL MENISCECTOMY, AND EXCISION OF LOOSE BODY RIGHT KNEE.;  Surgeon: Corky Mull, MD;  Location: [facility] ORS;  Service: Orthopedics;  Laterality: Right;   KNEE ARTHROSCOPY Right 12/25/2022   Procedure: RIGHT KNEE ARTHROSCOPY WITH IRRIGATION AND DEBRIDEMENT.;  Surgeon: Corky Mull, MD;  Location: [facility] ORS;  Service: Orthopedics;  Laterality: Right;   KNEE SURGERY Left 1995   arthroscopy   PARTIAL KNEE ARTHROPLASTY Right 09/05/2017   Procedure: UNICOMPARTMENTAL KNEE;  Surgeon: Corky Mull, MD;  Location: [facility] ORS;  Service: Orthopedics;  Laterality: Right;   PARTIAL KNEE ARTHROPLASTY Left 09/01/2019   Procedure: UNICOMPARTMENTAL KNEE;  Surgeon: Corky Mull, MD;  Location: [facility] ORS;  Service: Orthopedics;  Laterality: Left;   TONSILLECTOMY     UMBILICAL HERNIA REPAIR N/A 09/27/2015   Procedure: HERNIA REPAIR UMBILICAL ADULT;  Surgeon: Robert Bellow, MD;  Location: [facility] ORS;  Service: General;  Laterality: N/A;   XI ROBOTIC ASSISTED INGUINAL HERNIA REPAIR WITH MESH Left 07/28/2020   Procedure: XI ROBOTIC ASSISTED INGUINAL HERNIA REPAIR WITH MESH;  Surgeon: Dahlia Byes,  Marjory Lies, MD;  Location: [facility] ORS;  Service: General;  Laterality: Left;    Home Medications:  Allergies as of 01/02/2023       Reactions   Fish-derived Products Nausea And Vomiting   Reaction to mussells only        Medication List        Accurate as of January 02, 2023  3:37 PM. If you have any questions, ask your nurse or doctor.          escitalopram 20 MG tablet Commonly known as: LEXAPRO Take 20 mg by mouth daily.   Ginseng 100 MG  Caps Take 100 mg by mouth daily.   ibuprofen 200 MG tablet Commonly known as: ADVIL Take 200 mg by mouth every 6 (six) hours as needed.   MACA PO Take by mouth.   Multi-Vitamins Tabs Take 1 tablet by mouth daily.   naproxen sodium 220 MG tablet Commonly known as: ALEVE Take 220 mg by mouth daily as needed.   omeprazole 40 MG capsule Commonly known as: PRILOSEC Take 40 mg by mouth daily.   oxyCODONE 5 MG immediate release tablet Commonly known as: Roxicodone Take 1-2 tablets (5-10 mg total) by mouth every 4 (four) hours as needed for moderate pain or severe pain.   PSYLLIUM PO Take 3 capsules by mouth 2 (two) times daily.   rosuvastatin 10 MG tablet Commonly known as: CRESTOR Take 10 mg by mouth daily.   tadalafil 20 MG tablet Commonly known as: CIALIS Take 1 tablet (20 mg total) by mouth daily as needed for erectile dysfunction. What changed: Another medication with the same name was added. Make sure you understand how and when to take each. Changed by: Zara Council, PA-C   tadalafil 5 MG tablet Commonly known as: CIALIS Take 1 tablet (5 mg total) by mouth daily as needed for erectile dysfunction. What changed: You were already taking a medication with the same name, and this prescription was added. Make sure you understand how and when to take each. Changed by: Zara Council, PA-C   traZODone 50 MG tablet Commonly known as: DESYREL Take 25 mg by mouth at bedtime as needed for sleep.   VITAMIN B 12 PO Take by mouth 3 (three) times a week.   ZINC PO Take by mouth.        Allergies:  Allergies  Allergen Reactions   Fish-Derived Products Nausea And Vomiting    Reaction to mussells only    Family History: Family History  Problem Relation Age of Onset   Diabetes Mother    Colon cancer Maternal Aunt    Prostate cancer Maternal Uncle    Bladder Cancer Neg Hx    Kidney cancer Neg Hx     Social History:  reports that he quit smoking about 15  years ago. His smoking use included cigarettes. He has a 30.00 pack-year smoking history. He has never used smokeless tobacco. He reports that he does not drink alcohol and does not use drugs.  ROS: Pertinent ROS in HPI  Physical Exam: Constitutional:  Well nourished. Alert and oriented, No acute distress. HEENT: [place] AT, moist mucus membranes.  Trachea midline Cardiovascular: No clubbing, cyanosis, or edema. Respiratory: Normal respiratory effort, no increased work of breathing. GU: No CVA tenderness.  No bladder fullness or masses.  Patient with circumcised phallus.  Neurologic: Grossly intact, no focal deficits, moving all 4 extremities. Psychiatric: Normal mood and affect.   Laboratory Data: N/A  Catheter Removal Patient is present  today for a catheter removal.  9 ml of water was drained from the balloon. A 16 FR foley cath was removed from the bladder, no complications were noted. Patient tolerated well.  Performed by: Zara Council, PA-C      Pertinent Imaging:  01/02/23 14:47  Scan Result [id number]     Assessment & Plan:    1. Acute urinary retention:    - Foley catheter removed -Bladder scan indicates retention patient is very comfortable -We discussed replacing indwelling Foley or instructing him in self-catheterization technique and he chose the latter -He was able to pass a 14 French straight catheter without difficulty and obtained a PVR of 900 cc of clear yellow urine -He feels that he can do this at home and I advised him to start with 3 catheterizations daily keeping his PVR is around 200 cc or less -I explained that if he is still having PVRs greater than 200 with 3 caths a day he needs to increase to 4 or if his PVRs are less than 200 he can decrease to 2 times a day and etc. -Also start tadalafil 5 mg daily against an alpha-blocker at this time as he is scheduled for cataract surgery on Monday  2. Prostate cancer -Low-grade prostate cancer on active  surveillance -Has an appointment in June for PSA and office visit with Dr. Bernardo Heater  Return in about 2 weeks (around 01/16/2023) for recheck - I PSS and PVR .  These notes generated with voice recognition software. I apologize for typographical errors.  Royden Purl  [place] [address]  [place], [place] [address] [phone] I spent 30 minutes on the day of the encounter to include pre-visit record review, face-to-face time with the patient, and post-visit ordering of tests.

## 2023-01-02 ENCOUNTER — Ambulatory Visit: Payer: HMO | Admitting: Urology

## 2023-01-02 ENCOUNTER — Encounter: Payer: Self-pay | Admitting: Urology

## 2023-01-02 ENCOUNTER — Ambulatory Visit (INDEPENDENT_AMBULATORY_CARE_PROVIDER_SITE_OTHER): Payer: HMO | Admitting: Urology

## 2023-01-02 VITALS — BP 130/83 | HR 81 | Ht 71.0 in | Wt 206.0 lb

## 2023-01-02 DIAGNOSIS — R339 Retention of urine, unspecified: Secondary | ICD-10-CM

## 2023-01-02 DIAGNOSIS — C61 Malignant neoplasm of prostate: Secondary | ICD-10-CM

## 2023-01-02 LAB — BLADDER SCAN AMB NON-IMAGING: Scan Result: 849

## 2023-01-02 MED ORDER — TADALAFIL 5 MG PO TABS: 5.0000 mg | ORAL_TABLET | Freq: Every day | ORAL | 0 refills | Status: DC | PRN

## 2023-01-02 NOTE — Patient Instructions (Signed)
Follow-up in 2 weeks

## 2023-01-03 ENCOUNTER — Ambulatory Visit (INDEPENDENT_AMBULATORY_CARE_PROVIDER_SITE_OTHER): Payer: HMO | Admitting: Urology

## 2023-01-03 DIAGNOSIS — R339 Retention of urine, unspecified: Secondary | ICD-10-CM

## 2023-01-03 MED ORDER — GEMTESA 75 MG PO TABS: 75.0000 mg | ORAL_TABLET | Freq: Every day | ORAL | 0 refills | Status: DC

## 2023-01-03 NOTE — Progress Notes (Signed)
Simple Catheter Placement  Due to urinary retention patient is present today for a foley cath placement.  Patient was cleaned and prepped in a sterile fashion with betadine and 2% lidocaine jelly was instilled into the urethra. A 16coude foley catheter was inserted, urine return was noted  1468m, urine was yellow in color.  The balloon was filled with 10cc of sterile water.  A leg bag was attached for drainage. Patient was also given a night bag to take home and was given instruction on how to change from one bag to another.  Patient was given instruction on proper catheter care.  Patient tolerated well, no complications were noted   Performed by: CElberta Leatherwood CMA  Additional notes/ Follow up: 1 week voiding trial

## 2023-01-03 NOTE — Discharge Instructions (Signed)

## 2023-01-07 ENCOUNTER — Ambulatory Visit: Payer: HMO | Admitting: Anesthesiology

## 2023-01-07 ENCOUNTER — Encounter
Admission: RE | Disposition: A | Discharge status: Home / Self Care | Payer: Self-pay | Source: Ambulatory Visit | Attending: Ophthalmology

## 2023-01-07 ENCOUNTER — Other Ambulatory Visit: Payer: Self-pay

## 2023-01-07 ENCOUNTER — Encounter: Payer: Self-pay | Admitting: Ophthalmology

## 2023-01-07 ENCOUNTER — Ambulatory Visit
Admission: RE | Admit: 2023-01-07 | Discharge: 2023-01-07 | Disposition: A | Discharge status: Home / Self Care | Payer: HMO | Source: Ambulatory Visit | Attending: Ophthalmology | Admitting: Ophthalmology

## 2023-01-07 DIAGNOSIS — J449 Chronic obstructive pulmonary disease, unspecified: Secondary | ICD-10-CM | POA: Insufficient documentation

## 2023-01-07 DIAGNOSIS — Z96651 Presence of right artificial knee joint: Secondary | ICD-10-CM | POA: Insufficient documentation

## 2023-01-07 DIAGNOSIS — I700 Atherosclerosis of aorta: Secondary | ICD-10-CM | POA: Insufficient documentation

## 2023-01-07 DIAGNOSIS — C61 Malignant neoplasm of prostate: Secondary | ICD-10-CM | POA: Insufficient documentation

## 2023-01-07 DIAGNOSIS — K219 Gastro-esophageal reflux disease without esophagitis: Secondary | ICD-10-CM | POA: Insufficient documentation

## 2023-01-07 DIAGNOSIS — H2512 Age-related nuclear cataract, left eye: Secondary | ICD-10-CM | POA: Insufficient documentation

## 2023-01-07 DIAGNOSIS — F419 Anxiety disorder, unspecified: Secondary | ICD-10-CM | POA: Insufficient documentation

## 2023-01-07 DIAGNOSIS — Z87891 Personal history of nicotine dependence: Secondary | ICD-10-CM | POA: Insufficient documentation

## 2023-01-07 DIAGNOSIS — I10 Essential (primary) hypertension: Secondary | ICD-10-CM | POA: Insufficient documentation

## 2023-01-07 DIAGNOSIS — I2510 Atherosclerotic heart disease of native coronary artery without angina pectoris: Secondary | ICD-10-CM | POA: Insufficient documentation

## 2023-01-07 HISTORY — PX: CATARACT EXTRACTION W/PHACO: SHX586

## 2023-01-07 SURGERY — PHACOEMULSIFICATION, CATARACT, WITH IOL INSERTION
Anesthesia: Monitor Anesthesia Care | Site: Eye | Laterality: Left

## 2023-01-07 MED ORDER — MOXIFLOXACIN HCL 0.5 % OP SOLN
OPHTHALMIC | Status: DC | PRN
  Administered 2023-01-07: .2 mL via OPHTHALMIC

## 2023-01-07 MED ORDER — SIGHTPATH DOSE#1 BSS IO SOLN
INTRAOCULAR | Status: DC | PRN
  Administered 2023-01-07: 95 mL via OPHTHALMIC

## 2023-01-07 MED ORDER — MIDAZOLAM HCL 2 MG/2ML IJ SOLN
INTRAMUSCULAR | Status: DC | PRN
  Administered 2023-01-07: 1 mg via INTRAVENOUS

## 2023-01-07 MED ORDER — SODIUM CHLORIDE 0.9% FLUSH
INTRAVENOUS | Status: DC | PRN
  Administered 2023-01-07: 10 mL via INTRAVENOUS

## 2023-01-07 MED ORDER — LIDOCAINE HCL (PF) 2 % IJ SOLN
INTRAOCULAR | Status: DC | PRN
  Administered 2023-01-07: 4 mL via INTRAOCULAR

## 2023-01-07 MED ORDER — SIGHTPATH DOSE#1 BSS IO SOLN
INTRAOCULAR | Status: DC | PRN
  Administered 2023-01-07: 15 mL via INTRAOCULAR

## 2023-01-07 MED ORDER — LACTATED RINGERS IV SOLN: INTRAVENOUS | Status: DC

## 2023-01-07 MED ORDER — TETRACAINE HCL 0.5 % OP SOLN
1.0000 [drp] | OPHTHALMIC | Status: DC | PRN
Start: 2023-01-07 — End: 2023-01-07
  Administered 2023-01-07 (×3): 1 [drp] via OPHTHALMIC

## 2023-01-07 MED ORDER — FENTANYL CITRATE (PF) 100 MCG/2ML IJ SOLN
INTRAMUSCULAR | Status: DC | PRN
  Administered 2023-01-07: 25 ug via INTRAVENOUS

## 2023-01-07 MED ORDER — ARMC OPHTHALMIC DILATING DROPS
1.0000 | OPHTHALMIC | Status: DC | PRN
  Administered 2023-01-07 (×3): 1 via OPHTHALMIC

## 2023-01-07 MED ORDER — SIGHTPATH DOSE#1 NA HYALUR & NA CHOND-NA HYALUR IO KIT
PACK | INTRAOCULAR | Status: DC | PRN
  Administered 2023-01-07: 1 via OPHTHALMIC

## 2023-01-07 SURGICAL SUPPLY — 15 items
CANNULA ANT/CHMB 27G (MISCELLANEOUS) IMPLANT
CANNULA ANT/CHMB 27GA (MISCELLANEOUS) IMPLANT
CATARACT SUITE SIGHTPATH (MISCELLANEOUS) ×1 IMPLANT
DISSECTOR HYDRO NUCLEUS 50X22 (MISCELLANEOUS) ×1 IMPLANT
FEE CATARACT SUITE SIGHTPATH (MISCELLANEOUS) ×1 IMPLANT
GLOVE SURG GAMMEX PI TX LF 7.5 (GLOVE) ×1 IMPLANT
GLOVE SURG SYN 8.5  E (GLOVE) ×1
GLOVE SURG SYN 8.5 E (GLOVE) ×1 IMPLANT
GLOVE SURG SYN 8.5 PF PI (GLOVE) ×1 IMPLANT
LENS IOL TECNIS EYHANCE 17.0 (Intraocular Lens) IMPLANT
NDL FILTER BLUNT 18X1 1/2 (NEEDLE) ×1 IMPLANT
NEEDLE FILTER BLUNT 18X1 1/2 (NEEDLE) ×1 IMPLANT
SYR 3ML LL SCALE MARK (SYRINGE) ×1 IMPLANT
SYR 5ML LL (SYRINGE) ×1 IMPLANT
WATER STERILE IRR 250ML POUR (IV SOLUTION) ×1 IMPLANT

## 2023-01-07 NOTE — H&P (Signed)
[facility]   Primary Care Physician:  Kirk Ruths, MD Ophthalmologist: Dr. Benay Pillow  Pre-Procedure History & Physical: HPI:  Adrian Hart is a 72 y.o. male here for cataract surgery.   Past Medical History:  Diagnosis Date   Anxiety    Arthritis    COPD (chronic obstructive pulmonary disease) (HCC)    Coronary artery disease    GERD (gastroesophageal reflux disease)    PT TAKES APPLE CIDER VINEGAR    Hypertension    borderline pressures.  takes no meds   PONV (postoperative nausea and vomiting)    also - urinarty retention after knee surgery (12/25/22)   Prostate cancer ([place])     needs no treatment. still on observation   Thoracic aortic atherosclerosis [facility])     Past Surgical History:  Procedure Laterality Date   COLONOSCOPY  2003   Dr Alveta Heimlich   COLONOSCOPY WITH PROPOFOL N/A 05/02/2018   Procedure: COLONOSCOPY WITH PROPOFOL;  Surgeon: Manya Silvas, MD;  Location: [facility] ENDOSCOPY;  Service: Endoscopy;  Laterality: N/A;   EYE SURGERY Right    cataract   FLEXIBLE SIGMOIDOSCOPY  1996   Dr Bary Castilla   HEMORRHOID SURGERY  04-20-04   Dr Bary Castilla   HERNIA REPAIR  40/07/6760   Umbilical, primary repair   KNEE ARTHROSCOPY Right 12/20/2022   Procedure: RIGHT KNEE ARTHROSCOPY WITH DEBRIDEMENT, PARTIAL LATERAL MENISCECTOMY, AND EXCISION OF LOOSE BODY RIGHT KNEE.;  Surgeon: Corky Mull, MD;  Location: [facility] ORS;  Service: Orthopedics;  Laterality: Right;   KNEE ARTHROSCOPY Right 12/25/2022   Procedure: RIGHT KNEE ARTHROSCOPY WITH IRRIGATION AND DEBRIDEMENT.;  Surgeon: Corky Mull, MD;  Location: [facility] ORS;  Service: Orthopedics;  Laterality: Right;   KNEE SURGERY Left 1995   arthroscopy   PARTIAL KNEE ARTHROPLASTY Right 09/05/2017   Procedure: UNICOMPARTMENTAL KNEE;  Surgeon: Corky Mull, MD;  Location: [facility] ORS;  Service: Orthopedics;  Laterality: Right;   PARTIAL KNEE ARTHROPLASTY Left 09/01/2019   Procedure: UNICOMPARTMENTAL KNEE;  Surgeon: Corky Mull, MD;   Location: [facility] ORS;  Service: Orthopedics;  Laterality: Left;   TONSILLECTOMY     UMBILICAL HERNIA REPAIR N/A 09/27/2015   Procedure: HERNIA REPAIR UMBILICAL ADULT;  Surgeon: Robert Bellow, MD;  Location: [facility] ORS;  Service: General;  Laterality: N/A;   XI ROBOTIC ASSISTED INGUINAL HERNIA REPAIR WITH MESH Left 07/28/2020   Procedure: XI ROBOTIC ASSISTED INGUINAL HERNIA REPAIR WITH MESH;  Surgeon: Jules Husbands, MD;  Location: [facility] ORS;  Service: General;  Laterality: Left;    Prior to Admission medications   Medication Sig Start Date End Date Taking? Authorizing Provider  Cyanocobalamin (VITAMIN B 12 PO) Take by mouth 3 (three) times a week.   Yes [provider]  escitalopram (LEXAPRO) 20 MG tablet Take 20 mg by mouth daily. 05/14/22  Yes [provider]  Ginseng 100 MG CAPS Take 100 mg by mouth daily.   Yes [provider]  ibuprofen (ADVIL) 200 MG tablet Take 200 mg by mouth every 6 (six) hours as needed.   Yes [provider]  Maca Root (MACA PO) Take by mouth.   Yes [provider]  Multiple Vitamin (MULTI-VITAMINS) TABS Take 1 tablet by mouth daily.    Yes [provider]  Multiple Vitamins-Minerals (ZINC PO) Take by mouth.   Yes [provider]  naproxen sodium (ALEVE) 220 MG tablet Take 220 mg by mouth daily as needed.   Yes [provider]  omeprazole ([place])  40 MG capsule Take 40 mg by mouth daily.   Yes [provider]  oxyCODONE (ROXICODONE) 5 MG immediate release tablet Take 1-2 tablets (5-10 mg total) by mouth every 4 (four) hours as needed for moderate pain or severe pain. 12/25/22  Yes Poggi, Marshall Cork, MD  PSYLLIUM PO Take 3 capsules by mouth 2 (two) times daily.    Yes [provider]  rosuvastatin (CRESTOR) 10 MG tablet Take 10 mg by mouth daily. 04/14/22  Yes [provider]  tadalafil (CIALIS) 20 MG tablet Take 1 tablet (20 mg total) by mouth daily as needed for erectile  dysfunction. 12/06/22  Yes Stoioff, Ronda Fairly, MD  traZODone (DESYREL) 50 MG tablet Take 25 mg by mouth at bedtime as needed for sleep.   Yes [provider]  Vibegron (GEMTESA) 75 MG TABS Take 1 tablet (75 mg total) by mouth daily. 01/03/23  Yes McGowan, Larene Beach A, PA-C  tadalafil (CIALIS) 5 MG tablet Take 1 tablet (5 mg total) by mouth daily as needed for erectile dysfunction. 01/02/23   Zara Council A, PA-C    Allergies as of 10/22/2022 - Review Complete 05/18/2022  Allergen Reaction Noted   Fish-derived products Nausea And Vomiting 08/25/2019    Family History  Problem Relation Age of Onset   Diabetes Mother    Colon cancer Maternal Aunt    Prostate cancer Maternal Uncle    Bladder Cancer Neg Hx    Kidney cancer Neg Hx     Social History   Socioeconomic History   Marital status: Widowed    Spouse name: Not on file   Number of children: Not on file   Years of education: Not on file   Highest education level: Not on file  Occupational History   Occupation: mental health supervisor    Comment: retired  Tobacco Use   Smoking status: Former    Packs/day: 0.75    Years: 40.00    Total pack years: 30.00    Types: Cigarettes    Quit date: 2009    Years since quitting: 15.1   Smokeless tobacco: Never  Vaping Use   Vaping Use: Some days  Substance and Sexual Activity   Alcohol use: No    Alcohol/week: 0.0 standard drinks of alcohol   Drug use: No   Sexual activity: Yes  Other Topics Concern   Not on file  Social History Narrative   Lives alone but patient will stay with daughter Deneise Lever for a few days after surgery   Social Determinants of Health   Financial Resource Strain: Not on file  Food Insecurity: Not on file  Transportation Needs: Not on file  Physical Activity: Not on file  Stress: Not on file  Social Connections: Not on file  Intimate Partner Violence: Not on file    Review of Systems: See HPI, otherwise negative ROS  Physical Exam: BP (!)  140/84   Pulse 77   Temp 98.6 F (37 C) (Temporal)   Resp 18   Ht '5\' 11"'$  (1.803 m)   Wt 92.5 kg   SpO2 98%   BMI 28.45 kg/m  General:   Alert, cooperative in NAD Head:  Normocephalic and atraumatic. Respiratory:  Normal work of breathing. Cardiovascular:  RRR  Impression/Plan: Adrian Hart is here for cataract surgery.  Risks, benefits, limitations, and alternatives regarding cataract surgery have been reviewed with the patient.  Questions have been answered.  All parties agreeable.   Benay Pillow, MD  01/07/2023, 10:54 AM

## 2023-01-07 NOTE — Anesthesia Preprocedure Evaluation (Addendum)
Anesthesia Evaluation  Patient identified by MRN, date of birth, ID band Patient awake    Reviewed: Allergy & Precautions, NPO status , Patient's Chart, lab work & pertinent test results  History of Anesthesia Complications (+) PONV and history of anesthetic complications (urinary retention)  Airway Mallampati: III  TM Distance: <3 FB Neck ROM: full    Dental  (+) Chipped   Pulmonary neg shortness of breath, COPD, neg recent URI, former smoker   Pulmonary exam normal        Cardiovascular hypertension, (-) angina + CAD  Normal cardiovascular exam     Neuro/Psych   Anxiety     negative neurological ROS  negative psych ROS   GI/Hepatic Neg liver ROS,GERD  Controlled,,  Endo/Other  negative endocrine ROS    Renal/GU      Musculoskeletal   Abdominal   Peds  Hematology negative hematology ROS (+)   Anesthesia Other Findings Past Medical History: No date: Anxiety No date: Arthritis No date: COPD (chronic obstructive pulmonary disease) (HCC) No date: Coronary artery disease No date: GERD (gastroesophageal reflux disease)     Comment:  PT TAKES APPLE CIDER VINEGAR  No date: Hypertension     Comment:  borderline pressures.  takes no meds No date: PONV (postoperative nausea and vomiting) No date: Prostate cancer ([place])     Comment:   needs no treatment. still on observation No date: Thoracic aortic atherosclerosis [facility])  Past Surgical History: 2003: COLONOSCOPY     Comment:  Dr Alveta Heimlich 05/02/2018: COLONOSCOPY WITH PROPOFOL; N/A     Comment:  Procedure: COLONOSCOPY WITH PROPOFOL;  Surgeon: Manya Silvas, MD;  Location[facility] ENDOSCOPY;  Service:               Endoscopy;  Laterality: N/A; No date: EYE SURGERY; Right     Comment:  cataract 1996: FLEXIBLE SIGMOIDOSCOPY     Comment:  Dr Bary Castilla 04-20-04: HEMORRHOID SURGERY     Comment:  Dr Bary Castilla 09/27/2015: HERNIA REPAIR     Comment:  Umbilical,  primary repair 12/20/2022: KNEE ARTHROSCOPY; Right     Comment:  Procedure: RIGHT KNEE ARTHROSCOPY WITH DEBRIDEMENT,               PARTIAL LATERAL MENISCECTOMY, AND EXCISION OF LOOSE BODY               RIGHT KNEE.;  Surgeon: Corky Mull, MD;  Location: [facility]              ORS;  Service: Orthopedics;  Laterality: Right; 1995: KNEE SURGERY; Left     Comment:  arthroscopy 09/05/2017: PARTIAL KNEE ARTHROPLASTY; Right     Comment:  Procedure: UNICOMPARTMENTAL KNEE;  Surgeon: Corky Mull, MD;  Location: [facility] ORS;  Service: Orthopedics;                Laterality: Right; 09/01/2019: PARTIAL KNEE ARTHROPLASTY; Left     Comment:  Procedure: UNICOMPARTMENTAL KNEE;  Surgeon: Corky Mull, MD;  Location: [facility] ORS;  Service: Orthopedics;                Laterality: Left; No date: TONSILLECTOMY 34/91/7915: UMBILICAL HERNIA REPAIR; N/A     Comment:  Procedure: HERNIA REPAIR UMBILICAL ADULT;  Surgeon:  Robert Bellow, MD;  Location: [facility] ORS;  Service:               General;  Laterality: N/A; 07/28/2020: XI ROBOTIC ASSISTED INGUINAL HERNIA REPAIR WITH MESH; Left     Comment:  Procedure: XI ROBOTIC ASSISTED INGUINAL HERNIA REPAIR               WITH MESH;  Surgeon: Jules Husbands, MD;  Location: [facility]               ORS;  Service: General;  Laterality: Left;  BMI    Body Mass Index: 28.59 kg/m      Reproductive/Obstetrics negative OB ROS                             Anesthesia Physical Anesthesia Plan  ASA: 3  Anesthesia Plan: MAC   Post-op Pain Management:    Induction: Intravenous  PONV Risk Score and Plan: Midazolam and Treatment may vary due to age or medical condition  Airway Management Planned: Natural Airway and Nasal Cannula  Additional Equipment:   Intra-op Plan:   Post-operative Plan:   Informed Consent: I have reviewed the patients History and Physical, chart, labs and discussed the procedure including the  risks, benefits and alternatives for the proposed anesthesia with the patient or authorized representative who has indicated his/her understanding and acceptance.     Dental Advisory Given  Plan Discussed with: Anesthesiologist, CRNA and Surgeon  Anesthesia Plan Comments: (Patient consented for risks of anesthesia including but not limited to:  - adverse reactions to medications - damage to eyes, teeth, lips or other oral mucosa - nerve damage due to positioning  - sore throat or hoarseness - Damage to heart, brain, nerves, lungs, other parts of body or loss of life  Patient voiced understanding.)       Anesthesia Quick Evaluation

## 2023-01-07 NOTE — Op Note (Signed)
OPERATIVE NOTE  DAMERE BRANDENBURG [id number] 01/07/2023   PREOPERATIVE DIAGNOSIS:  Nuclear sclerotic cataract left eye.  H25.12   POSTOPERATIVE DIAGNOSIS:    Nuclear sclerotic cataract left eye.     PROCEDURE:  Phacoemusification with posterior chamber intraocular lens placement of the left eye   LENS:   Implant Name Type Inv. Item Serial No. Manufacturer Lot No. LRB No. Used Action  LENS IOL TECNIS EYHANCE 17.0 - [id number] Intraocular Lens LENS IOL TECNIS EYHANCE 17.0 [id number] [employer]  Left 1 Implanted      Procedure(s): CATARACT EXTRACTION PHACO AND INTRAOCULAR LENS PLACEMENT (IOC) LEFT 2.79 00:27.1 (Left)  DIB00 +17.0   ULTRASOUND TIME: 0 minutes 27 seconds.  CDE 2.79   SURGEON:  Benay Pillow, MD, MPH   ANESTHESIA:  Topical with tetracaine drops augmented with 1% preservative-free intracameral lidocaine.  ESTIMATED BLOOD LOSS: <1 mL   COMPLICATIONS:  None.   DESCRIPTION OF PROCEDURE:  The patient was identified in the holding room and transported to the operating room and placed in the supine position under the operating microscope.  The left eye was identified as the operative eye and it was prepped and draped in the usual sterile ophthalmic fashion.   A 1.0 millimeter clear-corneal paracentesis was made at the 5:00 position. 0.5 ml of preservative-free 1% lidocaine with epinephrine was injected into the anterior chamber.  The anterior chamber was filled with viscoelastic.  A 2.4 millimeter keratome was used to make a near-clear corneal incision at the 2:00 position.  A curvilinear capsulorrhexis was made with a cystotome and capsulorrhexis forceps.  Balanced salt solution was used to hydrodissect and hydrodelineate the nucleus.   Phacoemulsification was then used in stop and chop fashion to remove the lens nucleus and epinucleus.  The remaining cortex was then removed using the irrigation and aspiration handpiece. Viscoelastic was then placed into the capsular bag to  distend it for lens placement.  A lens was then injected into the capsular bag.  The remaining viscoelastic was aspirated.   Wounds were hydrated with balanced salt solution.  The anterior chamber was inflated to a physiologic pressure with balanced salt solution.  Intracameral vigamox 0.1 mL undiltued was injected into the eye and a drop placed onto the ocular surface.  No wound leaks were noted.  The patient was taken to the recovery room in stable condition without complications of anesthesia or surgery  Benay Pillow 01/07/2023, 11:19 AM

## 2023-01-07 NOTE — Transfer of Care (Signed)
Immediate Anesthesia Transfer of Care Note  Patient: Adrian Hart  Procedure(s) Performed: CATARACT EXTRACTION PHACO AND INTRAOCULAR LENS PLACEMENT (IOC) LEFT 2.79 00:27.1 (Left: Eye)  Patient Location: PACU  Anesthesia Type:MAC  Level of Consciousness: awake, alert , and oriented  Airway & Oxygen Therapy: Patient Spontanous Breathing  Post-op Assessment: Report given to RN  Post vital signs: Reviewed and stable  Last Vitals:  Vitals Value Taken Time  BP 137/82 01/07/23 1120  Temp 36.4 C 01/07/23 1121  Pulse 71 01/07/23 1122  Resp 16 01/07/23 1122  SpO2 94 % 01/07/23 1122  Vitals shown include unvalidated device data.  Last Pain:  Vitals:   01/07/23 1016  TempSrc: Temporal  PainSc: 0-No pain         Complications: No notable events documented.

## 2023-01-07 NOTE — Anesthesia Postprocedure Evaluation (Signed)
Anesthesia Post Note  Patient: Adrian Hart  Procedure(s) Performed: CATARACT EXTRACTION PHACO AND INTRAOCULAR LENS PLACEMENT (IOC) LEFT 2.79 00:27.1 (Left: Eye)  Patient location during evaluation: PACU Anesthesia Type: MAC Level of consciousness: awake and alert Pain management: pain level controlled Vital Signs Assessment: post-procedure vital signs reviewed and stable Respiratory status: spontaneous breathing, nonlabored ventilation, respiratory function stable and patient connected to nasal cannula oxygen Cardiovascular status: stable and blood pressure returned to baseline Postop Assessment: no apparent nausea or vomiting Anesthetic complications: no   No notable events documented.   Last Vitals:  Vitals:   01/07/23 1121 01/07/23 1126  BP: 137/82 137/89  Pulse: 74 70  Resp: 18 18  Temp: (!) 36.4 C (!) 36.4 C  SpO2: 100% 97%    Last Pain:  Vitals:   01/07/23 1126  TempSrc:   PainSc: 0-No pain                 Martha Clan

## 2023-01-08 ENCOUNTER — Encounter: Payer: Self-pay | Admitting: Ophthalmology

## 2023-01-09 NOTE — Progress Notes (Unsigned)
01/10/2023 3:22 PM   Adrian Hart 03/19/1951 [id number]  Referring provider: Kirk Ruths, MD [place] [facility] [place],  [place] [address]  Urological history: 1.  Low risk prostate cancer -PSA (05/2022) 2.5 -cT1c adenocarcinoma prostate low risk  -Biopsy 08/2018; abnormal DRE; PSA 2.7; 66 g prostate  -4/12 cores positive Gleason 3+3 (LLB, LLM, LM, LA)  -1-25% involvement  -Prostate MRI (12/18/18) w/2 PI-RADS 4 lesions left base and left mid gland concerning for high-grade carcinoma, no adenopathy, capsule intact, normal seminal vesicles  -Fusion Biopsy 01/2019: PI-RADS 4 lesions were benign, left side of biopsy positive Gleason 3+3  -Elected surveillance   2. BPH w/ LU TS  3. ED -Contributing factors of age, prostate cancer, BPH, coronary artery disease, elevated hemoglobin A1c, hyperlipidemia, COPD, anxiety, smoking history -Tadalafil 20 mg, on-demand dosing   Chief Complaint  Patient presents with   voiding trial    HPI: Adrian Hart is a 72 y.o.male who presents today for urinary retention.  He presented on January 02, 2023 for a trial of void and failed.  I did instruct him on self-catheterization technique, but he was unable to continue this at home and presented the next day in urinary retention.  He presents today for a second trial and void.  He has not filled his tadalafil 5 mg daily prescription.  Patient denies any modifying or aggravating factors.  Patient denies any gross hematuria, dysuria or suprapubic/flank pain.  Patient denies any fevers, chills, nausea or vomiting.    Foley is removed this am.    He has not been able to urinate all day.  Bladder scan > 1000 mL   PMH: Past Medical History:  Diagnosis Date   Anxiety    Arthritis    COPD (chronic obstructive pulmonary disease) (HCC)    Coronary artery disease    GERD (gastroesophageal reflux disease)    PT TAKES APPLE CIDER VINEGAR    Hypertension     borderline pressures.  takes no meds   PONV (postoperative nausea and vomiting)    also - urinarty retention after knee surgery (12/25/22)   Prostate cancer ([place])     needs no treatment. still on observation   Thoracic aortic atherosclerosis [facility])     Surgical History: Past Surgical History:  Procedure Laterality Date   CATARACT EXTRACTION W/PHACO Left 01/07/2023   Procedure: CATARACT EXTRACTION PHACO AND INTRAOCULAR LENS PLACEMENT (IOC) LEFT 2.79 00:27.1;  Surgeon: Eulogio Bear, MD;  Location: [facility];  Service: Ophthalmology;  Laterality: Left;   COLONOSCOPY  2003   Dr Alveta Heimlich   COLONOSCOPY WITH PROPOFOL N/A 05/02/2018   Procedure: COLONOSCOPY WITH PROPOFOL;  Surgeon: Manya Silvas, MD;  Location: [facility] ENDOSCOPY;  Service: Endoscopy;  Laterality: N/A;   EYE SURGERY Right    cataract   FLEXIBLE SIGMOIDOSCOPY  1996   Dr Bary Castilla   HEMORRHOID SURGERY  04-20-04   Dr Bary Castilla   HERNIA REPAIR  81/44/8185   Umbilical, primary repair   KNEE ARTHROSCOPY Right 12/20/2022   Procedure: RIGHT KNEE ARTHROSCOPY WITH DEBRIDEMENT, PARTIAL LATERAL MENISCECTOMY, AND EXCISION OF LOOSE BODY RIGHT KNEE.;  Surgeon: Corky Mull, MD;  Location: [facility] ORS;  Service: Orthopedics;  Laterality: Right;   KNEE ARTHROSCOPY Right 12/25/2022   Procedure: RIGHT KNEE ARTHROSCOPY WITH IRRIGATION AND DEBRIDEMENT.;  Surgeon: Corky Mull, MD;  Location: [facility] ORS;  Service: Orthopedics;  Laterality: Right;   KNEE SURGERY Left 1995   arthroscopy  PARTIAL KNEE ARTHROPLASTY Right 09/05/2017   Procedure: UNICOMPARTMENTAL KNEE;  Surgeon: Corky Mull, MD;  Location: [facility] ORS;  Service: Orthopedics;  Laterality: Right;   PARTIAL KNEE ARTHROPLASTY Left 09/01/2019   Procedure: UNICOMPARTMENTAL KNEE;  Surgeon: Corky Mull, MD;  Location: [facility] ORS;  Service: Orthopedics;  Laterality: Left;   TONSILLECTOMY     UMBILICAL HERNIA REPAIR N/A 09/27/2015   Procedure: HERNIA REPAIR UMBILICAL ADULT;  Surgeon: Robert Bellow, MD;  Location: [facility] ORS;  Service: General;  Laterality: N/A;   XI ROBOTIC ASSISTED INGUINAL HERNIA REPAIR WITH MESH Left 07/28/2020   Procedure: XI ROBOTIC ASSISTED INGUINAL HERNIA REPAIR WITH MESH;  Surgeon: Jules Husbands, MD;  Location: [facility] ORS;  Service: General;  Laterality: Left;    Home Medications:  Allergies as of 01/10/2023       Reactions   Fish-derived Products Nausea And Vomiting   Reaction to mussells only        Medication List        Accurate as of January 10, 2023  3:22 PM. If you have any questions, ask your nurse or doctor.          escitalopram 20 MG tablet Commonly known as: LEXAPRO Take 20 mg by mouth daily.   Gemtesa 75 MG Tabs Generic drug: Vibegron Take 1 tablet (75 mg total) by mouth daily.   Ginseng 100 MG Caps Take 100 mg by mouth daily.   ibuprofen 200 MG tablet Commonly known as: ADVIL Take 200 mg by mouth every 6 (six) hours as needed.   MACA PO Take by mouth.   Multi-Vitamins Tabs Take 1 tablet by mouth daily.   naproxen sodium 220 MG tablet Commonly known as: ALEVE Take 220 mg by mouth daily as needed.   omeprazole 40 MG capsule Commonly known as: PRILOSEC Take 40 mg by mouth daily.   oxyCODONE 5 MG immediate release tablet Commonly known as: Roxicodone Take 1-2 tablets (5-10 mg total) by mouth every 4 (four) hours as needed for moderate pain or severe pain.   PSYLLIUM PO Take 3 capsules by mouth 2 (two) times daily.   rosuvastatin 10 MG tablet Commonly known as: CRESTOR Take 10 mg by mouth daily.   tadalafil 20 MG tablet Commonly known as: CIALIS Take 1 tablet (20 mg total) by mouth daily as needed for erectile dysfunction.   tadalafil 5 MG tablet Commonly known as: CIALIS Take 1 tablet (5 mg total) by mouth daily as needed for erectile dysfunction.   traZODone 50 MG tablet Commonly known as: DESYREL Take 25 mg by mouth at bedtime as needed for sleep.   VITAMIN B 12 PO Take by mouth 3 (three) times  a week.   ZINC PO Take by mouth.        Allergies:  Allergies  Allergen Reactions   Fish-Derived Products Nausea And Vomiting    Reaction to mussells only    Family History: Family History  Problem Relation Age of Onset   Diabetes Mother    Colon cancer Maternal Aunt    Prostate cancer Maternal Uncle    Bladder Cancer Neg Hx    Kidney cancer Neg Hx     Social History:  reports that he quit smoking about 15 years ago. His smoking use included cigarettes. He has a 30.00 pack-year smoking history. He has never used smokeless tobacco. He reports that he does not drink alcohol and does not use drugs.  ROS: Pertinent ROS in HPI  Physical Exam:  Blood pressure (!) 142/87, pulse 84, height '5\' 11"'$  (1.803 m), weight 204 lb (92.5 kg).  Constitutional:  Well nourished. Alert and oriented, No acute distress. HEENT: [place] AT, moist mucus membranes.  Trachea midline Cardiovascular: No clubbing, cyanosis, or edema. Respiratory: Normal respiratory effort, no increased work of breathing. GU: No CVA tenderness.  No bladder fullness or masses.  Patient with circumcised phallus.  Neurologic: Grossly intact, no focal deficits, moving all 4 extremities. Psychiatric: Normal mood and affect.   Laboratory Data: N/A  Catheter Removal  Patient is present today for a catheter removal.  9 ml of water was drained from the balloon. A 16 FR coude foley cath was removed from the bladder, no complications were noted. Patient tolerated well.  Performed by: Zara Council, PA-C and Kyra Manges, CMA  Simple Catheter Placement  Due to urinary retention patient is present today for a foley cath placement.  Patient was cleaned and prepped in a sterile fashion with betadine and 2% lidocaine jelly was instilled into the urethra. A 18 FR Coude foley catheter was inserted, urine return was noted  1100 ml, urine was yellow in color.  The balloon was filled with 10cc of sterile water.  A leg bag was attached  for drainage.  Patient was given instruction on proper catheter care.  Patient tolerated well, no complications were noted   Performed by: Zara Council, PA-C and Launa Grill, CMA   Pertinent Imaging:  01/10/23 15:19  Scan Result >1000 mL    Assessment & Plan:    1. Acute urinary retention:    - Foley catheter removed - he had been unable to void - encouraged him to pick up the tadalafil 5 mg daily -return in two weeks for TOV and one month for cysto w/ Dr. Bernardo Heater - if he passes his next voiding trial we will cancel the cysto  2. Prostate cancer -Low-grade prostate cancer on active surveillance -Has an appointment in June for PSA and office visit with Dr. Bernardo Heater  Return in about 2 weeks (around 01/24/2023) for TOV and PVR .  These notes generated with voice recognition software. I apologize for typographical errors.  [place], [place] [address]  [place], [place] [address] [phone]

## 2023-01-10 ENCOUNTER — Encounter: Payer: Self-pay | Admitting: Urology

## 2023-01-10 ENCOUNTER — Ambulatory Visit (INDEPENDENT_AMBULATORY_CARE_PROVIDER_SITE_OTHER): Payer: HMO | Admitting: Urology

## 2023-01-10 ENCOUNTER — Ambulatory Visit: Payer: HMO | Admitting: Urology

## 2023-01-10 VITALS — BP 142/87 | HR 84 | Ht 71.0 in | Wt 204.0 lb

## 2023-01-10 DIAGNOSIS — C61 Malignant neoplasm of prostate: Secondary | ICD-10-CM

## 2023-01-10 DIAGNOSIS — R338 Other retention of urine: Secondary | ICD-10-CM

## 2023-01-10 LAB — BLADDER SCAN AMB NON-IMAGING: Scan Result: >1000

## 2023-01-16 ENCOUNTER — Ambulatory Visit: Payer: HMO | Admitting: Urology

## 2023-01-16 ENCOUNTER — Telehealth: Payer: Self-pay | Admitting: Family Medicine

## 2023-01-16 NOTE — Telephone Encounter (Signed)
Patient is going to use [employer] [facility]

## 2023-01-16 NOTE — Telephone Encounter (Signed)
LMOM for patient to return call. We received a denial from insurance company for the Tadalafil. I am asking if he is using the [employer] coupon.

## 2023-01-21 NOTE — Progress Notes (Unsigned)
01/22/2023 8:08 AM   Adrian Hart 05/23/1951 [id number]  Referring provider: Kirk Ruths, MD [place] [place],  [place] [address]  Urological history: 1.  Low risk prostate cancer -PSA (05/2022) 2.5 -cT1c adenocarcinoma prostate low risk  -Biopsy 08/2018; abnormal DRE; PSA 2.7; 66 g prostate  -4/12 cores positive Gleason 3+3 (LLB, LLM, LM, LA)  -1-25% involvement  -Prostate MRI (12/18/18) w/2 PI-RADS 4 lesions left base and left mid gland concerning for high-grade carcinoma, no adenopathy, capsule intact, normal seminal vesicles  -Fusion Biopsy 01/2019: PI-RADS 4 lesions were benign, left side of biopsy positive Gleason 3+3  -Elected surveillance   2. BPH w/ LU TS  3. ED -Contributing factors of age, prostate cancer, BPH, coronary artery disease, elevated hemoglobin A1c, hyperlipidemia, COPD, anxiety, smoking history -Tadalafil 20 mg, on-demand dosing   No chief complaint on file.   HPI: Adrian Hart is a 72 y.o.male who presents today for a trial of void.  H  He presented on January 02, 2023 for a trial of void and failed.  I did instruct him on self-catheterization technique, but he was unable to continue this at home and presented the next day in urinary retention.  On 01/10/2023, he presented for a second trial and void.  He had not filled his tadalafil 5 mg daily prescription.  He again failed to void.  Bladder scan > 1000 mL and Foley was replaced.  He was encouraged to fill the tadalafil prescription and scheduled for another voiding trial per patient's request and cystoscopy.  His cystoscopy is scheduled for 02/01/2023.    PMH: Past Medical History:  Diagnosis Date   Anxiety    Arthritis    COPD (chronic obstructive pulmonary disease) (HCC)    Coronary artery disease    GERD (gastroesophageal reflux disease)    PT TAKES APPLE CIDER VINEGAR    Hypertension    borderline pressures.  takes no meds   PONV  (postoperative nausea and vomiting)    also - urinarty retention after knee surgery (12/25/22)   Prostate cancer ([place])     needs no treatment. still on observation   Thoracic aortic atherosclerosis [facility])     Surgical History: Past Surgical History:  Procedure Laterality Date   CATARACT EXTRACTION W/PHACO Left 01/07/2023   Procedure: CATARACT EXTRACTION PHACO AND INTRAOCULAR LENS PLACEMENT (IOC) LEFT 2.79 00:27.1;  Surgeon: Eulogio Bear, MD;  Location: [place];  Service: Ophthalmology;  Laterality: Left;   COLONOSCOPY  2003   Dr Alveta Heimlich   COLONOSCOPY WITH PROPOFOL N/A 05/02/2018   Procedure: COLONOSCOPY WITH PROPOFOL;  Surgeon: Manya Silvas, MD;  Location: [facility] ENDOSCOPY;  Service: Endoscopy;  Laterality: N/A;   EYE SURGERY Right    cataract   FLEXIBLE SIGMOIDOSCOPY  1996   Dr Bary Castilla   HEMORRHOID SURGERY  04-20-04   Dr Bary Castilla   HERNIA REPAIR  [id number]   Umbilical, primary repair   KNEE ARTHROSCOPY Right 12/20/2022   Procedure: RIGHT KNEE ARTHROSCOPY WITH DEBRIDEMENT, PARTIAL LATERAL MENISCECTOMY, AND EXCISION OF LOOSE BODY RIGHT KNEE.;  Surgeon: Corky Mull, MD;  Location: [facility];  Service: Orthopedics;  Laterality: Right;   KNEE ARTHROSCOPY Right 12/25/2022   Procedure: RIGHT KNEE ARTHROSCOPY WITH IRRIGATION AND DEBRIDEMENT.;  Surgeon: Corky Mull, MD;  Location: [facility];  Service: Orthopedics;  Laterality: Right;   KNEE SURGERY Left 1995   arthroscopy   PARTIAL KNEE ARTHROPLASTY Right 09/05/2017   Procedure:  UNICOMPARTMENTAL KNEE;  Surgeon: Corky Mull, MD;  Location: [facility];  Service: Orthopedics;  Laterality: Right;   PARTIAL KNEE ARTHROPLASTY Left 09/01/2019   Procedure: UNICOMPARTMENTAL KNEE;  Surgeon: Corky Mull, MD;  Location: [facility];  Service: Orthopedics;  Laterality: Left;   TONSILLECTOMY     UMBILICAL HERNIA REPAIR N/A 09/27/2015   Procedure: HERNIA REPAIR UMBILICAL ADULT;  Surgeon: Robert Bellow, MD;  Location: [facility];  Service:  General;  Laterality: N/A;   XI ROBOTIC ASSISTED INGUINAL HERNIA REPAIR WITH MESH Left 07/28/2020   Procedure: XI ROBOTIC ASSISTED INGUINAL HERNIA REPAIR WITH MESH;  Surgeon: Jules Husbands, MD;  Location: [facility];  Service: General;  Laterality: Left;    Home Medications:  Allergies as of 01/22/2023       Reactions   Fish-derived Products Nausea And Vomiting   Reaction to mussells only        Medication List        Accurate as of January 21, 2023  8:08 AM. If you have any questions, ask your nurse or doctor.          escitalopram 20 MG tablet Commonly known as: LEXAPRO Take 20 mg by mouth daily.   Gemtesa 75 MG Tabs Generic drug: Vibegron Take 1 tablet (75 mg total) by mouth daily.   Ginseng 100 MG Caps Take 100 mg by mouth daily.   ibuprofen 200 MG tablet Commonly known as: ADVIL Take 200 mg by mouth every 6 (six) hours as needed.   MACA PO Take by mouth.   Multi-Vitamins Tabs Take 1 tablet by mouth daily.   naproxen sodium 220 MG tablet Commonly known as: ALEVE Take 220 mg by mouth daily as needed.   omeprazole 40 MG capsule Commonly known as: PRILOSEC Take 40 mg by mouth daily.   oxyCODONE 5 MG immediate release tablet Commonly known as: Roxicodone Take 1-2 tablets (5-10 mg total) by mouth every 4 (four) hours as needed for moderate pain or severe pain.   PSYLLIUM PO Take 3 capsules by mouth 2 (two) times daily.   rosuvastatin 10 MG tablet Commonly known as: CRESTOR Take 10 mg by mouth daily.   tadalafil 20 MG tablet Commonly known as: CIALIS Take 1 tablet (20 mg total) by mouth daily as needed for erectile dysfunction.   tadalafil 5 MG tablet Commonly known as: CIALIS Take 1 tablet (5 mg total) by mouth daily as needed for erectile dysfunction.   traZODone 50 MG tablet Commonly known as: DESYREL Take 25 mg by mouth at bedtime as needed for sleep.   VITAMIN B 12 PO Take by mouth 3 (three) times a week.   ZINC PO Take by mouth.         Allergies:  Allergies  Allergen Reactions   Fish-Derived Products Nausea And Vomiting    Reaction to mussells only    Family History: Family History  Problem Relation Age of Onset   Diabetes Mother    Colon cancer Maternal Aunt    Prostate cancer Maternal Uncle    Bladder Cancer Neg Hx    Kidney cancer Neg Hx     Social History:  reports that he quit smoking about 15 years ago. His smoking use included cigarettes. He has a 30.00 pack-year smoking history. He has never used smokeless tobacco. He reports that he does not drink alcohol and does not use drugs.  ROS: Pertinent ROS in HPI  Physical Exam: Blood pressure (!) 142/87, pulse 84, height 5'  11" (1.803 m), weight 204 lb (92.5 kg).  Constitutional:  Well nourished. Alert and oriented, No acute distress. HEENT: [place] AT, moist mucus membranes.  Trachea midline Cardiovascular: No clubbing, cyanosis, or edema. Respiratory: Normal respiratory effort, no increased work of breathing. GU: No CVA tenderness.  No bladder fullness or masses.  Patient with circumcised/uncircumcised phallus. ***Foreskin easily retracted***  Urethral meatus is patent.  No penile discharge. No penile lesions or rashes. Scrotum without lesions, cysts, rashes and/or edema.  Testicles are located scrotally bilaterally. No masses are appreciated in the testicles. Left and right epididymis are normal. Rectal: Patient with  normal sphincter tone. Anus and perineum without scarring or rashes. No rectal masses are appreciated. Prostate is approximately *** grams, *** nodules are appreciated. Seminal vesicles are normal. Neurologic: Grossly intact, no focal deficits, moving all 4 extremities. Psychiatric: Normal mood and affect.   Laboratory Data: N/A  Catheter Removal  Patient is present today for a catheter removal.  ***ml of water was drained from the balloon. A ***FR foley cath was removed from the bladder, {dnt complications:20057}. Patient tolerated  well.  Performed by: ***  Pertinent Imaging: ***   Assessment & Plan:    1. Acute urinary retention:    - Foley catheter removed - he had been unable to void - encouraged him to pick up the tadalafil 5 mg daily -return in two weeks for TOV and one month for cysto w/ Dr. Bernardo Heater - if he passes his next voiding trial we will cancel the cysto  2. Prostate cancer -Low-grade prostate cancer on active surveillance -Has an appointment in June for PSA and office visit with Dr. Bernardo Heater  No follow-ups on file.  These notes generated with voice recognition software. I apologize for typographical errors.  [place], [place] [address]  [place], [place] [address] [phone]

## 2023-01-22 ENCOUNTER — Encounter: Payer: Self-pay | Admitting: Urology

## 2023-01-22 ENCOUNTER — Ambulatory Visit (INDEPENDENT_AMBULATORY_CARE_PROVIDER_SITE_OTHER): Payer: HMO | Admitting: Urology

## 2023-01-22 ENCOUNTER — Ambulatory Visit: Payer: HMO | Admitting: Urology

## 2023-01-22 VITALS — BP 135/86 | HR 76 | Ht 71.0 in | Wt 204.0 lb

## 2023-01-22 DIAGNOSIS — I700 Atherosclerosis of aorta: Secondary | ICD-10-CM | POA: Diagnosis not present

## 2023-01-22 DIAGNOSIS — I7300 Raynaud's syndrome without gangrene: Secondary | ICD-10-CM | POA: Diagnosis not present

## 2023-01-22 DIAGNOSIS — I2510 Atherosclerotic heart disease of native coronary artery without angina pectoris: Secondary | ICD-10-CM | POA: Diagnosis not present

## 2023-01-22 DIAGNOSIS — C61 Malignant neoplasm of prostate: Secondary | ICD-10-CM

## 2023-01-22 DIAGNOSIS — R7303 Prediabetes: Secondary | ICD-10-CM | POA: Diagnosis not present

## 2023-01-22 DIAGNOSIS — R338 Other retention of urine: Secondary | ICD-10-CM | POA: Diagnosis not present

## 2023-01-22 DIAGNOSIS — F411 Generalized anxiety disorder: Secondary | ICD-10-CM | POA: Diagnosis not present

## 2023-01-22 LAB — BLADDER SCAN AMB NON-IMAGING: SCA Result: 780

## 2023-01-22 NOTE — Progress Notes (Signed)
Catheter Removal  Patient is present today for a catheter removal.  9 ml of water was drained from the balloon. A 18FR foley cath was removed from the bladder, no complications were noted. Patient tolerated well.  Performed by: Natale Lay Magallon-Mariche  Follow up/ Additional notes: 4pm 01/22/23

## 2023-01-24 ENCOUNTER — Other Ambulatory Visit: Payer: HMO

## 2023-01-24 ENCOUNTER — Other Ambulatory Visit: Payer: Self-pay | Admitting: Urology

## 2023-01-24 ENCOUNTER — Ambulatory Visit
Admission: RE | Admit: 2023-01-24 | Discharge: 2023-01-24 | Disposition: A | Discharge status: Home / Self Care | Payer: HMO | Source: Ambulatory Visit | Attending: Urology | Admitting: Urology

## 2023-01-24 ENCOUNTER — Telehealth: Payer: Self-pay

## 2023-01-24 ENCOUNTER — Ambulatory Visit (INDEPENDENT_AMBULATORY_CARE_PROVIDER_SITE_OTHER): Payer: HMO | Admitting: Urology

## 2023-01-24 VITALS — BP 134/82 | HR 68 | Ht 71.0 in | Wt 214.0 lb

## 2023-01-24 DIAGNOSIS — M545 Low back pain, unspecified: Secondary | ICD-10-CM | POA: Diagnosis not present

## 2023-01-24 DIAGNOSIS — R3129 Other microscopic hematuria: Secondary | ICD-10-CM

## 2023-01-24 DIAGNOSIS — R109 Unspecified abdominal pain: Secondary | ICD-10-CM | POA: Insufficient documentation

## 2023-01-24 DIAGNOSIS — R8271 Bacteriuria: Secondary | ICD-10-CM | POA: Diagnosis not present

## 2023-01-24 DIAGNOSIS — C61 Malignant neoplasm of prostate: Secondary | ICD-10-CM | POA: Diagnosis not present

## 2023-01-24 DIAGNOSIS — R3989 Other symptoms and signs involving the genitourinary system: Secondary | ICD-10-CM

## 2023-01-24 DIAGNOSIS — R339 Retention of urine, unspecified: Secondary | ICD-10-CM

## 2023-01-24 DIAGNOSIS — K5909 Other constipation: Secondary | ICD-10-CM

## 2023-01-24 DIAGNOSIS — K5900 Constipation, unspecified: Secondary | ICD-10-CM | POA: Diagnosis not present

## 2023-01-24 MED ORDER — SULFAMETHOXAZOLE-TRIMETHOPRIM 800-160 MG PO TABS: 1.0000 | ORAL_TABLET | Freq: Two times a day (BID) | ORAL | 0 refills | Status: DC

## 2023-01-24 MED ORDER — BISACODYL 5 MG PO TBEC: 5.0000 mg | DELAYED_RELEASE_TABLET | Freq: Every day | ORAL | 0 refills | Status: DC | PRN

## 2023-01-24 MED ORDER — KETOROLAC TROMETHAMINE 10 MG PO TABS: 10.0000 mg | ORAL_TABLET | Freq: Four times a day (QID) | ORAL | 0 refills | Status: DC | PRN

## 2023-01-24 NOTE — Telephone Encounter (Signed)
Patient called stating yesterday-01/23/23 he developed horrific lower back pain mainly on the right side, it has been constant. Today it is a little better but still there, describes as dull and sharp at times constant, does not radiate to the abdomen. He has had nausea and decreased appetite since yesterday. No fever or vomiting. He saw a small blood clot in his urine yesterday 1 time and none since. He did have some blood in the urine 1 time on 01/16/23. He has had chills off and on for 2 weeks and states he mentioned that part during his recent visit on 01/22/23. He feels bloated. Urine output has been pretty normal. He has been taking Advil/Ibuprofen but this does not help much with the pain. He wonders if he needs any imaging done and also if he can try anything else for the pain. CB [phone]

## 2023-01-24 NOTE — Telephone Encounter (Signed)
Catheter is producing urine, no blood. Patient is on the way now to get UA done here and then will go to Xray

## 2023-01-24 NOTE — Progress Notes (Signed)
01/24/2023 7:19 PM   Adrian Hart Jan 15, 1951 [id number]  Referring provider: Kirk Ruths, MD [place] [facility] [place],  [place] [address]  Urological history: 1.  Low risk prostate cancer -PSA (05/2022) 2.5 -cT1c adenocarcinoma prostate low risk  -Biopsy 08/2018; abnormal DRE; PSA 2.7; 66 g prostate  -4/12 cores positive Gleason 3+3 (LLB, LLM, LM, LA)  -1-25% involvement  -Prostate MRI (12/18/18) w/2 PI-RADS 4 lesions left base and left mid gland concerning for high-grade carcinoma, no adenopathy, capsule intact, normal seminal vesicles  -Fusion Biopsy 01/2019: PI-RADS 4 lesions were benign, left side of biopsy positive Gleason 3+3  -Elected surveillance   2. BPH w/ LU TS  3. ED -Contributing factors of age, prostate cancer, BPH, coronary artery disease, elevated hemoglobin A1c, hyperlipidemia, COPD, anxiety, smoking history -Tadalafil 20 mg, on-demand dosing  HPI: Adrian Hart is a 72 y.o.male who presents today for back pain.    He presented on January 02, 2023 for a trial of void and failed.  I did instruct him on self-catheterization technique, but he was unable to continue this at home and presented the next day in urinary retention.  On 01/10/2023, he presented for a second trial and void.  He had not filled his tadalafil 5 mg daily prescription.  He again failed to void.  Bladder scan > 1000 mL and Foley was replaced.  He was encouraged to fill the tadalafil prescription and scheduled for another voiding trial per patient's request and cystoscopy.  His cystoscopy is scheduled for 02/01/2023.    On 01/22/2023, his catheter was removed this morning and he has been able to urinate, but has been a very weak stream.  He starts to have intense suprapubic pain when he drinks any liquid.  His PVR 780 mL.  His catheter was replaced.    He presents today stating that he has been having right-sided back pain, chills and passage of a small blood  clot.  Patient denies any modifying or aggravating factors.  Patient denies any gross hematuria, dysuria or suprapubic/flank pain.  Patient denies any fevers, nausea or vomiting.    KUB demonstrates constipation.  UA yellow clear, specific gravity 1.025, 2+ blood, pH 5.5, 0-5 WBCs, 11-30 RBCs and moderate bacteria.  PMH: Past Medical History:  Diagnosis Date   Anxiety    Arthritis    COPD (chronic obstructive pulmonary disease) (HCC)    Coronary artery disease    GERD (gastroesophageal reflux disease)    PT TAKES APPLE CIDER VINEGAR    Hypertension    borderline pressures.  takes no meds   PONV (postoperative nausea and vomiting)    also - urinarty retention after knee surgery (12/25/22)   Prostate cancer ([place])     needs no treatment. still on observation   Thoracic aortic atherosclerosis [facility])     Surgical History: Past Surgical History:  Procedure Laterality Date   CATARACT EXTRACTION W/PHACO Left 01/07/2023   Procedure: CATARACT EXTRACTION PHACO AND INTRAOCULAR LENS PLACEMENT (IOC) LEFT 2.79 00:27.1;  Surgeon: Eulogio Bear, MD;  Location: [place];  Service: Ophthalmology;  Laterality: Left;   COLONOSCOPY  2003   Dr Alveta Heimlich   COLONOSCOPY WITH PROPOFOL N/A 05/02/2018   Procedure: COLONOSCOPY WITH PROPOFOL;  Surgeon: Manya Silvas, MD;  Location: [facility] ENDOSCOPY;  Service: Endoscopy;  Laterality: N/A;   EYE SURGERY Right    cataract   FLEXIBLE SIGMOIDOSCOPY  1996   Dr Bary Castilla   HEMORRHOID  SURGERY  04-20-04   Dr Bary Castilla   HERNIA REPAIR  [id number]   Umbilical, primary repair   KNEE ARTHROSCOPY Right 12/20/2022   Procedure: RIGHT KNEE ARTHROSCOPY WITH DEBRIDEMENT, PARTIAL LATERAL MENISCECTOMY, AND EXCISION OF LOOSE BODY RIGHT KNEE.;  Surgeon: Corky Mull, MD;  Location: [facility];  Service: Orthopedics;  Laterality: Right;   KNEE ARTHROSCOPY Right 12/25/2022   Procedure: RIGHT KNEE ARTHROSCOPY WITH IRRIGATION AND DEBRIDEMENT.;  Surgeon: Corky Mull, MD;   Location: [facility];  Service: Orthopedics;  Laterality: Right;   KNEE SURGERY Left 1995   arthroscopy   PARTIAL KNEE ARTHROPLASTY Right 09/05/2017   Procedure: UNICOMPARTMENTAL KNEE;  Surgeon: Corky Mull, MD;  Location: [facility];  Service: Orthopedics;  Laterality: Right;   PARTIAL KNEE ARTHROPLASTY Left 09/01/2019   Procedure: UNICOMPARTMENTAL KNEE;  Surgeon: Corky Mull, MD;  Location: [facility];  Service: Orthopedics;  Laterality: Left;   TONSILLECTOMY     UMBILICAL HERNIA REPAIR N/A 09/27/2015   Procedure: HERNIA REPAIR UMBILICAL ADULT;  Surgeon: Robert Bellow, MD;  Location: [facility];  Service: General;  Laterality: N/A;   XI ROBOTIC ASSISTED INGUINAL HERNIA REPAIR WITH MESH Left 07/28/2020   Procedure: XI ROBOTIC ASSISTED INGUINAL HERNIA REPAIR WITH MESH;  Surgeon: Jules Husbands, MD;  Location: [facility];  Service: General;  Laterality: Left;    Home Medications:  Allergies as of 01/24/2023       Reactions   Fish-derived Products Nausea And Vomiting   Reaction to mussells only        Medication List        Accurate as of January 24, 2023 11:59 PM. If you have any questions, ask your nurse or doctor.          STOP taking these medications    ibuprofen 200 MG tablet Commonly known as: ADVIL Stopped by: Carlena Ruybal, PA-C   naproxen sodium 220 MG tablet Commonly known as: ALEVE Stopped by: Jenelle Drennon, PA-C       TAKE these medications    bisacodyl 5 MG EC tablet Commonly known as: DULCOLAX Take 1 tablet (5 mg total) by mouth daily as needed for moderate constipation. Started by: Zara Council, PA-C   escitalopram 20 MG tablet Commonly known as: LEXAPRO Take 20 mg by mouth daily.   Gemtesa 75 MG Tabs Generic drug: Vibegron Take 1 tablet (75 mg total) by mouth daily.   Ginseng 100 MG Caps Take 100 mg by mouth daily.   ketorolac 10 MG tablet Commonly known as: TORADOL Take 1 tablet (10 mg total) by mouth every 6 (six) hours as  needed. Started by: Zara Council, PA-C   MACA PO Take by mouth.   Multi-Vitamins Tabs Take 1 tablet by mouth daily.   omeprazole 40 MG capsule Commonly known as: PRILOSEC Take 40 mg by mouth daily.   oxyCODONE 5 MG immediate release tablet Commonly known as: Roxicodone Take 1-2 tablets (5-10 mg total) by mouth every 4 (four) hours as needed for moderate pain or severe pain.   PSYLLIUM PO Take 3 capsules by mouth 2 (two) times daily.   rosuvastatin 10 MG tablet Commonly known as: CRESTOR Take 10 mg by mouth daily.   sulfamethoxazole-trimethoprim 800-160 MG tablet Commonly known as: BACTRIM DS Take 1 tablet by mouth every 12 (twelve) hours. Started by: Zara Council, PA-C   tadalafil 20 MG tablet Commonly known as: CIALIS Take 1 tablet (20 mg total) by mouth daily as needed for erectile dysfunction.  tadalafil 5 MG tablet Commonly known as: CIALIS Take 1 tablet (5 mg total) by mouth daily as needed for erectile dysfunction.   traZODone 50 MG tablet Commonly known as: DESYREL Take 25 mg by mouth at bedtime as needed for sleep.   VITAMIN B 12 PO Take by mouth 3 (three) times a week.   ZINC PO Take by mouth.        Allergies:  Allergies  Allergen Reactions   Fish-Derived Products Nausea And Vomiting    Reaction to mussells only    Family History: Family History  Problem Relation Age of Onset   Diabetes Mother    Colon cancer Maternal Aunt    Prostate cancer Maternal Uncle    Bladder Cancer Neg Hx    Kidney cancer Neg Hx     Social History:  reports that he quit smoking about 15 years ago. His smoking use included cigarettes. He has a 30.00 pack-year smoking history. He has never used smokeless tobacco. He reports that he does not drink alcohol and does not use drugs.  ROS: Pertinent ROS in HPI  Physical Exam: Blood pressure 134/82, pulse 68, height '5\' 11"'$  (1.803 m), weight 214 lb (97.1 kg). Constitutional:  Well nourished. Alert and oriented,  No acute distress. HEENT: [place] AT, moist mucus membranes.  Trachea midline Cardiovascular: No clubbing, cyanosis, or edema. Respiratory: Normal respiratory effort, no increased work of breathing. Neurologic: Grossly intact, no focal deficits, moving all 4 extremities. Psychiatric: Normal mood and affect.   Laboratory Data: N/A   Pertinent Imaging: CLINICAL DATA:  Abdominal pain.   EXAM: ABDOMEN - 1 VIEW   COMPARISON:  06/08/2020   FINDINGS: The bowel gas pattern is normal without gaseous distention. No radio-opaque calculi or other significant radiographic abnormality are seen. Increased stool noted consistent with constipation.   IMPRESSION: Constipation.     Electronically Signed   By: Sammie Bench M.D.   On: 01/25/2023 21:20 I have independently reviewed the films.  See HPI.    Assessment & Plan:    1. Acute urinary retention:    - he had been unable to void, failed TOV x 3 -return in two weeks for cysto w/ Dr. Bernardo Heater   2. Prostate cancer -Low-grade prostate cancer on active surveillance -Has an appointment in June for PSA and office visit with Dr. Bernardo Heater  3. Back pain -likely due to constipation -Given prescription of Dulcolax for the constipation -Given prescription for Toradol 10 mg every 6 hours.  For pain  4. Suspected UTI -UA with micro heme and bacteria -Urine sent for culture -Started on Septra DS twice daily until culture results are available  No follow-ups on file.  These notes generated with voice recognition software. I apologize for typographical errors.  [place], [place] [address]  [place],  [address] [phone]

## 2023-01-25 ENCOUNTER — Other Ambulatory Visit: Payer: Self-pay | Admitting: Urology

## 2023-01-25 DIAGNOSIS — M545 Low back pain, unspecified: Secondary | ICD-10-CM

## 2023-01-25 DIAGNOSIS — R3129 Other microscopic hematuria: Secondary | ICD-10-CM

## 2023-01-25 LAB — MICROSCOPIC EXAMINATION

## 2023-01-25 LAB — URINALYSIS, COMPLETE
Bilirubin, UA: NEGATIVE
Glucose, UA: NEGATIVE
Ketones, UA: NEGATIVE
Leukocytes,UA: NEGATIVE
Nitrite, UA: NEGATIVE
Protein,UA: NEGATIVE
Specific Gravity, UA: 1.025 (ref 1.005–1.030)
Urobilinogen, Ur: 0.2 mg/dL (ref 0.2–1.0)
pH, UA: 5.5 (ref 5.0–7.5)

## 2023-01-27 ENCOUNTER — Telehealth: Payer: Self-pay | Admitting: Urology

## 2023-01-27 ENCOUNTER — Encounter: Payer: Self-pay | Admitting: Urology

## 2023-01-27 NOTE — Telephone Encounter (Signed)
Contacted patient on Friday to check to see if he received his medications as we were having some issues with the pharmacy is not accepting prescriptions through E scribed.  He stated he did receive the prescription and had a bowel movement last evening.  He is still having some mild flank pain.  He came to the office Friday morning, and Elberta Leatherwood, CMA, repositioned his catheter.

## 2023-01-29 ENCOUNTER — Telehealth: Payer: Self-pay | Admitting: Family Medicine

## 2023-01-29 DIAGNOSIS — R3989 Other symptoms and signs involving the genitourinary system: Secondary | ICD-10-CM

## 2023-01-29 LAB — CULTURE, URINE COMPREHENSIVE

## 2023-01-29 MED ORDER — SULFAMETHOXAZOLE-TRIMETHOPRIM 800-160 MG PO TABS: 1.0000 | ORAL_TABLET | Freq: Two times a day (BID) | ORAL | 0 refills | Status: DC

## 2023-01-29 NOTE — Telephone Encounter (Signed)
Patient notified and voiced understanding, ABX sent to pharmacy.

## 2023-01-29 NOTE — Telephone Encounter (Signed)
-----   Message from Nori Riis, PA-C sent at 01/29/2023 10:05 AM EST ----- Please let Mr. Hoye know that his urine culture had a small amount of bacteria and we typically do not treat this with antibiotic, but since he is undergoing a procedure on Friday I would like for him to continue the Septra DS and he will need a refill.

## 2023-02-01 ENCOUNTER — Encounter: Payer: Self-pay | Admitting: Urology

## 2023-02-01 ENCOUNTER — Ambulatory Visit (INDEPENDENT_AMBULATORY_CARE_PROVIDER_SITE_OTHER): Payer: HMO | Admitting: Urology

## 2023-02-01 VITALS — BP 152/88 | HR 71 | Ht 71.0 in | Wt 214.0 lb

## 2023-02-01 DIAGNOSIS — R338 Other retention of urine: Secondary | ICD-10-CM

## 2023-02-01 DIAGNOSIS — N401 Enlarged prostate with lower urinary tract symptoms: Secondary | ICD-10-CM | POA: Diagnosis not present

## 2023-02-01 NOTE — Progress Notes (Signed)
   02/01/23  CC:  Chief Complaint  Patient presents with   Cysto    HPI: 72 y.o. male followed for low risk prostate cancer developed urinary retention after knee arthroscopy 12/05/2022.  He has failed voiding trials x 3.  He attempted CIC but was unsuccessful.  Prostate volume on a CT of 2021 estimated at 70 g  Blood pressure (!) 152/88, pulse 71, height '5\' 11"'$  (1.803 m), weight 214 lb (97.1 kg). NED. A&Ox3.   No respiratory distress   Abd soft, NT, ND Normal phallus with bilateral descended testicles  Cystoscopy Procedure Note  Patient identification was confirmed, informed consent was obtained, and patient was prepped using Betadine solution.  Lidocaine jelly was administered per urethral meatus.     Pre-Procedure: - Inspection reveals a normal caliber urethral meatus.  Procedure: The flexible cystoscope was introduced without difficulty - No urethral strictures/lesions are present. -  Coapting lateral lobes  prostate  - Elevated bladder neck - Bilateral ureteral orifices identified - Bladder mucosa  reveals inflammatory changes secondary to indwelling Foley but no solid or papillary lesions - No bladder stones -Moderate trabeculation  Retroflexion shows no intravesical median lobe   Post-Procedure: - Patient tolerated the procedure well  Assessment/ Plan: Prominent BPH He has failed voiding trials x 3 but wanted an additional trial today.  He will return this afternoon for a PVR We discussed outlet procedures for his retention.  Prostate volume on a CT 2021 was 70 cc.  Feel his best option going forward would be HoLEP.  We briefly discussed the procedure.  UroLift was also discussed.  He was provided literature on both and wanted to think over these options.  If he desired to proceed with UroLift would recommend TRUS for volume since his last imaging was 2 years ago    Abbie Sons, MD

## 2023-02-01 NOTE — Progress Notes (Signed)
Catheter Removal  Patient is present today for a catheter removal.  61m of water was drained from the balloon. A 18 coude foley cath was removed from the bladder, no complications were noted. Patient tolerated well.  Performed by: CElberta Leatherwood CMA  Follow up/ Additional notes: PM PVR

## 2023-02-01 NOTE — Progress Notes (Signed)
Simple Catheter Placement  Due to urinary retention patient is present today for a foley cath placement.  Patient was cleaned and prepped in a sterile fashion with betadine and 2% lidocaine jelly was instilled into the urethra. A 18 coude foley catheter was inserted, urine return was noted  747m, urine was yellow in color.  The balloon was filled with 10cc of sterile water.  A leg bag was attached for drainage. Patient was also given a night bag to take home and was given instruction on how to change from one bag to another.  Patient was given instruction on proper catheter care.  Patient tolerated well, no complications were noted   Performed by: CElberta Leatherwood CMA  Additional notes/ Follow up[facility]

## 2023-02-01 NOTE — Patient Instructions (Signed)

## 2023-02-07 DIAGNOSIS — Z961 Presence of intraocular lens: Secondary | ICD-10-CM | POA: Diagnosis not present

## 2023-02-08 ENCOUNTER — Ambulatory Visit
Admission: RE | Admit: 2023-02-08 | Discharge: 2023-02-08 | Disposition: A | Discharge status: Home / Self Care | Payer: HMO | Source: Ambulatory Visit | Attending: Internal Medicine | Admitting: Internal Medicine

## 2023-02-08 DIAGNOSIS — I700 Atherosclerosis of aorta: Secondary | ICD-10-CM | POA: Insufficient documentation

## 2023-02-08 DIAGNOSIS — Z122 Encounter for screening for malignant neoplasm of respiratory organs: Secondary | ICD-10-CM | POA: Insufficient documentation

## 2023-02-08 DIAGNOSIS — Z87891 Personal history of nicotine dependence: Secondary | ICD-10-CM | POA: Insufficient documentation

## 2023-02-08 DIAGNOSIS — J439 Emphysema, unspecified: Secondary | ICD-10-CM | POA: Insufficient documentation

## 2023-02-13 ENCOUNTER — Ambulatory Visit (INDEPENDENT_AMBULATORY_CARE_PROVIDER_SITE_OTHER): Payer: HMO | Admitting: Urology

## 2023-02-13 ENCOUNTER — Telehealth: Payer: Self-pay

## 2023-02-13 ENCOUNTER — Encounter: Payer: Self-pay | Admitting: Urology

## 2023-02-13 ENCOUNTER — Other Ambulatory Visit: Payer: Self-pay | Admitting: Urology

## 2023-02-13 VITALS — BP 174/98 | HR 76 | Ht 71.0 in | Wt 214.0 lb

## 2023-02-13 DIAGNOSIS — N401 Enlarged prostate with lower urinary tract symptoms: Secondary | ICD-10-CM

## 2023-02-13 DIAGNOSIS — N138 Other obstructive and reflux uropathy: Secondary | ICD-10-CM

## 2023-02-13 MED ORDER — TAMSULOSIN HCL 0.4 MG PO CAPS: 0.4000 mg | ORAL_CAPSULE | Freq: Every day | ORAL | 11 refills | Status: DC

## 2023-02-13 NOTE — Patient Instructions (Signed)

## 2023-02-13 NOTE — Telephone Encounter (Signed)
I spoke with Mr. Migneault. We have discussed possible surgery dates and Friday April 5th, 2024 was agreed upon by all parties. Patient given information about surgery date, what to expect pre-operatively and post operatively.  We discussed that a Pre-Admission Testing office will be calling to set up the pre-op visit that will take place prior to surgery, and that these appointments are typically done over the phone with a Pre-Admissions RN. Informed patient that our office will communicate any additional care to be provided after surgery. Patients questions or concerns were discussed during our call. Advised to call our office should there be any additional information, questions or concerns that arise. Patient verbalized understanding.

## 2023-02-13 NOTE — Progress Notes (Signed)
   02/13/2023 4:27 PM   Adrian Hart 1951-02-08 [id number]  Reason for visit: Urinary retention, low risk prostate cancer, discuss HOLEP  HPI: 72 year old male has been followed by Dr. Bernardo Heater for low risk prostate cancer on surveillance and developed urinary retention after a knee surgery in January 2024.  He reportedly failed 3 voiding trials, and was unable to perform CIC.  Prostate volume was 70 g in 2021.  Recent cystoscopy showed enlarged prostate with elevated bladder neck, no bladder lesions or bladder stones, moderate trabeculation.  He was referred to discuss HOLEP.  Interestingly, he denies any significant urinary symptoms prior to his retention, aside from some weak stream.  He has not been on any Flomax.  He is very bothered by the catheter.  We discussed the risks and benefits of HoLEP at length.  The procedure requires general anesthesia and takes 1 to 2 hours, and a holmium laser is used to enucleate the prostate and push this tissue into the bladder.  A morcellator is then used to remove this tissue, which is sent for pathology.  The vast majority(>95%) of patients are able to discharge the same day with a catheter in place for 2 to 3 days, and will follow-up in clinic for a voiding trial.  We specifically discussed the risks of bleeding, infection, retrograde ejaculation, temporary urgency and urge incontinence, very low risk of long-term incontinence, urethral stricture/bladder neck contracture, pathologic evaluation of prostate tissue and possible detection of prostate cancer or other malignancy, and possible need for additional procedures.  I do think he would benefit significantly from HOLEP.  He was interested in 1 final voiding trial after starting Flomax, as he has an upcoming trip to [place] later this month and would like to not have the catheter if at all possible.  I think this is reasonable since he has not been on Flomax over the last few months.  Anticipate  scheduling HOLEP in early April when he returns from his trip-if he passes his voiding trial and doing very well, could consider deferring surgery, but anticipate high risk of recurrent retention based on his history and moderate bladder trabeculations on recent cystoscopy.  Start Flomax, void trial early next week Schedule HOLEP for early April  Renwick Asman C Sha Amer, MD  [facility] [address] [place], [place] [address] [phone]

## 2023-02-13 NOTE — Progress Notes (Signed)
Surgical Physician Order Form [facility]   * Scheduling expectation :  Early April  *Length of Case: 2 hours  *Clearance needed: no  *Anticoagulation Instructions: Hold all anticoagulants  *Aspirin Instructions: Hold Aspirin  *Post-op visit Date/Instructions:  1-3 day cath removal  *Diagnosis: BPH w/BOO  *Procedure:     HOLEP [id number])   Additional orders: N/A  -Admit type: OUTpatient  -Anesthesia: General  -VTE Prophylaxis Standing Order SCD's       Other:   -Standing Lab Orders Per Anesthesia    Lab other: UA&Urine Culture  -Standing Test orders EKG/Chest x-ray per Anesthesia       Test other:   - Medications:  Ancef 2gm IV  -Other orders:  N/A

## 2023-02-13 NOTE — Progress Notes (Signed)
    [facility]  Surgery Date: Date: 03/08/2023  Surgeon: Dr. Nickolas Madrid, MD  Inpt ( No  )   Outpt (Yes)   Obs ( No  )   Diagnosis: N40.1, N13.8 Benign Prostatic Hyperplasia with Urinary Obstruction  -CPT: 50354  Surgery: Holmium Laser Enucleation of the Prostate  Stop Anticoagulations: Yes and also hold ASA  Cardiac/Medical/Pulmonary Clearance needed: no  *Orders entered into EPIC  Date: 02/13/23   *Case booked in [place]  Date: 02/13/23  *Notified pt of Surgery: Date: 02/13/23  PRE-OP UA & CX: yes, will obtain in clinic next week on 02/20/2023  *Placed into Prior Authorization Work Fabio Bering Date: 02/13/23  Assistant/laser/rep:No

## 2023-02-18 DIAGNOSIS — J449 Chronic obstructive pulmonary disease, unspecified: Secondary | ICD-10-CM | POA: Diagnosis not present

## 2023-02-18 DIAGNOSIS — F3342 Major depressive disorder, recurrent, in full remission: Secondary | ICD-10-CM | POA: Diagnosis not present

## 2023-02-18 DIAGNOSIS — C61 Malignant neoplasm of prostate: Secondary | ICD-10-CM | POA: Diagnosis not present

## 2023-02-18 DIAGNOSIS — I10 Essential (primary) hypertension: Secondary | ICD-10-CM | POA: Diagnosis not present

## 2023-02-18 DIAGNOSIS — Z87891 Personal history of nicotine dependence: Secondary | ICD-10-CM | POA: Diagnosis not present

## 2023-02-18 DIAGNOSIS — I700 Atherosclerosis of aorta: Secondary | ICD-10-CM | POA: Diagnosis not present

## 2023-02-19 DIAGNOSIS — I2510 Atherosclerotic heart disease of native coronary artery without angina pectoris: Secondary | ICD-10-CM | POA: Diagnosis not present

## 2023-02-19 NOTE — Progress Notes (Signed)
02/20/2023 3:43 PM   Adrian Hart 1951/09/03 [id number]  Referring provider: Kirk Ruths, MD [facility] [place],  [place] [address]  Urological history: 1.  Low risk prostate cancer -PSA (05/2022) 2.5 -cT1c adenocarcinoma prostate low risk  -Biopsy 08/2018; abnormal DRE; PSA 2.7; 66 g prostate  -4/12 cores positive Gleason 3+3 (LLB, LLM, LM, LA)  -1-25% involvement  -Prostate MRI (12/18/18) w/2 PI-RADS 4 lesions left base and left mid gland concerning for high-grade carcinoma, no adenopathy, capsule intact, normal seminal vesicles  -Fusion Biopsy 01/2019: PI-RADS 4 lesions were benign, left side of biopsy positive Gleason 3+3  -Elected surveillance   2. BPH w/ LU TS  3. ED -Contributing factors of age, prostate cancer, BPH, coronary artery disease, elevated hemoglobin A1c, hyperlipidemia, COPD, anxiety, smoking history -Tadalafil 20 mg, on-demand dosing  HPI: Adrian Hart is a 72 y.o.male who presents trial of void.    Foley catheter removed.   He has voided 6 times today.  Patient denies any modifying or aggravating factors.  Patient denies any gross hematuria, dysuria or suprapubic/flank pain.  Patient denies any fevers, chills, nausea or vomiting.    PVR 173 mL   PMH: Past Medical History:  Diagnosis Date   Anxiety    Arthritis    COPD (chronic obstructive pulmonary disease) (HCC)    Coronary artery disease    GERD (gastroesophageal reflux disease)    PT TAKES APPLE CIDER VINEGAR    Hypertension    borderline pressures.  takes no meds   PONV (postoperative nausea and vomiting)    also - urinarty retention after knee surgery (12/25/22)   Prostate cancer ([place])     needs no treatment. still on observation   Thoracic aortic atherosclerosis [facility])     Surgical History: Past Surgical History:  Procedure Laterality Date   CATARACT EXTRACTION W/PHACO Left 01/07/2023   Procedure: CATARACT EXTRACTION PHACO AND INTRAOCULAR  LENS PLACEMENT (IOC) LEFT 2.79 00:27.1;  Surgeon: Eulogio Bear, MD;  Location: [place];  Service: Ophthalmology;  Laterality: Left;   COLONOSCOPY  2003   Dr Alveta Heimlich   COLONOSCOPY WITH PROPOFOL N/A 05/02/2018   Procedure: COLONOSCOPY WITH PROPOFOL;  Surgeon: Manya Silvas, MD;  Location: [facility] END[facility]COPY;  Service: Endoscopy;  Laterality: N/A;   EYE SURGERY Right    cataract   FLEXIBLE SIGMOIDOSCOPY  1996   Dr Bary Castilla   HEMORRHOID SURGERY  04-20-04   Dr Bary Castilla   HERNIA REPAIR  [id number]   Umbilical, primary repair   KNEE ARTHROSCOPY Right 12/20/2022   Procedure: RIGHT KNEE ARTHROSCOPY WITH DEBRIDEMENT, PARTIAL LATERAL MENISCECTOMY, AND EXCISION OF LOOSE BODY RIGHT KNEE.;  Surgeon: Corky Mull, MD;  Location: [facility];  Service: Orthopedics;  Laterality: Right;   KNEE ARTHROSCOPY Right 12/25/2022   Procedure: RIGHT KNEE ARTHROSCOPY WITH IRRIGATION AND DEBRIDEMENT.;  Surgeon: Corky Mull, MD;  Location: [facility];  Service: Orthopedics;  Laterality: Right;   KNEE SURGERY Left 1995   arthroscopy   PARTIAL KNEE ARTHROPLASTY Right 09/05/2017   Procedure: UNICOMPARTMENTAL KNEE;  Surgeon: Corky Mull, MD;  Location: [facility];  Service: Orthopedics;  Laterality: Right;   PARTIAL KNEE ARTHROPLASTY Left 09/01/2019   Procedure: UNICOMPARTMENTAL KNEE;  Surgeon: Corky Mull, MD;  Location: [facility];  Service: Orthopedics;  Laterality: Left;   TONSILLECTOMY     UMBILICAL HERNIA REPAIR N/A 09/27/2015   Procedure: HERNIA REPAIR UMBILICAL ADULT;  Surgeon: Robert Bellow, MD;  Location: [facility];  Service: General;  Laterality: N/A;   XI ROBOTIC ASSISTED INGUINAL HERNIA REPAIR WITH MESH Left 07/28/2020   Procedure: XI ROBOTIC ASSISTED INGUINAL HERNIA REPAIR WITH MESH;  Surgeon: Jules Husbands, MD;  Location: [facility];  Service: General;  Laterality: Left;    Home Medications:  Allergies as of 02/20/2023       Reactions   Fish-derived Products Nausea And Vomiting   Reaction to  mussells only        Medication List        Accurate as of February 20, 2023  3:43 PM. If you have any questions, ask your nurse or doctor.          bisacodyl 5 MG EC tablet Commonly known as: DULCOLAX Take 1 tablet (5 mg total) by mouth daily as needed for moderate constipation.   escitalopram 20 MG tablet Commonly known as: LEXAPRO Take 20 mg by mouth daily.   Ginseng 100 MG Caps Take 100 mg by mouth daily.   MACA PO Take by mouth.   Multi-Vitamins Tabs Take 1 tablet by mouth daily.   omeprazole 40 MG capsule Commonly known as: PRILOSEC Take 40 mg by mouth daily.   PSYLLIUM PO Take 3 capsules by mouth 2 (two) times daily.   rosuvastatin 10 MG tablet Commonly known as: CRESTOR Take 10 mg by mouth daily.   tadalafil 20 MG tablet Commonly known as: CIALIS Take 1 tablet (20 mg total) by mouth daily as needed for erectile dysfunction.   tadalafil 5 MG tablet Commonly known as: CIALIS Take 1 tablet (5 mg total) by mouth daily as needed for erectile dysfunction.   tamsulosin 0.4 MG Caps capsule Commonly known as: FLOMAX Take 1 capsule (0.4 mg total) by mouth daily.   traZODone 50 MG tablet Commonly known as: DESYREL Take 25 mg by mouth at bedtime as needed for sleep.   VITAMIN B 12 PO Take by mouth 3 (three) times a week.   ZINC PO Take by mouth.        Allergies:  Allergies  Allergen Reactions   Fish-Derived Products Nausea And Vomiting    Reaction to mussells only    Family History: Family History  Problem Relation Age of Onset   Diabetes Mother    Colon cancer Maternal Aunt    Prostate cancer Maternal Uncle    Bladder Cancer Neg Hx    Kidney cancer Neg Hx     Social History:  reports that he quit smoking about 15 years ago. His smoking use included cigarettes. He has a 30.00 pack-year smoking history. He has been exposed to tobacco smoke. He has never used smokeless tobacco. He reports that he does not drink alcohol and does not use  drugs.  ROS: Pertinent ROS in HPI  Physical Exam: Blood pressure 134/82, pulse 68, height 5\' 11"  (1.803 m), weight 214 lb (97.1 kg). Constitutional:  Well nourished. Alert and oriented, No acute distress. HEENT: [place] AT, moist mucus membranes.  Trachea midline Cardiovascular: No clubbing, cyanosis, or edema. Respiratory: Normal respiratory effort, no increased work of breathing. Neurologic: Grossly intact, no focal deficits, moving all 4 extremities. Psychiatric: Normal mood and affect.   Laboratory Data: N/A   Pertinent Imaging:  02/20/23 15:27  Scan Result 173     Assessment & Plan:    1. Urinary retention:    - he passed his TOV  -plans for HoLEP - UA sent for culture  2. Prostate cancer -Low-grade prostate cancer on  active surveillance -Has an appointment in June for PSA and office visit with Dr. Bernardo Heater  Return for follow up as scheduled .  These notes generated with voice recognition software. I apologize for typographical errors.  [place], [place] [address]  [place], [place] [address] [phone]

## 2023-02-20 ENCOUNTER — Encounter: Payer: Self-pay | Admitting: Urology

## 2023-02-20 ENCOUNTER — Ambulatory Visit: Payer: HMO | Admitting: Urology

## 2023-02-20 ENCOUNTER — Ambulatory Visit (INDEPENDENT_AMBULATORY_CARE_PROVIDER_SITE_OTHER): Payer: HMO | Admitting: Urology

## 2023-02-20 DIAGNOSIS — N138 Other obstructive and reflux uropathy: Secondary | ICD-10-CM

## 2023-02-20 DIAGNOSIS — N401 Enlarged prostate with lower urinary tract symptoms: Secondary | ICD-10-CM | POA: Diagnosis not present

## 2023-02-20 DIAGNOSIS — R339 Retention of urine, unspecified: Secondary | ICD-10-CM

## 2023-02-20 DIAGNOSIS — C61 Malignant neoplasm of prostate: Secondary | ICD-10-CM | POA: Diagnosis not present

## 2023-02-20 LAB — BLADDER SCAN AMB NON-IMAGING: Scan Result: 173

## 2023-02-20 NOTE — Progress Notes (Signed)
Catheter Removal  Patient is present today for a catheter removal.  3ml of water was drained from the balloon. A 18 coudeFR foley cath was removed from the bladder, no complications were noted. Patient tolerated well.  Performed by: Elberta Leatherwood, CMA  Follow up/ Additional notes: PM PVR

## 2023-02-21 LAB — URINALYSIS, COMPLETE
Bilirubin, UA: NEGATIVE
Glucose, UA: NEGATIVE
Ketones, UA: NEGATIVE
Nitrite, UA: NEGATIVE
Protein,UA: NEGATIVE
Specific Gravity, UA: 1.015 (ref 1.005–1.030)
Urobilinogen, Ur: 0.2 mg/dL (ref 0.2–1.0)
pH, UA: 7 (ref 5.0–7.5)

## 2023-02-21 LAB — MICROSCOPIC EXAMINATION: RBC, Urine: >30 /hpf — AB (ref 0–2)

## 2023-02-23 LAB — CULTURE, URINE COMPREHENSIVE

## 2023-02-25 ENCOUNTER — Ambulatory Visit (INDEPENDENT_AMBULATORY_CARE_PROVIDER_SITE_OTHER): Payer: HMO | Admitting: Physician Assistant

## 2023-02-25 ENCOUNTER — Encounter: Payer: Self-pay | Admitting: Physician Assistant

## 2023-02-25 VITALS — BP 133/84 | HR 83 | Ht 71.0 in | Wt 214.0 lb

## 2023-02-25 DIAGNOSIS — R339 Retention of urine, unspecified: Secondary | ICD-10-CM

## 2023-02-25 DIAGNOSIS — N3000 Acute cystitis without hematuria: Secondary | ICD-10-CM | POA: Diagnosis not present

## 2023-02-25 LAB — BLADDER SCAN AMB NON-IMAGING: Scan Result: 0

## 2023-02-25 MED ORDER — PHENAZOPYRIDINE HCL 200 MG PO TABS: 200.0000 mg | ORAL_TABLET | Freq: Three times a day (TID) | ORAL | 0 refills | Status: DC | PRN

## 2023-02-25 MED ORDER — AMOXICILLIN-POT CLAVULANATE 875-125 MG PO TABS: 1.0000 | ORAL_TABLET | Freq: Two times a day (BID) | ORAL | 0 refills | Status: AC

## 2023-02-25 NOTE — Progress Notes (Signed)
02/25/2023 11:37 AM   Adrian Hart 01/04/1951 [id number]  CC: Chief Complaint  Patient presents with   Urinary Retention   HPI: Adrian Hart is a 72 y.o. male with PMH low risk prostate cancer on surveillance, BPH with recent history of urinary retention scheduled for HOLEP with Dr. Diamantina Providence next week, and ED on tadalafil who presents today for repeat PVR after passing an outpatient voiding trial with Adrian Hart 5 days ago.   Urine culture from 5 days ago has finalized with MDR E. coli.  Today he reports he has continued to void without difficulty, however he notes bothersome dysuria and malodorous urine.  PVR 0 mL.  PMH: Past Medical History:  Diagnosis Date   Anxiety    Arthritis    COPD (chronic obstructive pulmonary disease) (HCC)    Coronary artery disease    GERD (gastroesophageal reflux disease)    PT TAKES APPLE CIDER VINEGAR    Hypertension    borderline pressures.  takes no meds   PONV (postoperative nausea and vomiting)    also - urinarty retention after knee surgery (12/25/22)   Prostate cancer ([place])     needs no treatment. still on observation   Thoracic aortic atherosclerosis [facility])     Surgical History: Past Surgical History:  Procedure Laterality Date   CATARACT EXTRACTION W/PHACO Left 01/07/2023   Procedure: CATARACT EXTRACTION PHACO AND INTRAOCULAR LENS PLACEMENT (IOC) LEFT 2.79 00:27.1;  Surgeon: Eulogio Bear, MD;  Location: [place];  Service: Ophthalmology;  Laterality: Left;   COLONOSCOPY  2003   Dr Alveta Heimlich   COLONOSCOPY WITH PROPOFOL N/A 05/02/2018   Procedure: COLONOSCOPY WITH PROPOFOL;  Surgeon: Manya Silvas, MD;  [facility];  Service: Endoscopy;  Laterality: N/A;   EYE SURGERY Right    cataract   FLEXIBLE SIGMOIDOSCOPY  1996   Dr Bary Castilla   HEMORRHOID SURGERY  04-20-04   Dr Bary Castilla   HERNIA REPAIR  [id number]   Umbilical, primary repair   KNEE ARTHROSCOPY Right 12/20/2022   Procedure: RIGHT KNEE  ARTHROSCOPY WITH DEBRIDEMENT, PARTIAL LATERAL MENISCECTOMY, AND EXCISION OF LOOSE BODY RIGHT KNEE.;  Surgeon: Corky Mull, MD;  Location: [facility];  Service: Orthopedics;  Laterality: Right;   KNEE ARTHROSCOPY Right 12/25/2022   Procedure: RIGHT KNEE ARTHROSCOPY WITH IRRIGATION AND DEBRIDEMENT.;  Surgeon: Corky Mull, MD;  Location: [facility];  Service: Orthopedics;  Laterality: Right;   KNEE SURGERY Left 1995   arthroscopy   PARTIAL KNEE ARTHROPLASTY Right 09/05/2017   Procedure: UNICOMPARTMENTAL KNEE;  Surgeon: Corky Mull, MD;  Location: [facility];  Service: Orthopedics;  Laterality: Right;   PARTIAL KNEE ARTHROPLASTY Left 09/01/2019   Procedure: UNICOMPARTMENTAL KNEE;  Surgeon: Corky Mull, MD;  Location: [facility];  Service: Orthopedics;  Laterality: Left;   TONSILLECTOMY     UMBILICAL HERNIA REPAIR N/A 09/27/2015   Procedure: HERNIA REPAIR UMBILICAL ADULT;  Surgeon: Robert Bellow, MD;  Location: [facility];  Service: General;  Laterality: N/A;   XI ROBOTIC ASSISTED INGUINAL HERNIA REPAIR WITH MESH Left 07/28/2020   Procedure: XI ROBOTIC ASSISTED INGUINAL HERNIA REPAIR WITH MESH;  Surgeon: Jules Husbands, MD;  Location: [facility];  Service: General;  Laterality: Left;    Home Medications:  Allergies as of 02/25/2023       Reactions   Fish-derived Products Nausea And Vomiting   Reaction to mussells only        Medication List  Accurate as of February 25, 2023 11:37 AM. If you have any questions, ask your nurse or doctor.          amoxicillin-clavulanate 875-125 MG tablet Commonly known as: AUGMENTIN Take 1 tablet by mouth 2 (two) times daily for 10 days. Started by: Debroah Loop, PA-C   bisacodyl 5 MG EC tablet Commonly known as: DULCOLAX Take 1 tablet (5 mg total) by mouth daily as needed for moderate constipation.   escitalopram 20 MG tablet Commonly known as: LEXAPRO Take 20 mg by mouth daily.   Ginseng 100 MG Caps Take 100 mg by mouth daily.    MACA PO Take by mouth.   Multi-Vitamins Tabs Take 1 tablet by mouth daily.   omeprazole 40 MG capsule Commonly known as: PRILOSEC Take 40 mg by mouth daily.   phenazopyridine 200 MG tablet Commonly known as: Pyridium Take 1 tablet (200 mg total) by mouth 3 (three) times daily as needed for pain. Started by: Debroah Loop, PA-C   PSYLLIUM PO Take 3 capsules by mouth 2 (two) times daily.   rosuvastatin 10 MG tablet Commonly known as: CRESTOR Take 10 mg by mouth daily.   tadalafil 20 MG tablet Commonly known as: CIALIS Take 1 tablet (20 mg total) by mouth daily as needed for erectile dysfunction.   tadalafil 5 MG tablet Commonly known as: CIALIS Take 1 tablet (5 mg total) by mouth daily as needed for erectile dysfunction.   tamsulosin 0.4 MG Caps capsule Commonly known as: FLOMAX Take 1 capsule (0.4 mg total) by mouth daily.   traZODone 50 MG tablet Commonly known as: DESYREL Take 25 mg by mouth at bedtime as needed for sleep.   VITAMIN B 12 PO Take by mouth 3 (three) times a week.   ZINC PO Take by mouth.        Allergies:  Allergies  Allergen Reactions   Fish-Derived Products Nausea And Vomiting    Reaction to mussells only    Family History: Family History  Problem Relation Age of Onset   Diabetes Mother    Colon cancer Maternal Aunt    Prostate cancer Maternal Uncle    Bladder Cancer Neg Hx    Kidney cancer Neg Hx     Social History:   reports that he quit smoking about 15 years ago. His smoking use included cigarettes. He has a 30.00 pack-year smoking history. He has been exposed to tobacco smoke. He has never used smokeless tobacco. He reports that he does not drink alcohol and does not use drugs.  Physical Exam: BP 133/84   Pulse 83   Ht 5\' 11"  (1.803 m)   Wt 214 lb (97.1 kg)   BMI 29.85 kg/m   Constitutional:  Alert and oriented, no acute distress, nontoxic appearing HEENT: [place], AT Cardiovascular: No clubbing, cyanosis, or  edema Respiratory: Normal respiratory effort, no increased work of breathing Skin: No rashes, bruises or suspicious lesions Neurologic: Grossly intact, no focal deficits, moving all 4 extremities Psychiatric: Normal mood and affect  Laboratory Data: Results for orders placed or performed in visit on 02/25/23  Bladder Scan (Post Void Residual) in office  Result Value Ref Range   Scan Result 0    Assessment & Plan:   1. Acute cystitis without hematuria Emptying appropriately.  Will start culture appropriate Augmentin x 10 days.  Also prescribing Pyridium for dysuria relief.  He is in agreement with this plan. - Bladder Scan (Post Void Residual) in office - amoxicillin-clavulanate (AUGMENTIN) 875-125 MG  tablet; Take 1 tablet by mouth 2 (two) times daily for 10 days.  Dispense: 20 tablet; Refill: 0 - phenazopyridine (PYRIDIUM) 200 MG tablet; Take 1 tablet (200 mg total) by mouth 3 (three) times daily as needed for pain.  Dispense: 10 tablet; Refill: 0  Return if symptoms worsen or fail to improve.  Debroah Loop, PA-C  [facility] [address] [place], [place] [address] [phone]

## 2023-02-28 ENCOUNTER — Other Ambulatory Visit: Payer: Self-pay

## 2023-02-28 ENCOUNTER — Encounter
Admission: RE | Admit: 2023-02-28 | Discharge: 2023-02-28 | Disposition: A | Discharge status: Home / Self Care | Payer: HMO | Source: Ambulatory Visit | Attending: Urology | Admitting: Urology

## 2023-02-28 NOTE — Patient Instructions (Signed)
Your procedure is scheduled on: Friday 03/08/23 To find out your arrival time, please call [phone] between [place] on:   Thursday 03/07/23 Report to the Registration Desk on the 1st floor of the [facility]. Valet parking is available.  If your arrival time is 6:00 am, do not arrive before that time as the [facility] entrance doors do not open until 6:00 am.  REMEMBER: Instructions that are not followed completely may result in serious medical risk, up to and including death; or upon the discretion of your surgeon and anesthesiologist your surgery may need to be rescheduled.  Do not eat food or drink liquids after midnight the night before surgery.  No gum chewing or hard candies.  One week prior to surgery: Stop Anti-inflammatories (NSAIDS) such as Advil, Aleve, Ibuprofen, Motrin, Naproxen, Naprosyn and Aspirin based products such as Excedrin, Goody's Powder, BC Powder. You may however, continue to take Tylenol if needed for pain up until the day of surgery.  Stop ANY OVER THE COUNTER supplements until after surgery.  Continue taking all prescribed medications with the exception of the following: Stop taking tadalafil (CIALIS) 5 MG tablet 2 days before your surgery  TAKE ONLY THESE MEDICATIONS THE MORNING OF SURGERY WITH A SIP OF WATER:  escitalopram (LEXAPRO) 20 MG tablet  omeprazole (PRILOSEC) 40 MG capsule Antacid (take one the night before and one on the morning of surgery - helps to prevent nausea after surgery.) rosuvastatin (CRESTOR) 10 MG tablet  tamsulosin (FLOMAX) 0.4 MG CAPS capsule   No Alcohol for 24 hours before or after surgery.  No Smoking including e-cigarettes for 24 hours before surgery.  No chewable tobacco products for at least 6 hours before surgery.  No nicotine patches on the day of surgery.  Do not use any "recreational" drugs for at least a week (preferably 2 weeks) before your surgery.  Please be advised that the combination of cocaine and  anesthesia may have negative outcomes, up to and including death. If you test positive for cocaine, your surgery will be cancelled.  On the morning of surgery brush your teeth with toothpaste and water, you may rinse your mouth with mouthwash if you wish. Do not swallow any toothpaste or mouthwash.  Use CHG Soap or wipes as directed on instruction sheet. Shower with your regular soap.  Do not wear lotions, powders, or perfumes.   Do not shave body hair from the neck down 48 hours before surgery.  Wear comfortable clothing (specific to your surgery type) to the hospital.  Do not wear jewelry, make-up, hairpins, clips or nail polish.  Contact lenses, hearing aids and dentures may not be worn into surgery.  Do not bring valuables to the hospital. [facility] is not responsible for any missing/lost belongings or valuables.   Notify your doctor if there is any change in your medical condition (cold, fever, infection).  If you are being discharged the day of surgery, you will not be allowed to drive home. You will need a responsible individual to drive you home and stay with you for 24 hours after surgery.   If you are taking public transportation, you will need to have a responsible individual with you.  If you are being admitted to the hospital overnight, leave your suitcase in the car. After surgery it may be brought to your room.  In case of increased patient census, it may be necessary for you, the patient, to continue your postoperative care in the Same Day Surgery  department.  After surgery, you can help prevent lung complications by doing breathing exercises.  Take deep breaths and cough every 1-2 hours. Your doctor may order a device called an Incentive Spirometer to help you take deep breaths. When coughing or sneezing, hold a pillow firmly against your incision with both hands. This is called "splinting." Doing this helps protect your incision. It also decreases belly  discomfort.  Surgery Visitation Policy:  Patients undergoing a surgery or procedure may have two family members or support persons with them as long as the person is not COVID-19 positive or experiencing its symptoms.   Inpatient Visitation:    Visiting hours are 7 a.m. to 8 p.m. Up to four visitors are allowed at one time in a patient room. The visitors may rotate out with other people during the day. One designated support person (adult) may remain overnight.  Please call the [place] Dept. at [phone] if you have any questions about these instructions.

## 2023-03-08 ENCOUNTER — Ambulatory Visit
Admission: RE | Admit: 2023-03-08 | Discharge: 2023-03-08 | Disposition: A | Discharge status: Home / Self Care | Payer: HMO | Source: Ambulatory Visit | Attending: Urology | Admitting: Urology

## 2023-03-08 ENCOUNTER — Encounter
Admission: RE | Disposition: A | Discharge status: Home / Self Care | Payer: Self-pay | Source: Ambulatory Visit | Attending: Urology

## 2023-03-08 ENCOUNTER — Encounter: Payer: Self-pay | Admitting: Urology

## 2023-03-08 ENCOUNTER — Other Ambulatory Visit: Payer: Self-pay | Admitting: Urology

## 2023-03-08 ENCOUNTER — Other Ambulatory Visit: Payer: Self-pay | Admitting: *Deleted

## 2023-03-08 ENCOUNTER — Ambulatory Visit: Payer: HMO | Admitting: Certified Registered"

## 2023-03-08 ENCOUNTER — Ambulatory Visit: Payer: HMO | Admitting: Urgent Care

## 2023-03-08 ENCOUNTER — Other Ambulatory Visit: Payer: Self-pay

## 2023-03-08 DIAGNOSIS — R338 Other retention of urine: Secondary | ICD-10-CM | POA: Diagnosis not present

## 2023-03-08 DIAGNOSIS — I10 Essential (primary) hypertension: Secondary | ICD-10-CM | POA: Insufficient documentation

## 2023-03-08 DIAGNOSIS — C61 Malignant neoplasm of prostate: Secondary | ICD-10-CM | POA: Insufficient documentation

## 2023-03-08 DIAGNOSIS — N138 Other obstructive and reflux uropathy: Secondary | ICD-10-CM | POA: Diagnosis not present

## 2023-03-08 DIAGNOSIS — K219 Gastro-esophageal reflux disease without esophagitis: Secondary | ICD-10-CM | POA: Diagnosis not present

## 2023-03-08 DIAGNOSIS — Z8042 Family history of malignant neoplasm of prostate: Secondary | ICD-10-CM | POA: Diagnosis not present

## 2023-03-08 DIAGNOSIS — J449 Chronic obstructive pulmonary disease, unspecified: Secondary | ICD-10-CM | POA: Diagnosis not present

## 2023-03-08 DIAGNOSIS — I2510 Atherosclerotic heart disease of native coronary artery without angina pectoris: Secondary | ICD-10-CM | POA: Insufficient documentation

## 2023-03-08 DIAGNOSIS — N401 Enlarged prostate with lower urinary tract symptoms: Secondary | ICD-10-CM | POA: Diagnosis not present

## 2023-03-08 DIAGNOSIS — Z79899 Other long term (current) drug therapy: Secondary | ICD-10-CM | POA: Diagnosis not present

## 2023-03-08 DIAGNOSIS — Z87891 Personal history of nicotine dependence: Secondary | ICD-10-CM | POA: Insufficient documentation

## 2023-03-08 DIAGNOSIS — F1290 Cannabis use, unspecified, uncomplicated: Secondary | ICD-10-CM | POA: Insufficient documentation

## 2023-03-08 HISTORY — PX: HOLEP-LASER ENUCLEATION OF THE PROSTATE WITH MORCELLATION: SHX6641

## 2023-03-08 SURGERY — ENUCLEATION, PROSTATE, USING LASER, WITH MORCELLATION
Anesthesia: General | Site: Prostate

## 2023-03-08 MED ORDER — DEXAMETHASONE SODIUM PHOSPHATE 10 MG/ML IJ SOLN
INTRAMUSCULAR | Status: DC | PRN
  Administered 2023-03-08: 10 mg via INTRAVENOUS

## 2023-03-08 MED ORDER — LACTATED RINGERS IV SOLN: INTRAVENOUS | Status: DC

## 2023-03-08 MED ORDER — CEFAZOLIN SODIUM-DEXTROSE 2-4 GM/100ML-% IV SOLN
INTRAVENOUS | Status: AC
  Filled 2023-03-08: qty 100

## 2023-03-08 MED ORDER — PROPOFOL 10 MG/ML IV BOLUS
INTRAVENOUS | Status: DC | PRN
  Administered 2023-03-08: 90 mg via INTRAVENOUS

## 2023-03-08 MED ORDER — ACETAMINOPHEN 10 MG/ML IV SOLN
INTRAVENOUS | Status: DC | PRN
  Administered 2023-03-08: 1000 mg via INTRAVENOUS

## 2023-03-08 MED ORDER — CIPROFLOXACIN IN D5W 400 MG/200ML IV SOLN
INTRAVENOUS | Status: AC
  Filled 2023-03-08: qty 200

## 2023-03-08 MED ORDER — STERILE WATER FOR IRRIGATION IR SOLN
Status: DC | PRN
  Administered 2023-03-08: 1000 mL

## 2023-03-08 MED ORDER — CHLORHEXIDINE GLUCONATE 0.12 % MT SOLN
15.0000 mL | Freq: Once | OROMUCOSAL | Status: AC
  Administered 2023-03-08: 15 mL via OROMUCOSAL

## 2023-03-08 MED ORDER — MIDAZOLAM HCL 2 MG/2ML IJ SOLN
INTRAMUSCULAR | Status: AC
  Filled 2023-03-08: qty 2

## 2023-03-08 MED ORDER — FENTANYL CITRATE (PF) 100 MCG/2ML IJ SOLN
INTRAMUSCULAR | Status: AC
  Filled 2023-03-08: qty 2

## 2023-03-08 MED ORDER — OXYCODONE HCL 5 MG/5ML PO SOLN: 5.0000 mg | Freq: Once | ORAL | Status: AC | PRN

## 2023-03-08 MED ORDER — ROCURONIUM BROMIDE 10 MG/ML (PF) SYRINGE
PREFILLED_SYRINGE | INTRAVENOUS | Status: AC
  Filled 2023-03-08: qty 10

## 2023-03-08 MED ORDER — OXYCODONE HCL 5 MG PO TABS
ORAL_TABLET | ORAL | Status: AC
  Filled 2023-03-08: qty 1

## 2023-03-08 MED ORDER — SUGAMMADEX SODIUM 200 MG/2ML IV SOLN
INTRAVENOUS | Status: DC | PRN
  Administered 2023-03-08: 184 mg via INTRAVENOUS

## 2023-03-08 MED ORDER — LIDOCAINE HCL (CARDIAC) PF 100 MG/5ML IV SOSY
PREFILLED_SYRINGE | INTRAVENOUS | Status: DC | PRN
  Administered 2023-03-08: 100 mg via INTRAVENOUS

## 2023-03-08 MED ORDER — ONDANSETRON HCL 4 MG/2ML IJ SOLN
INTRAMUSCULAR | Status: AC
  Filled 2023-03-08: qty 2

## 2023-03-08 MED ORDER — SODIUM CHLORIDE 0.9 % IR SOLN
Status: DC | PRN
  Administered 2023-03-08: 21000 mL

## 2023-03-08 MED ORDER — LIDOCAINE HCL (PF) 2 % IJ SOLN
INTRAMUSCULAR | Status: AC
  Filled 2023-03-08: qty 5

## 2023-03-08 MED ORDER — DEXAMETHASONE SODIUM PHOSPHATE 10 MG/ML IJ SOLN
INTRAMUSCULAR | Status: AC
  Filled 2023-03-08: qty 1

## 2023-03-08 MED ORDER — ORAL CARE MOUTH RINSE: 15.0000 mL | Freq: Once | OROMUCOSAL | Status: AC

## 2023-03-08 MED ORDER — CEFAZOLIN SODIUM-DEXTROSE 2-4 GM/100ML-% IV SOLN
2.0000 g | INTRAVENOUS | Status: AC
  Administered 2023-03-08: 2 g via INTRAVENOUS

## 2023-03-08 MED ORDER — HYDROCODONE-ACETAMINOPHEN 5-325 MG PO TABS: 1.0000 | ORAL_TABLET | Freq: Four times a day (QID) | ORAL | 0 refills | Status: DC | PRN

## 2023-03-08 MED ORDER — OXYCODONE HCL 5 MG PO TABS
5.0000 mg | ORAL_TABLET | Freq: Once | ORAL | Status: AC | PRN
  Administered 2023-03-08: 5 mg via ORAL

## 2023-03-08 MED ORDER — CHLORHEXIDINE GLUCONATE 0.12 % MT SOLN
OROMUCOSAL | Status: AC
  Filled 2023-03-08: qty 15

## 2023-03-08 MED ORDER — HYDROCODONE-ACETAMINOPHEN 5-325 MG PO TABS: 1.0000 | ORAL_TABLET | Freq: Four times a day (QID) | ORAL | 0 refills | Status: AC | PRN

## 2023-03-08 MED ORDER — ROCURONIUM BROMIDE 100 MG/10ML IV SOLN
INTRAVENOUS | Status: DC | PRN
  Administered 2023-03-08: 50 mg via INTRAVENOUS

## 2023-03-08 MED ORDER — MIDAZOLAM HCL 2 MG/2ML IJ SOLN
INTRAMUSCULAR | Status: DC | PRN
  Administered 2023-03-08: 2 mg via INTRAVENOUS

## 2023-03-08 MED ORDER — FENTANYL CITRATE (PF) 100 MCG/2ML IJ SOLN
INTRAMUSCULAR | Status: DC | PRN
  Administered 2023-03-08 (×2): 50 ug via INTRAVENOUS

## 2023-03-08 MED ORDER — ONDANSETRON HCL 4 MG/2ML IJ SOLN
INTRAMUSCULAR | Status: DC | PRN
  Administered 2023-03-08: 4 mg via INTRAVENOUS

## 2023-03-08 MED ORDER — ACETAMINOPHEN 10 MG/ML IV SOLN
INTRAVENOUS | Status: AC
  Filled 2023-03-08: qty 100

## 2023-03-08 MED ORDER — FENTANYL CITRATE (PF) 100 MCG/2ML IJ SOLN
25.0000 ug | INTRAMUSCULAR | Status: DC | PRN
  Administered 2023-03-08: 50 ug via INTRAVENOUS

## 2023-03-08 MED ORDER — PROPOFOL 10 MG/ML IV BOLUS
INTRAVENOUS | Status: AC
  Filled 2023-03-08: qty 20

## 2023-03-08 SURGICAL SUPPLY — 37 items
ADAPTER IRRIG TUBE 2 SPIKE SOL (ADAPTER) ×2 IMPLANT
ADPR TBG 2 SPK PMP STRL ASCP (ADAPTER) ×3
BAG DRN LRG CPC RND TRDRP CNTR (MISCELLANEOUS) ×1
BAG DRN RND TRDRP ANRFLXCHMBR (UROLOGICAL SUPPLIES) ×1
BAG URINE DRAIN 2000ML AR STRL (UROLOGICAL SUPPLIES) ×1 IMPLANT
BAG URO DRAIN 4000ML (MISCELLANEOUS) ×1 IMPLANT
CATH FOLEY 3WAY 30CC 24FR (CATHETERS) ×1
CATH URETL OPEN END 4X70 (CATHETERS) ×1 IMPLANT
CATH URTH STD 24FR FL 3W 2 (CATHETERS) ×1 IMPLANT
CONTAINER COLLECT MORCELLATR (MISCELLANEOUS) ×1 IMPLANT
DRAPE UTILITY 15X26 TOWEL STRL (DRAPES) IMPLANT
ELECT BIVAP BIPO 22/24 DONUT (ELECTROSURGICAL)
ELECTRD BIVAP BIPO 22/24 DONUT (ELECTROSURGICAL) IMPLANT
FIBER LASER MOSES 550 DFL (Laser) ×1 IMPLANT
FILTER OVERFLOW MORCELLATOR (FILTER) ×1 IMPLANT
GLOVE BIOGEL PI IND STRL 7.5 (GLOVE) ×1 IMPLANT
GOWN STRL REUS W/ TWL LRG LVL3 (GOWN DISPOSABLE) ×1 IMPLANT
GOWN STRL REUS W/ TWL XL LVL3 (GOWN DISPOSABLE) ×1 IMPLANT
GOWN STRL REUS W/TWL LRG LVL3 (GOWN DISPOSABLE) ×1
GOWN STRL REUS W/TWL XL LVL3 (GOWN DISPOSABLE) ×1
HOLDER FOLEY CATH W/STRAP (MISCELLANEOUS) ×1 IMPLANT
IV NS IRRIG 3000ML ARTHROMATIC (IV SOLUTION) ×5 IMPLANT
KIT TURNOVER CYSTO (KITS) ×1 IMPLANT
MBRN O SEALING YLW 17 FOR INST (MISCELLANEOUS) ×1
MEMBRANE SLNG YLW 17 FOR INST (MISCELLANEOUS) ×1 IMPLANT
MORCELLATOR COLLECT CONTAINER (MISCELLANEOUS) ×1
MORCELLATOR OVERFLOW FILTER (FILTER) ×1
MORCELLATOR ROTATION 4.75 335 (MISCELLANEOUS) ×1 IMPLANT
PACK CYSTO AR (MISCELLANEOUS) ×1 IMPLANT
SET CYSTO W/LG BORE CLAMP LF (SET/KITS/TRAYS/PACK) ×1 IMPLANT
SET IRRIG Y TYPE TUR BLADDER L (SET/KITS/TRAYS/PACK) ×1 IMPLANT
SLEEVE PROTECTION STRL DISP (MISCELLANEOUS) ×2 IMPLANT
SURGILUBE 2OZ TUBE FLIPTOP (MISCELLANEOUS) ×1 IMPLANT
SYR TOOMEY IRRIG 70ML (MISCELLANEOUS) ×1
SYRINGE TOOMEY IRRIG 70ML (MISCELLANEOUS) ×1 IMPLANT
TUBE PUMP MORCELLATOR PIRANHA (TUBING) ×1 IMPLANT
WATER STERILE IRR 1000ML POUR (IV SOLUTION) ×1 IMPLANT

## 2023-03-08 NOTE — Addendum Note (Signed)
Addended by: Sueanne Margarita on: 03/08/2023 02:00 PM   Modules accepted: Orders

## 2023-03-08 NOTE — H&P (Signed)
03/08/23 9:09 AM   Adrian Hart [birth year] [id number]  CC: BPH, low risk prostate cancer  HPI: 72 year old male has been followed by Dr. Lonna Cobb for low risk prostate cancer on surveillance and developed urinary retention after a knee surgery in January 2024.  He failed 3 voiding trials, and was unable to perform CIC.  Prostate volume was 70 g in 2021.  Recent cystoscopy showed enlarged prostate with elevated bladder neck, no bladder lesions or bladder stones, moderate trabeculation.  He ultimately passed a voiding trial on Flomax recently.   Prior to having the catheter his primary urinary complaint was weak urinary stream.  Most recent PSA from June 2023 normal at 2.5.     PMH: Past Medical History:  Diagnosis Date   Anxiety    Arthritis    COPD (chronic obstructive pulmonary disease)    Coronary artery disease    GERD (gastroesophageal reflux disease)    PT TAKES APPLE CIDER VINEGAR    Hypertension    borderline pressures.  takes no meds   PONV (postoperative nausea and vomiting)    PONV x1 several years ago...also - urinarty retention after knee surgery (12/25/22)   Prostate cancer     needs no treatment. still on observation   Thoracic aortic atherosclerosis     Surgical History: Past Surgical History:  Procedure Laterality Date   CATARACT EXTRACTION W/PHACO Left 01/07/2023   Procedure: CATARACT EXTRACTION PHACO AND INTRAOCULAR LENS PLACEMENT (IOC) LEFT 2.79 00:27.1;  Surgeon: Nevada Crane, MD;  Location: [facility];  Service: Ophthalmology;  Laterality: Left;   COLONOSCOPY  2003   Dr Thurmond Butts   COLONOSCOPY WITH PROPOFOL N/A 05/02/2018   Procedure: COLONOSCOPY WITH PROPOFOL;  Surgeon: Scot Jun, MD;  Location: [facility];  Service: Endoscopy;  Laterality: N/A;   EYE SURGERY Right    cataract   FLEXIBLE SIGMOIDOSCOPY  1996   Dr Lemar Livings   HEMORRHOID SURGERY  04-20-04   Dr Lemar Livings   HERNIA REPAIR  09/27/2015   Umbilical, primary repair    KNEE ARTHROSCOPY Right 12/20/2022   Procedure: RIGHT KNEE ARTHROSCOPY WITH DEBRIDEMENT, PARTIAL LATERAL MENISCECTOMY, AND EXCISION OF LOOSE BODY RIGHT KNEE.;  Surgeon: Christena Flake, MD;  Location: [facility];  Service: Orthopedics;  Laterality: Right;   KNEE ARTHROSCOPY Right 12/25/2022   Procedure: RIGHT KNEE ARTHROSCOPY WITH IRRIGATION AND DEBRIDEMENT.;  Surgeon: Christena Flake, MD;  Location: [facility];  Service: Orthopedics;  Laterality: Right;   KNEE SURGERY Left 1995   arthroscopy   PARTIAL KNEE ARTHROPLASTY Right 09/05/2017   Procedure: UNICOMPARTMENTAL KNEE;  Surgeon: Christena Flake, MD;  Location: [facility];  Service: Orthopedics;  Laterality: Right;   PARTIAL KNEE ARTHROPLASTY Left 09/01/2019   Procedure: UNICOMPARTMENTAL KNEE;  Surgeon: Christena Flake, MD;  Location: [facility];  Service: Orthopedics;  Laterality: Left;   TONSILLECTOMY     UMBILICAL HERNIA REPAIR N/A 09/27/2015   Procedure: HERNIA REPAIR UMBILICAL ADULT;  Surgeon: Earline Mayotte, MD;  Location: [facility];  Service: General;  Laterality: N/A;   XI ROBOTIC ASSISTED INGUINAL HERNIA REPAIR WITH MESH Left 07/28/2020   Procedure: XI ROBOTIC ASSISTED INGUINAL HERNIA REPAIR WITH MESH;  Surgeon: Leafy Ro, MD;  Location: [facility];  Service: General;  Laterality: Left;     Family History: Family History  Problem Relation Age of Onset   Diabetes Mother    Colon cancer Maternal Aunt    Prostate cancer Maternal Uncle    Bladder Cancer Neg Hx  Kidney cancer Neg Hx     Social History:  reports that he quit smoking about 15 years ago. His smoking use included cigarettes. He has a 30.00 pack-year smoking history. He has been exposed to tobacco smoke. He has never used smokeless tobacco. He reports current drug use. Drug: Marijuana. He reports that he does not drink alcohol.  Physical Exam: BP (!) 160/86   Pulse 79   Temp (!) 97.4 F (36.3 C) (Temporal)   Resp 18   Ht 5\' 11"  (1.803 m)   Wt 90.7 kg   SpO2 100%   BMI 27.89  kg/m    Constitutional:  Alert and oriented, No acute distress. Cardiovascular: Regular rate and rhythm Respiratory: Clear to auscultation bilaterally GI: Abdomen is soft, nontender, nondistended, no abdominal masses   Laboratory Data: Urine culture 02/20/2023 with E. coli, treated with antibiotics   Assessment & Plan:   72 year old male with stable low risk prostate cancer on surveillance as well as BPH who failed numerous voiding trials over the last few months after a knee procedure, weak urinary stream at baseline, interested in HOLEP for outlet procedure.  We discussed alternatives including finasteride or radical prostatectomy, and we discussed HOLEP is not a cancer surgery, but with his very low and stable PSA and low risk, low-volume disease HOLEP is very reasonable option with decreased side effect profile compared to radical prostatectomy.  We discussed the risks and benefits of HoLEP at length.  The procedure requires general anesthesia and takes 1 to 2 hours, and a holmium laser is used to enucleate the prostate and push this tissue into the bladder.  A morcellator is then used to remove this tissue, which is sent for pathology.  The vast majority(>95%) of patients are able to discharge the same day with a catheter in place for 2 to 3 days, and will follow-up in clinic for a voiding trial.  We specifically discussed the risks of bleeding, infection, retrograde ejaculation, temporary urgency and urge incontinence, very low risk of long-term incontinence, urethral stricture/bladder neck contracture, pathologic evaluation of prostate tissue and possible detection of prostate cancer or other malignancy, and possible need for additional procedures.  HOLEP today  Legrand Rams, MD 03/08/2023  [facility] [address] [place], [name] [address] [phone]

## 2023-03-08 NOTE — Discharge Instructions (Signed)

## 2023-03-08 NOTE — Anesthesia Preprocedure Evaluation (Signed)
Anesthesia Evaluation  Patient identified by MRN, date of birth, ID band Patient awake    Reviewed: Allergy & Precautions, NPO status , Patient's Chart, lab work & pertinent test results  History of Anesthesia Complications (+) PONV and history of anesthetic complications  Airway Mallampati: III  TM Distance: >3 FB Neck ROM: full    Dental  (+) Chipped   Pulmonary neg shortness of breath, COPD, former smoker   Pulmonary exam normal        Cardiovascular Exercise Tolerance: Good hypertension, + CAD  Normal cardiovascular exam     Neuro/Psych  PSYCHIATRIC DISORDERS      negative neurological ROS     GI/Hepatic Neg liver ROS,GERD  Controlled,,  Endo/Other  negative endocrine ROS    Renal/GU      Musculoskeletal   Abdominal   Peds  Hematology negative hematology ROS (+)   Anesthesia Other Findings Past Medical History: No date: Anxiety No date: Arthritis No date: COPD (chronic obstructive pulmonary disease) No date: Coronary artery disease No date: GERD (gastroesophageal reflux disease)     Comment:  PT TAKES APPLE CIDER VINEGAR  No date: Hypertension     Comment:  borderline pressures.  takes no meds No date: PONV (postoperative nausea and vomiting)     Comment:  PONV x1 several years ago...also - urinarty retention               after knee surgery (12/25/22) No date: Prostate cancer     Comment:   needs no treatment. still on observation No date: Thoracic aortic atherosclerosis  Past Surgical History: 01/07/2023: CATARACT EXTRACTION W/PHACO; Left     Comment:  Procedure: CATARACT EXTRACTION PHACO AND INTRAOCULAR               LENS PLACEMENT (IOC) LEFT 2.79 00:27.1;  Surgeon: Nevada Crane, MD;  Location: [facility];                Service: Ophthalmology;  Laterality: Left; 2003: COLONOSCOPY     Comment:  Dr Thurmond Butts 05/02/2018: COLONOSCOPY WITH PROPOFOL; N/A     Comment:   Procedure: COLONOSCOPY WITH PROPOFOL;  Surgeon: Scot Jun, MD;  Location: [facility] END[facility]COPY;  Service:               Endoscopy;  Laterality: N/A; No date: EYE SURGERY; Right     Comment:  cataract 1996: FLEXIBLE SIGMOIDOSCOPY     Comment:  Dr Lemar Livings 04-20-04: HEMORRHOID SURGERY     Comment:  Dr Lemar Livings 09/27/2015: HERNIA REPAIR     Comment:  Umbilical, primary repair 12/20/2022: KNEE ARTHROSCOPY; Right     Comment:  Procedure: RIGHT KNEE ARTHROSCOPY WITH DEBRIDEMENT,               PARTIAL LATERAL MENISCECTOMY, AND EXCISION OF LOOSE BODY               RIGHT KNEE.;  Surgeon: Christena Flake, MD;  Location: [facility]              [facility];  Service: Orthopedics;  Laterality: Right; 12/25/2022: KNEE ARTHROSCOPY; Right     Comment:  Procedure: RIGHT KNEE ARTHROSCOPY WITH IRRIGATION AND               DEBRIDEMENT.;  Surgeon: Christena Flake, MD;  Location:  [facility];  Service: Orthopedics;  Laterality: Right; 1995: KNEE SURGERY; Left     Comment:  arthroscopy 09/05/2017: PARTIAL KNEE ARTHROPLASTY; Right     Comment:  Procedure: UNICOMPARTMENTAL KNEE;  Surgeon: Christena FlakePoggi, John               J, MD;  Location: [facility];  Service: Orthopedics;                Laterality: Right; 09/01/2019: PARTIAL KNEE ARTHROPLASTY; Left     Comment:  Procedure: UNICOMPARTMENTAL KNEE;  Surgeon: Christena FlakePoggi, John               J, MD;  Location: [facility];  Service: Orthopedics;                Laterality: Left; No date: TONSILLECTOMY 09/27/2015: UMBILICAL HERNIA REPAIR; N/A     Comment:  Procedure: HERNIA REPAIR UMBILICAL ADULT;  Surgeon:               Earline MayotteJeffrey W Byrnett, MD;  Location: [facility];  Service:               General;  Laterality: N/A; 07/28/2020: XI ROBOTIC ASSISTED INGUINAL HERNIA REPAIR WITH MESH; Left     Comment:  Procedure: XI ROBOTIC ASSISTED INGUINAL HERNIA REPAIR               WITH MESH;  Surgeon: Leafy RoPabon, Diego F, MD;  Location: [facility]               ORS;  Service: General;  Laterality:  Left;  BMI    Body Mass Index: 27.89 kg/m      Reproductive/Obstetrics negative OB ROS                             Anesthesia Physical Anesthesia Plan  ASA: 3  Anesthesia Plan: General ETT   Post-op Pain Management:    Induction: Intravenous  PONV Risk Score and Plan: Ondansetron, Dexamethasone, Midazolam and Treatment may vary due to age or medical condition  Airway Management Planned: Oral ETT  Additional Equipment:   Intra-op Plan:   Post-operative Plan: Extubation in OR  Informed Consent: I have reviewed the patients History and Physical, chart, labs and discussed the procedure including the risks, benefits and alternatives for the proposed anesthesia with the patient or authorized representative who has indicated his/her understanding and acceptance.     Dental Advisory Given  Plan Discussed with: Anesthesiologist, CRNA and Surgeon  Anesthesia Plan Comments: (Thorough discussion with the patient regarding his history of urinary retention after anesthesia.  That this is still a risk. But that hopefully the risk will be lower because of this surgery and because he will be going home with a foley catheter inserted. Patient voiced understanding and assent.  Patient consented for risks of anesthesia including but not limited to:  - adverse reactions to medications - damage to eyes, teeth, lips or other oral mucosa - nerve damage due to positioning  - sore throat or hoarseness - Damage to heart, brain, nerves, lungs, other parts of body or loss of life  Patient voiced understanding.)       Anesthesia Quick Evaluation

## 2023-03-08 NOTE — Anesthesia Postprocedure Evaluation (Signed)
Anesthesia Post Note  Patient: Adrian Hart  Procedure(s) Performed: HOLEP-LASER ENUCLEATION OF THE PROSTATE WITH MORCELLATION (Prostate)  Patient location during evaluation: PACU Anesthesia Type: General Level of consciousness: awake and alert Pain management: pain level controlled Vital Signs Assessment: post-procedure vital signs reviewed and stable Respiratory status: spontaneous breathing, nonlabored ventilation and respiratory function stable Cardiovascular status: blood pressure returned to baseline and stable Postop Assessment: no apparent nausea or vomiting Anesthetic complications: no   No notable events documented.   Last Vitals:  Vitals:   03/08/23 1130 03/08/23 1149  BP: (!) 164/97 (!) 158/86  Pulse: 80 73  Resp: 16 15  Temp:  36.4 C  SpO2: 100% 97%    Last Pain:  Vitals:   03/08/23 1149  TempSrc: Temporal  PainSc: 0-No pain                 Foye Deer

## 2023-03-08 NOTE — Anesthesia Procedure Notes (Signed)
Procedure Name: Intubation Date/Time: 03/08/2023 9:31 AM  Performed by: Morene Crocker, CRNAPre-anesthesia Checklist: Patient identified, Patient being monitored, Timeout performed, Emergency Drugs available and Suction available Patient Re-evaluated:Patient Re-evaluated prior to induction Oxygen Delivery Method: Circle system utilized Preoxygenation: Pre-oxygenation with 100% oxygen Induction Type: IV induction Ventilation: Mask ventilation without difficulty Laryngoscope Size: 3 and McGraph Grade View: Grade I Tube type: Oral Tube size: 7.0 mm Number of attempts: 1 Airway Equipment and Method: Stylet Placement Confirmation: ETT inserted through vocal cords under direct vision, positive ETCO2 and breath sounds checked- equal and bilateral Secured at: 22 cm Tube secured with: Tape Dental Injury: Teeth and Oropharynx as per pre-operative assessment  Comments: Smooth atraumatic intubation, no complications noted.

## 2023-03-08 NOTE — Telephone Encounter (Signed)
Incoming fax from [facility], they are out of stock of the hydrocodone 5-325mg , called [facility] and [facility] have in stock. I have changed the pharmacy and pended the medication below.

## 2023-03-08 NOTE — Op Note (Signed)
Date of procedure: 03/08/23  Preoperative diagnosis:  BPH with obstruction  Postoperative diagnosis:  Same  Procedure: HoLEP (Holmium Laser Enucleation of the Prostate)  Surgeon: Legrand Rams, MD  Anesthesia: General  Complications: None  Intraoperative findings:  Uncomplicated HOLEP, excellent hemostasis Ureteral orifices and verumontanum intact at conclusion of case  EBL: Minimal  Specimens: Prostate chips  Enucleation time: 19 minutes  Morcellation time: 4 minutes  Intra-op weight: 25 g  Drains: 24 French three-way, 60 cc in balloon  Indication: Adrian Hart is a 72 y.o. patient with history of stable low risk prostate cancer on surveillance with low PSA of 2.5.  Developed worsening urinary symptoms culminating in urinary retention with multiple failed voiding trials, and opted for HOLEP.  After reviewing the management options for treatment, they elected to proceed with the above surgical procedure(s). We have discussed the potential benefits and risks of the procedure, side effects of the proposed treatment, the likelihood of the patient achieving the goals of the procedure, and any potential problems that might occur during the procedure or recuperation.  We specifically discussed the risks of bleeding, infection, hematuria and clot retention, need for additional procedures, possible overnight hospital stay, temporary urgency and incontinence, rare long-term incontinence, and retrograde ejaculation.  Informed consent has been obtained.   Description of procedure:  The patient was taken to the operating room and general anesthesia was induced.  The patient was placed in the dorsal lithotomy position, prepped and draped in the usual sterile fashion, and preoperative antibiotics(Cipro) were administered.  SCDs were placed for DVT prophylaxis.  A preoperative time-out was performed.   Adrian Hart sounds were used to gently dilated the urethra up to 49F. The 42 French  continuous flow resectoscope was inserted into the urethra using the visual obturator  The prostate was moderate in size with obstructing lateral lobes. The bladder was thoroughly inspected and notable for moderate trabeculations.  The ureteral orifices were located in orthotopic position.    The laser was set to 2 J and 60 Hz and early apical release was performed by making a circumferential mucosal incision proximal to the sphincter.  A lambda incision was then made proximal to the verumontanum.  The prostate was enucleated en bloc circumferentially into the bladder.  The capsule was examined and laser was used for meticulous hemostasis.    The 19 French resectoscope was then switched out for the 26 French nephroscope and prostate tissue was morcellated(Piranha) and the tissue sent to pathology.  A 24 French three-way catheter was inserted easily with the aid of a catheter guide, and 60 cc were placed in the balloon.  Urine was clear.  The catheter irrigated easily with a Toomey syringe.  CBI was initiated.   The patient tolerated the procedure well without any immediate complications and was extubated and transferred to the recovery room in stable condition.  Urine was clear on fast CBI.  Disposition: Stable to PACU  Plan: Wean CBI in PACU, anticipate discharge home today with Foley removal in clinic in 2-3 days  Legrand Rams, MD 03/08/2023

## 2023-03-08 NOTE — Transfer of Care (Signed)
Immediate Anesthesia Transfer of Care Note  Patient: Adrian Hart  Procedure(s) Performed: HOLEP-LASER ENUCLEATION OF THE PROSTATE WITH MORCELLATION (Prostate)  Patient Location: PACU  Anesthesia Type:General  Level of Consciousness: awake and drowsy  Airway & Oxygen Therapy: Patient Spontanous Breathing  Post-op Assessment: Report given to RN and Post -op Vital signs reviewed and stable  Post vital signs: Reviewed and stable  Last Vitals:  Vitals Value Taken Time  BP 160/94 03/08/23 1022  Temp 36.8 C 03/08/23 1022  Pulse 77 03/08/23 1024  Resp 17 03/08/23 1024  SpO2 96 % 03/08/23 1024  Vitals shown include unvalidated device data.  Last Pain:  Vitals:   03/08/23 1022  TempSrc:   PainSc: 0-No pain      Patients Stated Pain Goal: 1 (03/08/23 0804)  Complications: No notable events documented.

## 2023-03-11 ENCOUNTER — Ambulatory Visit (INDEPENDENT_AMBULATORY_CARE_PROVIDER_SITE_OTHER): Payer: HMO | Admitting: Physician Assistant

## 2023-03-11 VITALS — BP 155/90 | HR 80 | Ht 71.0 in | Wt 204.0 lb

## 2023-03-11 DIAGNOSIS — N401 Enlarged prostate with lower urinary tract symptoms: Secondary | ICD-10-CM

## 2023-03-11 DIAGNOSIS — B078 Other viral warts: Secondary | ICD-10-CM | POA: Diagnosis not present

## 2023-03-11 DIAGNOSIS — D2261 Melanocytic nevi of right upper limb, including shoulder: Secondary | ICD-10-CM | POA: Diagnosis not present

## 2023-03-11 DIAGNOSIS — D2262 Melanocytic nevi of left upper limb, including shoulder: Secondary | ICD-10-CM | POA: Diagnosis not present

## 2023-03-11 DIAGNOSIS — D2272 Melanocytic nevi of left lower limb, including hip: Secondary | ICD-10-CM | POA: Diagnosis not present

## 2023-03-11 DIAGNOSIS — D225 Melanocytic nevi of trunk: Secondary | ICD-10-CM | POA: Diagnosis not present

## 2023-03-11 DIAGNOSIS — N138 Other obstructive and reflux uropathy: Secondary | ICD-10-CM

## 2023-03-11 DIAGNOSIS — D2271 Melanocytic nevi of right lower limb, including hip: Secondary | ICD-10-CM | POA: Diagnosis not present

## 2023-03-11 DIAGNOSIS — Z85828 Personal history of other malignant neoplasm of skin: Secondary | ICD-10-CM | POA: Diagnosis not present

## 2023-03-11 LAB — SURGICAL PATHOLOGY

## 2023-03-11 NOTE — Progress Notes (Signed)
I counseled patient on normal postoperative findings including dysuria, gross hematuria, and urinary urgency/leakage. Counseled patient to begin Kegel exercises 3x10 sets daily to increase urinary control and wear absorbent products as needed for security. Written and verbal resources provided today. Surgical pathology pending; will defer to Dr. Richardo Hanks to share results when available.   Carman Ching, PA-C   Follow up: 2 months for prostate cancer follow-up with Dr. Lonna Cobb; 3 months for postop follow-up with Dr. Richardo Hanks

## 2023-03-11 NOTE — Patient Instructions (Signed)

## 2023-03-11 NOTE — Progress Notes (Signed)
Catheter Removal  Patient is present today for a catheter removal.  71ml of water was drained from the balloon. A 24FR foley cath was removed from the bladder, no complications were noted. Patient tolerated well.  Performed by: Randa [name], RMA

## 2023-03-18 ENCOUNTER — Ambulatory Visit: Payer: HMO

## 2023-03-18 DIAGNOSIS — Z86010 Personal history of colonic polyps: Secondary | ICD-10-CM | POA: Diagnosis not present

## 2023-03-18 DIAGNOSIS — D131 Benign neoplasm of stomach: Secondary | ICD-10-CM | POA: Diagnosis not present

## 2023-03-18 DIAGNOSIS — K5730 Diverticulosis of large intestine without perforation or abscess without bleeding: Secondary | ICD-10-CM | POA: Diagnosis not present

## 2023-03-18 DIAGNOSIS — K641 Second degree hemorrhoids: Secondary | ICD-10-CM | POA: Diagnosis not present

## 2023-03-18 DIAGNOSIS — K449 Diaphragmatic hernia without obstruction or gangrene: Secondary | ICD-10-CM | POA: Diagnosis not present

## 2023-03-18 DIAGNOSIS — K219 Gastro-esophageal reflux disease without esophagitis: Secondary | ICD-10-CM | POA: Diagnosis not present

## 2023-03-18 DIAGNOSIS — K222 Esophageal obstruction: Secondary | ICD-10-CM | POA: Diagnosis not present

## 2023-05-17 ENCOUNTER — Other Ambulatory Visit: Payer: Self-pay

## 2023-05-17 DIAGNOSIS — C61 Malignant neoplasm of prostate: Secondary | ICD-10-CM

## 2023-05-17 DIAGNOSIS — N138 Other obstructive and reflux uropathy: Secondary | ICD-10-CM

## 2023-05-21 ENCOUNTER — Other Ambulatory Visit: Payer: HMO

## 2023-05-21 DIAGNOSIS — N138 Other obstructive and reflux uropathy: Secondary | ICD-10-CM

## 2023-05-21 DIAGNOSIS — C61 Malignant neoplasm of prostate: Secondary | ICD-10-CM | POA: Diagnosis not present

## 2023-05-21 DIAGNOSIS — N401 Enlarged prostate with lower urinary tract symptoms: Secondary | ICD-10-CM | POA: Diagnosis not present

## 2023-05-22 LAB — PSA: Prostate Specific Ag, Serum: 2.6 ng/mL (ref 0.0–4.0)

## 2023-05-23 ENCOUNTER — Encounter: Payer: Self-pay | Admitting: Urology

## 2023-05-23 ENCOUNTER — Ambulatory Visit (INDEPENDENT_AMBULATORY_CARE_PROVIDER_SITE_OTHER): Payer: HMO | Admitting: Urology

## 2023-05-23 ENCOUNTER — Ambulatory Visit: Payer: HMO | Admitting: Urology

## 2023-05-23 VITALS — BP 130/78 | HR 81 | Ht 71.0 in | Wt 204.0 lb

## 2023-05-23 DIAGNOSIS — N138 Other obstructive and reflux uropathy: Secondary | ICD-10-CM

## 2023-05-23 DIAGNOSIS — R3915 Urgency of urination: Secondary | ICD-10-CM

## 2023-05-23 DIAGNOSIS — Z09 Encounter for follow-up examination after completed treatment for conditions other than malignant neoplasm: Secondary | ICD-10-CM

## 2023-05-23 DIAGNOSIS — N401 Enlarged prostate with lower urinary tract symptoms: Secondary | ICD-10-CM

## 2023-05-23 DIAGNOSIS — C61 Malignant neoplasm of prostate: Secondary | ICD-10-CM

## 2023-05-23 LAB — MICROSCOPIC EXAMINATION

## 2023-05-23 LAB — URINALYSIS, COMPLETE
Bilirubin, UA: NEGATIVE
Glucose, UA: NEGATIVE
Ketones, UA: NEGATIVE
Leukocytes,UA: NEGATIVE
Nitrite, UA: NEGATIVE
Protein,UA: NEGATIVE
Specific Gravity, UA: 1.015 (ref 1.005–1.030)
Urobilinogen, Ur: 0.2 mg/dL (ref 0.2–1.0)
pH, UA: 7 (ref 5.0–7.5)

## 2023-05-23 NOTE — Progress Notes (Signed)
Adrian Hart,acting as a Neurosurgeon for Adrian Altes, Adrian Hart.,have documented all relevant documentation on the behalf of Adrian Altes, Adrian Hart,as directed by  Adrian Altes, Adrian Hart while in the presence of Adrian Altes, Adrian Hart.  05/23/2023 1:14 PM   Adrian Hart Jul 06, 1951 [id number]  Referring provider: Lauro Regulus, Adrian Hart [address] [facility] [place],  [name] [address]  Chief Complaint  Patient presents with   Follow-up    Urologic history: 1. cT1c adenocarcinoma prostate low risk - Biopsy 08/2018; abnormal DRE; PSA 2.7; 66 g prostate - 4/12 cores positive Gleason 3+3 (LLB, LLM, LM, LA) - 1-25% involvement - Elected surveillance - Prostate MRI (12/18/18) w/2 PI-RADS 4 lesions left base and left mid gland concerning for high-grade carcinoma, no adenopathy, capsule intact, normal seminal vesicles - Fusion Biopsy 01/2019: PI-RADS 4 lesions were benign, left side of biopsy positive Gleason 3+3  2.  BPH with LUTS  3.  Erectile dysfunction - Tadalafil 20 mg prn   HPI: 72 y.o. male presents for follow-up.  Since his last visit, he developed urinary retention in 12/2022 and failed voiding trials x3.  Subsequently underwent HoLEP by Dr. Richardo Hanks in 02/2023 PSA on 05/21/2023 was 2.6 He states that his incontinence post HoLEP resolved approximately 2 weeks ago. However, over the last 2 days, he has had increased urinary frequency and urgency with occasional episodes of urge incontinence. No dysuria or gross hematuria   PMH: Past Medical History:  Diagnosis Date   Anxiety    Arthritis    COPD (chronic obstructive pulmonary disease) (HCC)    Coronary artery disease    GERD (gastroesophageal reflux disease)    PT TAKES APPLE CIDER VINEGAR    Hypertension    borderline pressures.  takes no meds   PONV (postoperative nausea and vomiting)    PONV x1 several years ago...also - urinarty retention after knee surgery (12/25/22)   Prostate cancer (HCC)      needs no treatment. still on observation   Thoracic aortic atherosclerosis [facility])     Surgical History: Past Surgical History:  Procedure Laterality Date   CATARACT EXTRACTION W/PHACO Left 01/07/2023   Procedure: CATARACT EXTRACTION PHACO AND INTRAOCULAR LENS PLACEMENT (IOC) LEFT 2.79 00:27.1;  Surgeon: Nevada Crane, Adrian Hart;  Location: [facility];  Service: Ophthalmology;  Laterality: Left;   COLONOSCOPY  2003   Dr Thurmond Butts   COLONOSCOPY WITH PROPOFOL N/A 05/02/2018   Procedure: COLONOSCOPY WITH PROPOFOL;  Surgeon: Scot Jun, Adrian Hart;  Location: [facility] END[facility]COPY;  Service: Endoscopy;  Laterality: N/A;   EYE SURGERY Right    cataract   FLEXIBLE SIGMOIDOSCOPY  1996   Dr Lemar Livings   HEMORRHOID SURGERY  04-20-04   Dr Lemar Livings   HERNIA REPAIR  09/27/2015   Umbilical, primary repair   HOLEP-LASER ENUCLEATION OF THE PROSTATE WITH MORCELLATION N/A 03/08/2023   Procedure: HOLEP-LASER ENUCLEATION OF THE PROSTATE WITH MORCELLATION;  Surgeon: Sondra Come, Adrian Hart;  Location: [facility] ORS;  Service: Urology;  Laterality: N/A;   KNEE ARTHROSCOPY Right 12/20/2022   Procedure: RIGHT KNEE ARTHROSCOPY WITH DEBRIDEMENT, PARTIAL LATERAL MENISCECTOMY, AND EXCISION OF LOOSE BODY RIGHT KNEE.;  Surgeon: Christena Flake, Adrian Hart;  Location: [facility] OR[facility];  Service: Orthopedics;  Laterality: Right;   KNEE ARTHROSCOPY Right 12/25/2022   Procedure: RIGHT KNEE ARTHROSCOPY WITH IRRIGATION AND DEBRIDEMENT.;  Surgeon: Christena Flake, Adrian Hart;  Location: [facility];  Service: Orthopedics;  Laterality: Right;   KNEE SURGERY Left 1995  arthroscopy   PARTIAL KNEE ARTHROPLASTY Right 09/05/2017   Procedure: UNICOMPARTMENTAL KNEE;  Surgeon: Christena Flake, Adrian Hart;  Location: [facility];  Service: Orthopedics;  Laterality: Right;   PARTIAL KNEE ARTHROPLASTY Left 09/01/2019   Procedure: UNICOMPARTMENTAL KNEE;  Surgeon: Christena Flake, Adrian Hart;  Location: [facility];  Service: Orthopedics;  Laterality: Left;   TONSILLECTOMY     UMBILICAL HERNIA REPAIR N/A 09/27/2015    Procedure: HERNIA REPAIR UMBILICAL ADULT;  Surgeon: Earline Mayotte, Adrian Hart;  Location: [facility] OR[facility];  Service: General;  Laterality: N/A;   XI ROBOTIC ASSISTED INGUINAL HERNIA REPAIR WITH MESH Left 07/28/2020   Procedure: XI ROBOTIC ASSISTED INGUINAL HERNIA REPAIR WITH MESH;  Surgeon: Leafy Ro, Adrian Hart;  Location: [facility] OR[facility];  Service: General;  Laterality: Left;    Home Medications:  Allergies as of 05/23/2023       Reactions   Fish-derived Products Nausea And Vomiting   Reaction to mussells only        Medication List        Accurate as of May 23, 2023  1:14 PM. If you have any questions, ask your nurse or doctor.          bisacodyl 5 MG EC tablet Commonly known as: DULCOLAX Take 1 tablet (5 mg total) by mouth daily as needed for moderate constipation.   escitalopram 20 MG tablet Commonly known as: LEXAPRO Take 20 mg by mouth daily.   losartan-hydrochlorothiazide 100-12.5 MG tablet Commonly known as: HYZAAR Take 1 tablet by mouth daily.   MACA PO Take 1 capsule by mouth daily.   Multi-Vitamins Tabs Take 1 tablet by mouth daily.   omeprazole 40 MG capsule Commonly known as: PRILOSEC Take 40 mg by mouth daily.   phenazopyridine 200 MG tablet Commonly known as: Pyridium Take 1 tablet (200 mg total) by mouth 3 (three) times daily as needed for pain.   PSYLLIUM PO Take 3 capsules by mouth 2 (two) times daily.   rosuvastatin 10 MG tablet Commonly known as: CRESTOR Take 10 mg by mouth daily.   tadalafil 20 MG tablet Commonly known as: CIALIS Take 1 tablet (20 mg total) by mouth daily as needed for erectile dysfunction.   tadalafil 5 MG tablet Commonly known as: CIALIS Take 1 tablet (5 mg total) by mouth daily as needed for erectile dysfunction.   traZODone 50 MG tablet Commonly known as: DESYREL Take 25 mg by mouth at bedtime as needed for sleep.   ZINC PO Take 1 tablet by mouth daily.        Allergies:  Allergies  Allergen Reactions    Fish-Derived Products Nausea And Vomiting    Reaction to mussells only    Family History: Family History  Problem Relation Age of Onset   Diabetes Mother    Colon cancer Maternal Aunt    Prostate cancer Maternal Uncle    Bladder Cancer Neg Hx    Kidney cancer Neg Hx     Social History:  reports that he quit smoking about 15 years ago. His smoking use included cigarettes. He has a 30.00 pack-year smoking history. He has been exposed to tobacco smoke. He has never used smokeless tobacco. He reports current drug use. Drug: Marijuana. He reports that he does not drink alcohol.   Physical Exam: BP 130/78   Pulse 81   Ht 5\' 11"  (1.803 m)   Wt 204 lb (92.5 kg)   BMI 28.45 kg/m   Constitutional:  Alert and oriented, No acute distress. HEENT:  [place] AT Respiratory: Normal respiratory effort, no increased work of breathing. Neurologic: Grossly intact, no focal deficits, moving all 4 extremities. Psychiatric: Normal mood and affect.   Assessment & Plan:    1.  T1c low risk prostate cancer Stable PSA Lab visit in 6 months with PSA 1 follow up with PSA  2.  BPH Status post HoLEP UA today pending; will call with results PVR today was 0 mL Keep follow up with Dr. Richardo Hanks next month for symptom reassessment   I have reviewed the above documentation for accuracy and completeness, and I agree with the above.   Adrian Altes, Adrian Hart  [facility] [address] [place], [name] [address] [phone]

## 2023-06-05 ENCOUNTER — Ambulatory Visit: Payer: HMO | Admitting: Urology

## 2023-06-05 ENCOUNTER — Ambulatory Visit (INDEPENDENT_AMBULATORY_CARE_PROVIDER_SITE_OTHER): Payer: HMO | Admitting: Urology

## 2023-06-05 DIAGNOSIS — N138 Other obstructive and reflux uropathy: Secondary | ICD-10-CM

## 2023-06-05 DIAGNOSIS — N401 Enlarged prostate with lower urinary tract symptoms: Secondary | ICD-10-CM

## 2023-06-05 DIAGNOSIS — C61 Malignant neoplasm of prostate: Secondary | ICD-10-CM

## 2023-06-05 LAB — BLADDER SCAN AMB NON-IMAGING

## 2023-06-05 NOTE — Progress Notes (Signed)
   06/05/2023 10:11 AM   Adrian Hart 11-01-1951 [id number]  Reason for visit: Follow up BPH status post HOLEP, low risk prostate cancer  HPI: 72 year old male who previously was followed by Dr. Lonna Cobb for stable low risk prostate cancer on surveillance, also with BPH who failed numerous voiding trials in the spring 2024 and ultimately underwent HOLEP on 03/08/2023. 25g of benign tissue were removed, and he has been doing well since surgery.  He is urinating with a strong stream, denies any incontinence, PVR normal today at 50ml.  He does still have some occasional urgency a few times a week, we reviewed avoiding bladder irritants and timed voiding.  Recent urinalysis 2 weeks ago was benign.  Overall, significant improvement in his urinary symptoms compared to before surgery.  Recent PSA from 05/21/2023 was 2.6 from 2.5 in June 2023.  This is surprising, as I would expect his PSA to be lower after HOLEP, agree with Dr. Heywood Footman recommendations to continue to monitor the PSA, and he is scheduled for next PSA in December 2024.  Continue active surveillance for low risk prostate cancer with Dr. Lonna Cobb, next PSA in December 2024 Urinary symptoms essentially resolved after HOLEP, could consider OAB medication if persistent symptoms at follow-up but they continue to improve   Sondra Come, MD  [facility] [address], [address] [place], [name] [address] [phone]

## 2023-07-01 DIAGNOSIS — J449 Chronic obstructive pulmonary disease, unspecified: Secondary | ICD-10-CM | POA: Diagnosis not present

## 2023-07-01 DIAGNOSIS — Z6828 Body mass index (BMI) 28.0-28.9, adult: Secondary | ICD-10-CM | POA: Diagnosis not present

## 2023-07-01 DIAGNOSIS — I10 Essential (primary) hypertension: Secondary | ICD-10-CM | POA: Diagnosis not present

## 2023-07-24 DIAGNOSIS — I2510 Atherosclerotic heart disease of native coronary artery without angina pectoris: Secondary | ICD-10-CM | POA: Diagnosis not present

## 2023-07-24 DIAGNOSIS — R7303 Prediabetes: Secondary | ICD-10-CM | POA: Diagnosis not present

## 2023-07-24 DIAGNOSIS — I700 Atherosclerosis of aorta: Secondary | ICD-10-CM | POA: Diagnosis not present

## 2023-07-31 DIAGNOSIS — I700 Atherosclerosis of aorta: Secondary | ICD-10-CM | POA: Diagnosis not present

## 2023-07-31 DIAGNOSIS — R7303 Prediabetes: Secondary | ICD-10-CM | POA: Diagnosis not present

## 2023-07-31 DIAGNOSIS — I2510 Atherosclerotic heart disease of native coronary artery without angina pectoris: Secondary | ICD-10-CM | POA: Diagnosis not present

## 2023-07-31 DIAGNOSIS — Z0000 Encounter for general adult medical examination without abnormal findings: Secondary | ICD-10-CM | POA: Diagnosis not present

## 2023-07-31 DIAGNOSIS — F411 Generalized anxiety disorder: Secondary | ICD-10-CM | POA: Diagnosis not present

## 2023-08-06 ENCOUNTER — Other Ambulatory Visit: Payer: Self-pay | Admitting: *Deleted

## 2023-08-06 DIAGNOSIS — R339 Retention of urine, unspecified: Secondary | ICD-10-CM

## 2023-08-06 MED ORDER — TADALAFIL 5 MG PO TABS: 5.0000 mg | ORAL_TABLET | Freq: Every day | ORAL | 3 refills | Status: DC | PRN

## 2023-08-21 DIAGNOSIS — H268 Other specified cataract: Secondary | ICD-10-CM | POA: Diagnosis not present

## 2023-08-21 DIAGNOSIS — H43813 Vitreous degeneration, bilateral: Secondary | ICD-10-CM | POA: Diagnosis not present

## 2023-08-21 DIAGNOSIS — Z961 Presence of intraocular lens: Secondary | ICD-10-CM | POA: Diagnosis not present

## 2023-08-23 DIAGNOSIS — J4489 Other specified chronic obstructive pulmonary disease: Secondary | ICD-10-CM | POA: Diagnosis not present

## 2023-08-23 DIAGNOSIS — R0602 Shortness of breath: Secondary | ICD-10-CM | POA: Diagnosis not present

## 2023-10-02 DIAGNOSIS — J069 Acute upper respiratory infection, unspecified: Secondary | ICD-10-CM | POA: Diagnosis not present

## 2023-10-08 ENCOUNTER — Ambulatory Visit: Payer: HMO | Admitting: Psychology

## 2023-10-22 ENCOUNTER — Ambulatory Visit: Payer: HMO | Admitting: Psychology

## 2023-11-20 ENCOUNTER — Other Ambulatory Visit: Payer: Self-pay | Admitting: Medical Genetics

## 2023-11-21 ENCOUNTER — Other Ambulatory Visit: Payer: HMO

## 2023-11-21 ENCOUNTER — Other Ambulatory Visit
Admission: RE | Admit: 2023-11-21 | Discharge: 2023-11-21 | Disposition: A | Discharge status: Home / Self Care | Payer: Self-pay | Source: Ambulatory Visit | Attending: Medical Genetics | Admitting: Medical Genetics

## 2023-11-21 ENCOUNTER — Other Ambulatory Visit: Payer: Self-pay

## 2023-11-21 DIAGNOSIS — N138 Other obstructive and reflux uropathy: Secondary | ICD-10-CM | POA: Diagnosis not present

## 2023-11-21 DIAGNOSIS — N401 Enlarged prostate with lower urinary tract symptoms: Secondary | ICD-10-CM

## 2023-11-22 LAB — PSA: Prostate Specific Ag, Serum: 2.2 ng/mL (ref 0.0–4.0)

## 2023-12-02 LAB — GENECONNECT MOLECULAR SCREEN: Genetic Analysis Overall Interpretation: NEGATIVE

## 2024-01-27 DIAGNOSIS — I2510 Atherosclerotic heart disease of native coronary artery without angina pectoris: Secondary | ICD-10-CM | POA: Diagnosis not present

## 2024-01-27 DIAGNOSIS — R7303 Prediabetes: Secondary | ICD-10-CM | POA: Diagnosis not present

## 2024-01-28 ENCOUNTER — Other Ambulatory Visit: Payer: Self-pay | Admitting: Internal Medicine

## 2024-01-28 DIAGNOSIS — Z720 Tobacco use: Secondary | ICD-10-CM

## 2024-02-03 DIAGNOSIS — R7303 Prediabetes: Secondary | ICD-10-CM | POA: Diagnosis not present

## 2024-02-03 DIAGNOSIS — F411 Generalized anxiety disorder: Secondary | ICD-10-CM | POA: Diagnosis not present

## 2024-02-03 DIAGNOSIS — I2510 Atherosclerotic heart disease of native coronary artery without angina pectoris: Secondary | ICD-10-CM | POA: Diagnosis not present

## 2024-02-03 DIAGNOSIS — I700 Atherosclerosis of aorta: Secondary | ICD-10-CM | POA: Diagnosis not present

## 2024-02-03 NOTE — Progress Notes (Signed)
 Adrian Hart is a 73 y.o. male here for follow up of their medical problems  CHIEF COMPLAINT:  Follow up medical problems in the problem list and as discussed in the history and assessment areas as well as new complaints as listed.   Patient Active Problem List  Diagnosis  . Umbilical hernia  . Health care maintenance  . Prediabetes  . Status post right partial knee replacement  . Coronary artery disease involving native coronary artery  . Status post left partial knee replacement  . Raynaud's disease without gangrene  . Thoracic aortic atherosclerosis (CMS-HCC)  . GAD (generalized anxiety disorder)  . GERD (gastroesophageal reflux disease)  . Tubulovillous adenoma of colon     HISTORY OF PRESENT ILLNESS:  Prediabetes Glucose is followed and controlled on diet and exercise   Thoracic aortic atherosclerosis (CMS-HCC) No cp or other issues on rosuvastatin   Coronary artery disease involving native coronary artery Exertional chest pain is not present and patient is tolerating the prescribed medical regimen.    GAD (generalized anxiety disorder) Has been stable on lexapro.   Past Medical History:  Diagnosis Date  . Cataract cortical, senile    Right eye October 2017  . COPD (chronic obstructive pulmonary disease) (CMS/HHS-HCC)   . Coronary artery disease involving native coronary artery 09/04/2018  . GAD (generalized anxiety disorder) 03/30/2022  . GERD (gastroesophageal reflux disease)   . PONV (postoperative nausea and vomiting)    PONV, hangover for 3 days  . Post-traumatic osteoarthritis of left knee 12/10/2016  . Prostate cancer (CMS/HHS-HCC) 09/2018    Past Surgical History:  Procedure Laterality Date  . Left knee arthroscopy  1995  . COLONOSCOPY  07/29/2002   Int Hemorrhoids, Diverticulosis: CBF 07/2012; Sch'ed 09/2017 then postponed d/t knee replacement per RTE; CBF 03/2018; Recall Ltr mailed 01/31/2018 (dh)  . HERNIA REPAIR  2016  . EXTRACTION CATARACT  EXTRACAPSULAR W/INSERTION INTRAOCULAR PROSTHESIS Right 07/13/2015   Procedure: -R- LRI SUNG  EXTRACTION CATARACT EXTRACAPSULAR WITH PHACO WITH INSERTION INTRAOCULAR PROSTHESIS;  Surgeon: Therisa Phebe Senior, MD;  Location: [facility] OR;  Service: Ophthalmology;  Laterality: Right;  LRI/comx [place] NO LENSX PER MICHELLE / 8/3 LR   . CATARACT EXTRACTION  09/2016  . Right unicondylar knee arthroplasty. Right 09/05/2017   Dr. Edie  . COLONOSCOPY  05/02/2018   Adenomatous Polyps: CBF 04/2021  . Left unicondylar knee arthroplasty. Left 09/01/2019   Dr. Edie  . Extensive arthroscopic synovectomy with partial lateral meniscectomy and excision of loose body, right knee. Right 12/20/2022   Dr.Poggi  . Arthroscopic irrigation debridement of right knee with primary repair of right knee wounds Right 12/25/2022   Dr.Poggi  . Colon @ [facility]  03/18/2023   Normal examined colon/PHx CP/No repeat/TKT  . EGD @ [facility]  03/18/2023   Normal EGD biopsy/No Repeat/TKT  . FLEXIBLE SIGMOIDOSCOPY  1996 ?   Dr. DOROTHA Cota @ [facility]  . KNEE ARTHROSCOPY    . TONSILLECTOMY     as a child     No fever chills or sweats   No nausea, vomiting or diarrhea  No chest pain, shortness of breath   Social History   Socioeconomic History  . Marital status: Widowed  Tobacco Use  . Smoking status: Former    Current packs/day: 0.50    Average packs/day: 0.5 packs/day for 30.0 years (15.0 ttl pk-yrs)    Types: Cigarettes, Pipe, Cigars  . Smokeless tobacco: Never  . Tobacco comments:    Quit cigarettes 10 years ago, currently smokes  cigars.        States smoking occasionally, pipe tobacco. 08/23/2023 -IC   Vaping Use  . Vaping status: Never Used  Substance and Sexual Activity  . Alcohol use: Not Currently  . Drug use: No  . Sexual activity: Not Currently    Partners: Female    Birth control/protection: None   Social Drivers of Health   Financial Resource Strain: Low Risk  (08/20/2023)   Overall Financial Resource Strain  (CARDIA)   . Difficulty of Paying Living Expenses: Not hard at all  Food Insecurity: No Food Insecurity (08/20/2023)   Hunger Vital Sign   . Worried About Programme researcher, broadcasting/film/video in the Last Year: Never true   . Ran Out of Food in the Last Year: Never true  Transportation Needs: No Transportation Needs (08/20/2023)   PRAPARE - Transportation   . Lack of Transportation (Medical): No   . Lack of Transportation (Non-Medical): No  Housing Stability: Low Risk  (01/23/2024)   Housing Stability Vital Sign   . Unable to Pay for Housing in the Last Year: No   . Number of Times Moved in the Last Year: 0   . Homeless in the Last Year: No      Current Outpatient Medications:  .  bisacodyL  (DULCOLAX) 5 mg EC tablet, Take 5 mg by mouth once daily as needed for Constipation, Disp: , Rfl:  .  escitalopram oxalate (LEXAPRO) 10 MG tablet, Take 1 tablet (10 mg total) by mouth once daily for 360 days, Disp: 90 tablet, Rfl: 3 .  ibuprofen (MOTRIN) 200 MG tablet, Take 200 mg by mouth as needed for Pain, Disp: , Rfl:  .  losartan-hydroCHLOROthiazide (HYZAAR) 100-12.5 mg tablet, TAKE 1 TABLET BY MOUTH EVERY DAY, Disp: 90 tablet, Rfl: 3 .  omeprazole (PRILOSEC) 40 MG DR capsule, TAKE 1 CAPSULE BY MOUTH EVERY DAY, Disp: 90 capsule, Rfl: 3 .  psyllium husk/aspartame (NATURAL PSYLLIUM FIBER ORAL), Take 1 tablet by mouth once daily.  , Disp: , Rfl:  .  rosuvastatin (CRESTOR) 10 MG tablet, TAKE 1 TABLET BY MOUTH EVERY DAY, Disp: 90 tablet, Rfl: 3 .  tadalafiL  (CIALIS ) 5 MG tablet, Take 5 mg by mouth once daily as needed for Erectile Dysfunction, Disp: , Rfl:  .  traZODone  (DESYREL ) 50 MG tablet, TAKE 1 TABLET BY MOUTH EVERY DAY AT BEDTIME AS NEEDED FOR SLEEP, Disp: 90 tablet, Rfl: 1  Vitals:   02/03/24 1037  BP: 133/78  Pulse: 72  Resp: 16   Body mass index is 29.7 kg/m. No acute distress Lungs; clear to ascultation Heart; Regular rate and rhythm  Abdomen; Soft and flat, normal bowel sounds Extremities; No  clubbing, cyanosis or edema  Appointment on 01/27/2024  Component Date Value Ref Range Status  . Glucose 01/27/2024 114 (H)  70 - 110 mg/dL Final  . Sodium 97/75/7974 141  136 - 145 mmol/L Final  . Potassium 01/27/2024 3.9  3.6 - 5.1 mmol/L Final  . Chloride 01/27/2024 101  97 - 109 mmol/L Final  . Carbon Dioxide (CO2) 01/27/2024 31.3  22.0 - 32.0 mmol/L Final  . Urea Nitrogen (BUN) 01/27/2024 24  7 - 25 mg/dL Final  . Creatinine 97/75/7974 0.9  0.7 - 1.3 mg/dL Final  . Glomerular Filtration Rate (eGFR) 01/27/2024 91  >60 mL/min/1.73sq m Final  . Calcium 01/27/2024 9.7  8.7 - 10.3 mg/dL Final  . AST  97/75/7974 14  8 - 39 U/L Final  . ALT  01/27/2024 19  6 -  57 U/L Final  . Alk Phos (alkaline Phosphatase) 01/27/2024 68  34 - 104 U/L Final  . Albumin 01/27/2024 4.4  3.5 - 4.8 g/dL Final  . Bilirubin, Total 01/27/2024 0.7  0.3 - 1.2 mg/dL Final  . Protein, Total 01/27/2024 6.4  6.1 - 7.9 g/dL Final  . A/G Ratio 97/75/7974 2.2  1.0 - 5.0 gm/dL Final  . Hemoglobin J8R 01/27/2024 6.3 (H)  4.2 - 5.6 % Final  . Average Blood Glucose (Calc) 01/27/2024 134  mg/dL Final  . Cholesterol, Total 01/27/2024 114  100 - 200 mg/dL Final  . Triglyceride 97/75/7974 85  35 - 199 mg/dL Final  . HDL (High Density Lipoprotein) Cho* 01/27/2024 41.1  29.0 - 71.0 mg/dL Final  . LDL Calculated 01/27/2024 56  0 - 130 mg/dL Final  . VLDL Cholesterol 01/27/2024 17  mg/dL Final  . Cholesterol/HDL Ratio 01/27/2024 2.8   Final    ASSESSMENT  AND PLAN:  Diagnoses and all orders for this visit:  Prediabetes Assessment & Plan: Glucose is followed and controlled on diet and exercise   Orders: -     Comprehensive Metabolic Panel (CMP); Future -     CBC w/auto Differential (5 Part); Future -     Hemoglobin A1C; Future -     Lipid Panel w/calc LDL; Future  Coronary artery disease involving native coronary artery of native heart without angina pectoris Assessment & Plan: Exertional chest pain is not present and  patient is tolerating the prescribed medical regimen.    Orders: -     Comprehensive Metabolic Panel (CMP); Future -     CBC w/auto Differential (5 Part); Future -     Hemoglobin A1C; Future -     Lipid Panel w/calc LDL; Future  GAD (generalized anxiety disorder) Assessment & Plan: Has been stable on lexapro.    Thoracic aortic atherosclerosis (CMS-HCC) Assessment & Plan: No cp or other issues on rosuvastatin

## 2024-02-10 ENCOUNTER — Ambulatory Visit
Admission: RE | Admit: 2024-02-10 | Discharge: 2024-02-10 | Disposition: A | Discharge status: Home / Self Care | Payer: HMO | Source: Ambulatory Visit | Attending: Internal Medicine | Admitting: Internal Medicine

## 2024-02-10 DIAGNOSIS — I700 Atherosclerosis of aorta: Secondary | ICD-10-CM | POA: Insufficient documentation

## 2024-02-10 DIAGNOSIS — Z87891 Personal history of nicotine dependence: Secondary | ICD-10-CM | POA: Diagnosis not present

## 2024-02-10 DIAGNOSIS — J439 Emphysema, unspecified: Secondary | ICD-10-CM | POA: Insufficient documentation

## 2024-02-10 DIAGNOSIS — I2510 Atherosclerotic heart disease of native coronary artery without angina pectoris: Secondary | ICD-10-CM | POA: Diagnosis not present

## 2024-02-10 DIAGNOSIS — E278 Other specified disorders of adrenal gland: Secondary | ICD-10-CM | POA: Diagnosis not present

## 2024-02-10 DIAGNOSIS — I77810 Thoracic aortic ectasia: Secondary | ICD-10-CM | POA: Insufficient documentation

## 2024-02-10 DIAGNOSIS — Z720 Tobacco use: Secondary | ICD-10-CM | POA: Diagnosis present

## 2024-02-10 DIAGNOSIS — Z122 Encounter for screening for malignant neoplasm of respiratory organs: Secondary | ICD-10-CM | POA: Insufficient documentation

## 2024-02-10 DIAGNOSIS — F17210 Nicotine dependence, cigarettes, uncomplicated: Secondary | ICD-10-CM | POA: Diagnosis not present

## 2024-02-10 DIAGNOSIS — K449 Diaphragmatic hernia without obstruction or gangrene: Secondary | ICD-10-CM | POA: Diagnosis not present

## 2024-03-10 DIAGNOSIS — L820 Inflamed seborrheic keratosis: Secondary | ICD-10-CM | POA: Diagnosis not present

## 2024-03-10 DIAGNOSIS — Z85828 Personal history of other malignant neoplasm of skin: Secondary | ICD-10-CM | POA: Diagnosis not present

## 2024-03-10 DIAGNOSIS — L570 Actinic keratosis: Secondary | ICD-10-CM | POA: Diagnosis not present

## 2024-03-10 DIAGNOSIS — L538 Other specified erythematous conditions: Secondary | ICD-10-CM | POA: Diagnosis not present

## 2024-03-10 DIAGNOSIS — D485 Neoplasm of uncertain behavior of skin: Secondary | ICD-10-CM | POA: Diagnosis not present

## 2024-03-10 DIAGNOSIS — R58 Hemorrhage, not elsewhere classified: Secondary | ICD-10-CM | POA: Diagnosis not present

## 2024-03-10 DIAGNOSIS — D2271 Melanocytic nevi of right lower limb, including hip: Secondary | ICD-10-CM | POA: Diagnosis not present

## 2024-03-10 DIAGNOSIS — D2262 Melanocytic nevi of left upper limb, including shoulder: Secondary | ICD-10-CM | POA: Diagnosis not present

## 2024-03-10 DIAGNOSIS — L720 Epidermal cyst: Secondary | ICD-10-CM | POA: Diagnosis not present

## 2024-03-10 DIAGNOSIS — D2272 Melanocytic nevi of left lower limb, including hip: Secondary | ICD-10-CM | POA: Diagnosis not present

## 2024-03-10 DIAGNOSIS — D2261 Melanocytic nevi of right upper limb, including shoulder: Secondary | ICD-10-CM | POA: Diagnosis not present

## 2024-03-10 DIAGNOSIS — L309 Dermatitis, unspecified: Secondary | ICD-10-CM | POA: Diagnosis not present

## 2024-05-19 ENCOUNTER — Other Ambulatory Visit: Payer: Self-pay

## 2024-05-19 DIAGNOSIS — C61 Malignant neoplasm of prostate: Secondary | ICD-10-CM

## 2024-05-22 ENCOUNTER — Other Ambulatory Visit: Payer: Self-pay

## 2024-05-22 DIAGNOSIS — C61 Malignant neoplasm of prostate: Secondary | ICD-10-CM

## 2024-05-23 LAB — PSA: Prostate Specific Ag, Serum: 2.6 ng/mL (ref 0.0–4.0)

## 2024-05-27 ENCOUNTER — Ambulatory Visit: Payer: Self-pay | Admitting: Urology

## 2024-05-31 NOTE — Progress Notes (Unsigned)
 Psychiatric Initial Adult Assessment   Patient Identification: Adrian Hart MRN:  [id number] Date of Evaluation:  06/04/2024 Referral Source: Lenon Layman ORN, MD  Chief Complaint:   Chief Complaint  Patient presents with   Establish Care   Visit Diagnosis:    ICD-10-CM   1. Panic disorder  F41.0 TSH    Ambulatory referral to Psychology    2. GAD (generalized anxiety disorder)  F41.1 TSH    Ambulatory referral to Psychology      History of Present Illness:   Adrian Hart is a 73 y.o. year old male with a history of anxiety, CAD, GERD, T1c low risk prostate cancer, BPH s/p HoLEP, who is referred for anxiety.   He states that he has been struggling with anxiety, and panic attacks, which has been worsening in the past 6 months.  This occurs with diaphoresis, heavy breathing, nausea.  Although he wants to sleep, he cannot sleep due to anxiety.  He states that his wife died 6 years ago.  Although he thought he handled it, he may not.  She died from glioblastoma.  He states that he did not have time to process as it was quick. He misses her, stating that they have been together since around age 2.  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  Although he loved his work initially, there was issues with bureaucracy. He was on-call all the time, and was responsible.  He still has some reaction when he received his phone call.  He reports good relationship with his children, and grandchildren.  He went to the beach.  Although he was concerned about his anxiety, it turned out good. He also reports feeling anxious when he is by himself.  He did try meeting with another woman.  He tends to think about the worst case scenario, although he has been able to put himself out from this.  He feels that his body is vibrating, while he works on deep breathing. He denies irritability.    Depression-He sleeps up to 8 hours. He does not think he feels depressed.  However, he feels guilty that he is not able to be 100% grandfather for his grandchildren, due to his physical issues. He reports decrease in appetite, and reports nausea, thinking about the food.  Although he used to enjoy painting, outdoors since retirement, he has not done these as much.  He does not want to be around with people.  He sometimes does not care, and he feels tired of feeling other people's problems, referring to the AA meeting. He denies SI, HI, hallucinations.   PTSD-he states that his mother was demanding and critical.  He hurt her stating that there must be something wrong with him.  He always felt not good enough.   Physical-he states that he was very active, doing well until a few years ago.  He had surgery for knee, prostate and cataract.  He went through off the track after the procedure for urinary retention.  He also reports significant fatigue since COVID.  He was reportedly feeling very good, doing exercise, cardio,  and lifting.   Medication- Lexapro 10 mg daily (started to take consistently in the past month with good benefit), Trazodone  50 mg at night  Wt Readings from Last 3 Encounters:  06/04/24 188 lb 3.2 oz (85.4 kg)  05/23/23 204 lb (92.5 kg)  03/11/23 204  lb (92.5 kg)     Substance use  Tobacco Alcohol Other substances/  Current  Abstinence for 38 years, denies craving Marijuana for pain, since 2022/s/p knee surgery, it helps for anxiety, attention. He does not think it is a problem to use marijuana  Past  12 beers a day for insomnia despite him dislike drinking f Marijuana through college, LSD  Past Treatment      Support: children Household:  by himself Marital status: widower Number of children: 2 daughters Employment:  retired at age 20 (used to work as a Interior and spatial designer for mental health institution in [place] , [name]) Education:   He moves to [name] in 1990's for his work  Associated  Signs/Symptoms: Depression Symptoms:  insomnia, fatigue, anxiety, (Hypo) Manic Symptoms:  denies decreased need for sleep, euphoria Anxiety Symptoms:  Excessive Worry, Panic Symptoms, Psychotic Symptoms:  denies AH, VH, paranoia PTSD Symptoms: Had a traumatic exposure:  as above Re-experiencing:  None Hypervigilance:  No Hyperarousal:  None Avoidance:  Decreased Interest/Participation  Past Psychiatric History:  Outpatient:  Psychiatry admission: denies  Previous suicide attempt: denies  Past trials of medication: lexapro History of violence:  History of head injury:   Previous Psychotropic Medications: Yes   Substance Abuse History in the last 12 months:  Yes.    Consequences of Substance Abuse: Mood symptoms as outlined above  Past Medical History:  Past Medical History:  Diagnosis Date   Anxiety    Arthritis    COPD (chronic obstructive pulmonary disease) (HCC)    Coronary artery disease    GERD (gastroesophageal reflux disease)    PT TAKES APPLE CIDER VINEGAR    Hypertension    borderline pressures.  takes no meds   PONV (postoperative nausea and vomiting)    PONV x1 several years ago...also - urinarty retention after knee surgery (12/25/22)   Prostate cancer (HCC)     needs no treatment. still on observation   Thoracic aortic atherosclerosis [facility])     Past Surgical History:  Procedure Laterality Date   CATARACT EXTRACTION W/PHACO Left 01/07/2023   Procedure: CATARACT EXTRACTION PHACO AND INTRAOCULAR LENS PLACEMENT (IOC) LEFT 2.79 00:27.1;  Surgeon: Myrna Adine Anes, MD;  Location: [facility];  Service: Ophthalmology;  Laterality: Left;   COLONOSCOPY  2003   Dr Desiderio   COLONOSCOPY WITH PROPOFOL  N/A 05/02/2018   Procedure: COLONOSCOPY WITH PROPOFOL ;  Surgeon: Viktoria Lamar DASEN, MD;  Location: [facility];  Service: Endoscopy;  Laterality: N/A;   EYE SURGERY Right    cataract   FLEXIBLE SIGMOIDOSCOPY  1996   Dr Dessa   HEMORRHOID SURGERY   04-20-04   Dr Dessa   HERNIA REPAIR  09/27/2015   Umbilical, primary repair   HOLEP-LASER ENUCLEATION OF THE PROSTATE WITH MORCELLATION N/A 03/08/2023   Procedure: HOLEP-LASER ENUCLEATION OF THE PROSTATE WITH MORCELLATION;  Surgeon: Francisca Redell BROCKS, MD;  Location: [facility];  Service: Urology;  Laterality: N/A;   KNEE ARTHROSCOPY Right 12/20/2022   Procedure: RIGHT KNEE ARTHROSCOPY WITH DEBRIDEMENT, PARTIAL LATERAL MENISCECTOMY, AND EXCISION OF LOOSE BODY RIGHT KNEE.;  Surgeon: Edie Norleen PARAS, MD;  Location: [facility];  Service: Orthopedics;  Laterality: Right;   KNEE ARTHROSCOPY Right 12/25/2022   Procedure: RIGHT KNEE ARTHROSCOPY WITH IRRIGATION AND DEBRIDEMENT.;  Surgeon: Edie Norleen PARAS, MD;  Location: [facility];  Service: Orthopedics;  Laterality: Right;   KNEE SURGERY Left 1995   arthroscopy   PARTIAL KNEE ARTHROPLASTY Right 09/05/2017   Procedure: UNICOMPARTMENTAL KNEE;  Surgeon: Edie Norleen PARAS, MD;  Location: [facility];  Service: Orthopedics;  Laterality: Right;   PARTIAL KNEE ARTHROPLASTY Left 09/01/2019   Procedure: UNICOMPARTMENTAL KNEE;  Surgeon: Edie Norleen PARAS, MD;  Location: [facility];  Service: Orthopedics;  Laterality: Left;   TONSILLECTOMY     UMBILICAL HERNIA REPAIR N/A 09/27/2015   Procedure: HERNIA REPAIR UMBILICAL ADULT;  Surgeon: Reyes LELON Cota, MD;  Location: [facility];  Service: General;  Laterality: N/A;   XI ROBOTIC ASSISTED INGUINAL HERNIA REPAIR WITH MESH Left 07/28/2020   Procedure: XI ROBOTIC ASSISTED INGUINAL HERNIA REPAIR WITH MESH;  Surgeon: Jordis Laneta FALCON, MD;  Location: [facility];  Service: General;  Laterality: Left;    Family Psychiatric History: as below  Family History:  Family History  Problem Relation Age of Onset   Diabetes Mother    Colon cancer Maternal Aunt    Prostate cancer Maternal Uncle    Bladder Cancer Neg Hx    Kidney cancer Neg Hx     Social History:   Social History   Socioeconomic History   Marital status: Widowed    Spouse name: Not on  file   Number of children: 2   Years of education: Not on file   Highest education level: Bachelor's degree (e.g., BA, AB, BS)  Occupational History   Occupation: Geophysical data processor    Comment: retired  Tobacco Use   Smoking status: Former    Current packs/day: 0.00    Average packs/day: 0.8 packs/day for 40.0 years (30.0 ttl pk-yrs)    Types: Cigarettes, Cigars    Start date: 86    Quit date: 2009    Years since quitting: 16.5    Passive exposure: Past   Smokeless tobacco: Never  Vaping Use   Vaping status: Former  Substance and Sexual Activity   Alcohol use: No    Alcohol/week: 0.0 standard drinks of alcohol   Drug use: Yes    Types: Marijuana   Sexual activity: Yes  Other Topics Concern   Not on file  Social History Narrative   Lives alone but patient will stay with daughter [name] for a few days after surgery   Social Drivers of Health   Financial Resource Strain: Low Risk  (08/20/2023)   Received from [facility]   Overall Financial Resource Strain (CARDIA)    Difficulty of Paying Living Expenses: Not hard at all  Food Insecurity: No Food Insecurity (08/20/2023)   Received [facility]   Hunger Vital Sign    Within the past 12 months, you worried that your food would run out before you got the money to buy more.: Never true    Within the past 12 months, the food you bought just didn't last and you didn't have money to get more.: Never true  Transportation Needs: No Transportation Needs (08/20/2023)   Received from [facility] - Transportation    In the past 12 months, has lack of transportation kept you from medical appointments or from getting medications?: No    Lack of Transportation (Non-Medical): No  Physical Activity: Not on file  Stress: Not on file  Social Connections: Not on file    Additional Social History: as above  Allergies:   Allergies  Allergen Reactions   Fish-Derived  Products Nausea And Vomiting    Reaction to mussells only    Metabolic Disorder Labs: No results found for: HGBA1C, MPG No results found for: PROLACTIN No results found for: CHOL, TRIG, HDL,  CHOLHDL, VLDL, LDLCALC Lab Results  Component Value Date   TSH 0.769 06/04/2024    Therapeutic Level Labs: No results found for: LITHIUM No results found for: CBMZ No results found for: VALPROATE  Current Medications: Current Outpatient Medications  Medication Sig Dispense Refill   bisacodyl  (DULCOLAX) 5 MG EC tablet Take 1 tablet (5 mg total) by mouth daily as needed for moderate constipation. 30 tablet 0   escitalopram (LEXAPRO) 10 MG tablet Take 10 mg by mouth daily.     losartan-hydrochlorothiazide (HYZAAR) 100-12.5 MG tablet Take 1 tablet by mouth daily.     Maca Root (MACA PO) Take 1 capsule by mouth daily.     Multiple Vitamin (MULTI-VITAMINS) TABS Take 1 tablet by mouth daily.      Multiple Vitamins-Minerals (ZINC PO) Take 1 tablet by mouth daily.     omeprazole (PRILOSEC) 40 MG capsule Take 40 mg by mouth daily.     PSYLLIUM PO Take 3 capsules by mouth 2 (two) times daily.      rosuvastatin (CRESTOR) 10 MG tablet Take 10 mg by mouth daily.     tadalafil  (CIALIS ) 20 MG tablet Take 1 tablet (20 mg total) by mouth daily as needed for erectile dysfunction. 30 tablet 0   tadalafil  (CIALIS ) 5 MG tablet Take 1 tablet (5 mg total) by mouth daily as needed for erectile dysfunction. 90 tablet 3   traZODone  (DESYREL ) 50 MG tablet Take 25 mg by mouth at bedtime as needed for sleep.     No current facility-administered medications for this visit.    Musculoskeletal: Strength & Muscle Tone: within normal limits Gait & Station: normal Patient leans: N/A  Psychiatric Specialty Exam: Review of Systems  Psychiatric/Behavioral:  Positive for sleep disturbance. Negative for agitation, behavioral problems, confusion, decreased concentration, dysphoric mood, hallucinations,  self-injury and suicidal ideas. The patient is nervous/anxious. The patient is not hyperactive.   All other systems reviewed and are negative.   Blood pressure (!) 159/91, pulse (!) 103, temperature (!) 97.2 F (36.2 C), temperature source Temporal, height 5' 11 (1.803 m), weight 188 lb 3.2 oz (85.4 kg).Body mass index is 26.25 kg/m.  General Appearance: Well Groomed  Eye Contact:  Good  Speech:  Clear and Coherent  Volume:  Normal  Mood:  Anxious  Affect:  Appropriate, Congruent, and Full Range  Thought Process:  Coherent  Orientation:  Full (Time, Place, and Person)  Thought Content:  Logical  Suicidal Thoughts:  No  Homicidal Thoughts:  No  Memory:  Immediate;   Good  Judgement:  Good  Insight:  Good  Psychomotor Activity:  Normal  Concentration:  Concentration: Good and Attention Span: Good  Recall:  Good  Fund of Knowledge:Good  Language: Good  Akathisia:  No  Handed:  Right  AIMS (if indicated):  not done  Assets:  Communication Skills Desire for Improvement  ADL's:  Intact  Cognition: WNL  Sleep:  Fair   Screenings: Flowsheet Row Admission (Discharged) from 03/08/2023 in [facility] PER[facility] Pre-Admission Testing 45 from 02/28/2023 in [facility] PRE ADMISSION TESTING Admission (Discharged) from 01/07/2023 in [facility] [facility] PERIOP  C-SSRS RISK CATEGORY No Risk No Risk No Risk    Assessment and Plan:  EAIN MULLENDORE is a 73 y.o. year old male with a history of anxiety, alcohol use in sustained remission, CAD, GERD, T1c low risk prostate cancer, BPH s/p HoLEP, who is referred for anxiety.   1. Panic disorder 2.  GAD (generalized anxiety disorder) # r/o MDD The patient has a history of alcohol use for insomnia but is currently in sobriety and has been using marijuana over the past few years. His mother has a history of mental health issues. Psychologically, he describes his mother as very critical,  which contributed to persistent feelings of inadequacy and a belief that he was never good enough. 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 He is currently dealing with ongoing fatigue following a COVID infection, has undergone four surgeries, and is managing multiple health issues, which represents a significant change from his previously good physical condition. History: never seen by psychiatrist. Originally on lexapro 10 mg daily (started to be adherent in the last month)  He reports worsening in anxiety and panic attacks in the last 6 months, although it has been slightly improving since taking Lexapro consistently.  While he enjoys his connection with his children and grandchildren, he tends to withdraw from activities. Although he denies feeling depressed, he expresses a sense of guilt about not being able to engage in activities with his grandchildren due to his physical condition. Given he reports good benefit from Lexapro, and he decided to take it only for a month, will stay on the current dose to see if it exerts more benefit for anxiety.  Discussed potential risk of QTc prolongation especially from higher dose.  He will greatly benefit from CBT; will make a referral.  We obtain up to rule out medical health issues contributing to his mood symptoms.   # marijuana use He is at present with the latest stage for marijuana use.  Psychoeducation is provided for negative impact on his mental health Will continue motivational interview.    Plan Continue lexapro 10 mg daily  Obtain lab (TSH) Referral to therapy at [facility] Next appointment- 8/7 at 11:30, IP Plan to do screenings at his next visit  The patient demonstrates the following risk factors for suicide: Chronic risk factors for suicide include: psychiatric disorder of anxiety,  and substance use disorder. Acute risk  factors for suicide include: loss (financial, interpersonal, professional). Protective factors for this patient include: positive social support, coping skills, and hope for the future. Considering these factors, the overall suicide risk at this point appears to be low. Patient is appropriate for outpatient follow up.   Collaboration of Care: Other reviewed notes in Epic  Patient/Guardian was advised Release of Information must be obtained prior to any record release in order to collaborate their care with an outside provider. Patient/Guardian was advised if they have not already done so to contact the registration department to sign all necessary forms in order for us  to release information regarding their care.   A total of 60 minutes was spent on the following activities during the encounter date, which includes but is not limited to: preparing to see the patient (e.g., reviewing tests and records), obtaining and/or reviewing separately obtained history, performing a medically necessary examination or evaluation, counseling and educating the patient, family, or caregiver, ordering medications, tests, or procedures, referring and communicating with other healthcare professionals (when not reported separately), documenting clinical information in the electronic or paper health record, independently interpreting test or lab results and communicating these results to the family or caregiver, and coordinating care (when not reported separately).   Consent: Patient/Guardian gives verbal consent for treatment and assignment of  benefits for services provided during this visit. Patient/Guardian expressed understanding and agreed to proceed.   Katheren Sleet, MD 7/3/20257:04 PM

## 2024-06-04 ENCOUNTER — Ambulatory Visit: Admitting: Psychiatry

## 2024-06-04 ENCOUNTER — Other Ambulatory Visit: Payer: Self-pay

## 2024-06-04 ENCOUNTER — Ambulatory Visit: Payer: Self-pay | Admitting: Psychiatry

## 2024-06-04 ENCOUNTER — Encounter: Payer: Self-pay | Admitting: Psychiatry

## 2024-06-04 ENCOUNTER — Other Ambulatory Visit
Admission: RE | Admit: 2024-06-04 | Discharge: 2024-06-04 | Disposition: A | Discharge status: Home / Self Care | Source: Ambulatory Visit | Attending: Psychiatry | Admitting: Psychiatry

## 2024-06-04 VITALS — BP 159/91 | HR 103 | Temp 97.2°F | Ht 71.0 in | Wt 188.2 lb

## 2024-06-04 DIAGNOSIS — F410 Panic disorder [episodic paroxysmal anxiety] without agoraphobia: Secondary | ICD-10-CM

## 2024-06-04 DIAGNOSIS — F411 Generalized anxiety disorder: Secondary | ICD-10-CM

## 2024-06-04 LAB — TSH: TSH: 0.769 u[IU]/mL (ref 0.350–4.500)

## 2024-06-04 NOTE — Patient Instructions (Signed)
 Continue lexapro 10 mg daily  Obtain lab (TSH) Referral to therapy  Next appointment- 8/7 at 11:30

## 2024-06-08 ENCOUNTER — Encounter: Payer: Self-pay | Admitting: Urology

## 2024-06-08 ENCOUNTER — Ambulatory Visit: Admitting: Urology

## 2024-06-08 VITALS — BP 147/78 | HR 92 | Ht 71.0 in | Wt 188.5 lb

## 2024-06-08 DIAGNOSIS — N138 Other obstructive and reflux uropathy: Secondary | ICD-10-CM | POA: Diagnosis not present

## 2024-06-08 DIAGNOSIS — N401 Enlarged prostate with lower urinary tract symptoms: Secondary | ICD-10-CM | POA: Diagnosis not present

## 2024-06-08 DIAGNOSIS — C61 Malignant neoplasm of prostate: Secondary | ICD-10-CM

## 2024-06-08 NOTE — Progress Notes (Unsigned)
 06/08/2024 2:46 PM   Adrian Hart 01-17-1951 [id number]  Referring provider: Lenon Layman ORN, MD [address] [facility] [place],  [name] [address]  Chief Complaint  Patient presents with   Follow-up    Urologic history: 1. cT1c adenocarcinoma prostate low risk Biopsy 08/2018; abnormal DRE; PSA 2.7; 66 g prostate 4/12 cores positive Gleason 3+3 (LLB, LLM, LM, LA) 1-25% involvement Elected surveillance Prostate MRI (12/18/18) w/2 PI-RADS 4 lesions left base and left mid gland concerning for high-grade carcinoma, no adenopathy, capsule intact, normal seminal vesicles Confirmatory fusion biopsy 01/2019: PI-RADS 4 lesions were benign, left side of biopsy positive Gleason 3+3  2.  BPH with LUTS Urinary retention 2024, failed several voiding trials HoLEP 03/08/23  3.  Erectile dysfunction - Tadalafil  20 mg prn   HPI: 73 y.o. male presents for follow-up.  Only complaint since last year's visit is occasional episodes of urinary urgency with urge incontinence.  This happens ~2 times per month No dysuria or gross hematuria Flank, abdominal or pelvic pain PSA 05/22/2024 2.6   PMH: Past Medical History:  Diagnosis Date   Anxiety    Arthritis    COPD (chronic obstructive pulmonary disease) (HCC)    Coronary artery disease    GERD (gastroesophageal reflux disease)    PT TAKES APPLE CIDER VINEGAR    Hypertension    borderline pressures.  takes no meds   PONV (postoperative nausea and vomiting)    PONV x1 several years ago...also - urinarty retention after knee surgery (12/25/22)   Prostate cancer (HCC)     needs no treatment. still on observation   Thoracic aortic atherosclerosis [facility])     Surgical History: Past Surgical History:  Procedure Laterality Date   CATARACT EXTRACTION W/PHACO Left 01/07/2023   Procedure: CATARACT EXTRACTION PHACO AND INTRAOCULAR LENS PLACEMENT (IOC) LEFT 2.79 00:27.1;  Surgeon: Myrna Adine Anes, MD;  Location: [facility]  SURGERY CNTR;  Service: Ophthalmology;  Laterality: Left;   COLONOSCOPY  2003   Dr Desiderio   COLONOSCOPY WITH PROPOFOL  N/A 05/02/2018   Procedure: COLONOSCOPY WITH PROPOFOL ;  Surgeon: Viktoria Lamar DASEN, MD;  Location: [facility] ENDOSCOPY;  Service: Endoscopy;  Laterality: N/A;   EYE SURGERY Right    cataract   FLEXIBLE SIGMOIDOSCOPY  1996   Dr Dessa   HEMORRHOID SURGERY  04-20-04   Dr Dessa   HERNIA REPAIR  09/27/2015   Umbilical, primary repair   HOLEP-LASER ENUCLEATION OF THE PROSTATE WITH MORCELLATION N/A 03/08/2023   Procedure: HOLEP-LASER ENUCLEATION OF THE PROSTATE WITH MORCELLATION;  Surgeon: Francisca Redell JAYSON, MD;  Location: [facility] ORS;  Service: Urology;  Laterality: N/A;   KNEE ARTHROSCOPY Right 12/20/2022   Procedure: RIGHT KNEE ARTHROSCOPY WITH DEBRIDEMENT, PARTIAL LATERAL MENISCECTOMY, AND EXCISION OF LOOSE BODY RIGHT KNEE.;  Surgeon: Edie Norleen PARAS, MD;  Location: [facility] ORS;  Service: Orthopedics;  Laterality: Right;   KNEE ARTHROSCOPY Right 12/25/2022   Procedure: RIGHT KNEE ARTHROSCOPY WITH IRRIGATION AND DEBRIDEMENT.;  Surgeon: Edie Norleen PARAS, MD;  Location: [facility] ORS;  Service: Orthopedics;  Laterality: Right;   KNEE SURGERY Left 1995   arthroscopy   PARTIAL KNEE ARTHROPLASTY Right 09/05/2017   Procedure: UNICOMPARTMENTAL KNEE;  Surgeon: Edie Norleen PARAS, MD;  Location: [facility] ORS;  Service: Orthopedics;  Laterality: Right;   PARTIAL KNEE ARTHROPLASTY Left 09/01/2019   Procedure: UNICOMPARTMENTAL KNEE;  Surgeon: Edie Norleen PARAS, MD;  Location: [facility] ORS;  Service: Orthopedics;  Laterality: Left;   TONSILLECTOMY     UMBILICAL HERNIA REPAIR  N/A 09/27/2015   Procedure: HERNIA REPAIR UMBILICAL ADULT;  Surgeon: Reyes LELON Cota, MD;  Location: [facility] ORS;  Service: General;  Laterality: N/A;   XI ROBOTIC ASSISTED INGUINAL HERNIA REPAIR WITH MESH Left 07/28/2020   Procedure: XI ROBOTIC ASSISTED INGUINAL HERNIA REPAIR WITH MESH;  Surgeon: Jordis Laneta FALCON, MD;  Location: [facility] ORS;  Service: General;   Laterality: Left;    Home Medications:  Allergies as of 06/08/2024       Reactions   Fish-derived Products Nausea And Vomiting   Reaction to mussells only        Medication List        Accurate as of June 08, 2024  2:46 PM. If you have any questions, ask your nurse or doctor.          STOP taking these medications    bisacodyl  5 MG EC tablet Commonly known as: DULCOLAX Stopped by: Glendia Adrian Barba       TAKE these medications    escitalopram 10 MG tablet Commonly known as: LEXAPRO Take 10 mg by mouth daily.   losartan-hydrochlorothiazide 100-12.5 MG tablet Commonly known as: HYZAAR Take 1 tablet by mouth daily.   MACA PO Take 1 capsule by mouth daily.   Multi-Vitamins Tabs Take 1 tablet by mouth daily.   omeprazole 40 MG capsule Commonly known as: PRILOSEC Take 40 mg by mouth daily.   PSYLLIUM PO Take 3 capsules by mouth 2 (two) times daily.   rosuvastatin 10 MG tablet Commonly known as: CRESTOR Take 10 mg by mouth daily.   tadalafil  20 MG tablet Commonly known as: CIALIS  Take 1 tablet (20 mg total) by mouth daily as needed for erectile dysfunction.   tadalafil  5 MG tablet Commonly known as: CIALIS  Take 1 tablet (5 mg total) by mouth daily as needed for erectile dysfunction.   traZODone  50 MG tablet Commonly known as: DESYREL  Take 25 mg by mouth at bedtime as needed for sleep.   ZINC PO Take 1 tablet by mouth daily.        Allergies:  Allergies  Allergen Reactions   Fish-Derived Products Nausea And Vomiting    Reaction to mussells only    Family History: Family History  Problem Relation Age of Onset   Diabetes Mother    Colon cancer Maternal Aunt    Prostate cancer Maternal Uncle    Bladder Cancer Neg Hx    Kidney cancer Neg Hx     Social History:  reports that he quit smoking about 16 years ago. His smoking use included cigarettes and cigars. He started smoking about 56 years ago. He has a 30 pack-year smoking history. He has  been exposed to tobacco smoke. He has never used smokeless tobacco. He reports current drug use. Drug: Marijuana. He reports that he does not drink alcohol.   Physical Exam: BP (!) 147/78   Pulse 92   Ht 5' 11 (1.803 m)   Wt 188 lb 8 oz (85.5 kg)   BMI 26.29 kg/m   Constitutional:  Alert and oriented, No acute distress. HEENT: [place] AT Respiratory: Normal respiratory effort, no increased work of breathing. Psychiatric: Normal mood and affect.   Assessment & Plan:    1.  T1c low risk prostate cancer Stable PSA Lab visit in 6 months with PSA 1 follow up with PSA/DRE  2.  BPH with history urinary retention Status post HoLEP Occasional episodes of urge incontinence.  Not bothersome enough that he desires medication   Maayan Jenning C  Twylla, MD  [facility] [address], [address] [place], [name] [address] [phone]

## 2024-06-10 ENCOUNTER — Encounter: Payer: Self-pay | Admitting: Urology

## 2024-06-30 ENCOUNTER — Telehealth: Payer: Self-pay

## 2024-06-30 NOTE — Telephone Encounter (Signed)
 I would recommend initiating Buspar at a lower dose. However, I will defer this decision to his primary care provider, as he is scheduled to be seen tomorrow. Please let me/the front desk know if he would like to be seen sooner.

## 2024-06-30 NOTE — Telephone Encounter (Signed)
 Patient called stating that he was having really bad anxiety and over the weekend he called his PCP who prescribed him Buspar he seems to think it may be getting worse advised patient to call his PCP and let them know he stated that he wanted Dr Vickey to know please advise

## 2024-06-30 NOTE — Telephone Encounter (Signed)
 The patient stated that he is taking Buspar 15 mg 2 times a day he did speak with PCP and has an appointment tomorrow he did state that he stopped the Lexapro on yesterday but will start back.

## 2024-06-30 NOTE — Telephone Encounter (Signed)
 Please confirm the current dose of Buspar the patient is taking. It is advised to initiate at a lower dose to minimize the risk of anxiety exacerbation. Please ensure the patient continues taking Lexapro 10 mg as prescribed.

## 2024-07-01 DIAGNOSIS — R634 Abnormal weight loss: Secondary | ICD-10-CM | POA: Diagnosis not present

## 2024-07-01 DIAGNOSIS — F411 Generalized anxiety disorder: Secondary | ICD-10-CM | POA: Diagnosis not present

## 2024-07-01 NOTE — Progress Notes (Signed)
 Adrian Hart is a 73 y.o. male here for    CHIEF COMPLAINT:    Patient Active Problem List  Diagnosis  . Umbilical hernia  . Health care maintenance  . Prediabetes  . Status post right partial knee replacement  . Coronary artery disease involving native coronary artery  . Status post left partial knee replacement  . Raynaud's disease without gangrene  . Thoracic aortic atherosclerosis ()  . GAD (generalized anxiety disorder)  . GERD (gastroesophageal reflux disease)  . Tubulovillous adenoma of colon     HISTORY OF PRESENT ILLNESS:  [name] complains of anxiety and depression and wt loss. He notes his psychiatrist wanted him to see me. Some weakness, no energy. Tsh was ok earlier in the month. Some ha at times.   Past Medical History:  Diagnosis Date  . Abdominal hernia 2010   surgery to correct  . Arthritis 12/10/2016  . Cataract cortical, senile    Right eye October 2017  . COPD (chronic obstructive pulmonary disease) (CMS/HHS-HCC)   . Coronary artery disease involving native coronary artery 09/04/2018  . GAD (generalized anxiety disorder) 03/30/2022  . GERD (gastroesophageal reflux disease)   . PONV (postoperative nausea and vomiting)    PONV, hangover for 3 days  . Post-traumatic osteoarthritis of left knee 12/10/2016  . Prostate cancer (CMS/HHS-HCC) 09/2018    Past Surgical History:  Procedure Laterality Date  . Left knee arthroscopy  1995  . COLONOSCOPY  07/29/2002   Int Hemorrhoids, Diverticulosis: CBF 07/2012; Sch'ed 09/2017 then postponed d/t knee replacement per RTE; CBF 03/2018; Recall Ltr mailed 01/31/2018 (dh)  . HERNIA REPAIR  2016  . EXTRACTION CATARACT EXTRACAPSULAR W/INSERTION INTRAOCULAR PROSTHESIS Right 07/13/2015   Procedure: -R- LRI SUNG  EXTRACTION CATARACT EXTRACAPSULAR WITH PHACO WITH INSERTION INTRAOCULAR PROSTHESIS;  Surgeon: Therisa Phebe Senior, MD;  Location: [facility] OR;  Service: Ophthalmology;  Laterality: Right;  LRI/comx [place] NO  LENSX PER MICHELLE / 8/3 LR   . Right unicondylar knee arthroplasty. Right 09/05/2017   Dr. Edie  . COLONOSCOPY  05/02/2018   Adenomatous Polyps: CBF 04/2021  . Left unicondylar knee arthroplasty. Left 09/01/2019   Dr. Edie  . Extensive arthroscopic synovectomy with partial lateral meniscectomy and excision of loose body, right knee. Right 12/20/2022   Dr.Poggi  . Arthroscopic irrigation debridement of right knee with primary repair of right knee wounds Right 12/25/2022   Dr.Poggi  . Colon @ [facility]  03/18/2023   Normal examined colon/PHx CP/No repeat/TKT  . EGD @ [facility]  03/18/2023   Normal EGD biopsy/No Repeat/TKT  . CATARACT EXTRACTION  October 2017  . COLONOSCOPY  cant remember  . FLEXIBLE SIGMOIDOSCOPY  1996 ?   Dr. DOROTHA Cota @ [facility]  . KNEE ARTHROSCOPY    . TONSILLECTOMY     as a child     No fever chills or sweats   No nausea, vomiting or diarrhea  No chest pain, shortness of breath   Social History   Socioeconomic History  . Marital status: Widowed  Tobacco Use  . Smoking status: Former    Current packs/day: 0.50    Average packs/day: 0.5 packs/day for 30.0 years (15.0 ttl pk-yrs)    Types: Cigarettes, Pipe, Cigars  . Smokeless tobacco: Never  . Tobacco comments:    Quit cigarettes 10 years ago, currently smokes cigars.        States smoking occasionally, pipe tobacco. 08/23/2023 -IC   Vaping Use  . Vaping status: Never Used  Substance and Sexual Activity  .  Alcohol use: Not Currently  . Drug use: No  . Sexual activity: Not Currently    Partners: Female    Birth control/protection: None   Social Drivers of Health   Financial Resource Strain: Low Risk  (08/20/2023)   Overall Financial Resource Strain (CARDIA)   . Difficulty of Paying Living Expenses: Not hard at all  Food Insecurity: No Food Insecurity (08/20/2023)   Hunger Vital Sign   . Worried About Programme researcher, broadcasting/film/video in the Last Year: Never true   . Ran Out of Food in the Last Year: Never true   Transportation Needs: No Transportation Needs (08/20/2023)   PRAPARE - Transportation   . Lack of Transportation (Medical): No   . Lack of Transportation (Non-Medical): No  Housing Stability: Low Risk  (01/23/2024)   Housing Stability Vital Sign   . Unable to Pay for Housing in the Last Year: No   . Number of Times Moved in the Last Year: 0   . Homeless in the Last Year: No      Current Outpatient Medications:  .  escitalopram oxalate (LEXAPRO) 10 MG tablet, Take 1 tablet (10 mg total) by mouth once daily for 360 days, Disp: 90 tablet, Rfl: 3 .  fluticasone propion-salmeteroL (ADVAIR DISKUS) 100-50 mcg/dose diskus inhaler, Inhale 1 Puff into the lungs every 12 (twelve) hours, Disp: 1 each, Rfl: 12 .  ibuprofen (MOTRIN) 200 MG tablet, Take 200 mg by mouth as needed for Pain, Disp: , Rfl:  .  losartan-hydroCHLOROthiazide (HYZAAR) 100-12.5 mg tablet, TAKE 1 TABLET BY MOUTH EVERY DAY, Disp: 90 tablet, Rfl: 3 .  omeprazole (PRILOSEC) 40 MG DR capsule, TAKE 1 CAPSULE BY MOUTH EVERY DAY, Disp: 90 capsule, Rfl: 3 .  psyllium husk/aspartame (NATURAL PSYLLIUM FIBER ORAL), Take 1 tablet by mouth once daily.  , Disp: , Rfl:  .  rosuvastatin (CRESTOR) 10 MG tablet, TAKE 1 TABLET BY MOUTH EVERY DAY, Disp: 90 tablet, Rfl: 3 .  tadalafiL  (CIALIS ) 5 MG tablet, Take 5 mg by mouth once daily as needed for Erectile Dysfunction, Disp: , Rfl:  .  traZODone  (DESYREL ) 50 MG tablet, TAKE 1 TABLET BY MOUTH EVERY DAY AT BEDTIME AS NEEDED FOR SLEEP, Disp: 90 tablet, Rfl: 1  Vitals:   07/01/24 1018  BP: 133/78  Pulse: 68  Resp: 16   Body mass index is 24.97 kg/m. No acute distress HEENT: Normocephalic atraumatic. TM's clear. Op clear.  Lungs; clear to ascultation Heart; Regular rate and rhythm  Abdomen; Soft and flat, normal bowel sounds Extremities; No clubbing, cyanosis or edema  No visits with results within 3 Month(s) from this visit.  Latest known visit with results is:  Appointment on 01/27/2024   Component Date Value Ref Range Status  . Glucose 01/27/2024 114 (H)  70 - 110 mg/dL Final  . Sodium 97/75/7974 141  136 - 145 mmol/L Final  . Potassium 01/27/2024 3.9  3.6 - 5.1 mmol/L Final  . Chloride 01/27/2024 101  97 - 109 mmol/L Final  . Carbon Dioxide (CO2) 01/27/2024 31.3  22.0 - 32.0 mmol/L Final  . Urea Nitrogen (BUN) 01/27/2024 24  7 - 25 mg/dL Final  . Creatinine 97/75/7974 0.9  0.7 - 1.3 mg/dL Final  . Glomerular Filtration Rate (eGFR) 01/27/2024 91  >60 mL/min/1.73sq m Final  . Calcium 01/27/2024 9.7  8.7 - 10.3 mg/dL Final  . AST  97/75/7974 14  8 - 39 U/L Final  . ALT  01/27/2024 19  6 - 57 U/L Final  .  Alk Phos (alkaline Phosphatase) 01/27/2024 68  34 - 104 U/L Final  . Albumin 01/27/2024 4.4  3.5 - 4.8 g/dL Final  . Bilirubin, Total 01/27/2024 0.7  0.3 - 1.2 mg/dL Final  . Protein, Total 01/27/2024 6.4  6.1 - 7.9 g/dL Final  . A/G Ratio 97/75/7974 2.2  1.0 - 5.0 gm/dL Final  . Hemoglobin J8R 01/27/2024 6.3 (H)  4.2 - 5.6 % Final  . Average Blood Glucose (Calc) 01/27/2024 134  mg/dL Final  . Cholesterol, Total 01/27/2024 114  100 - 200 mg/dL Final  . Triglyceride 97/75/7974 85  35 - 199 mg/dL Final  . HDL (High Density Lipoprotein) Cho* 01/27/2024 41.1  29.0 - 71.0 mg/dL Final  . LDL Calculated 01/27/2024 56  0 - 130 mg/dL Final  . VLDL Cholesterol 01/27/2024 17  mg/dL Final  . Cholesterol/HDL Ratio 01/27/2024 2.8   Final    ASSESSMENT  AND PLAN:  Diagnoses and all orders for this visit:  Weight loss -     X-ray chest PA and lateral; Future -     CBC w/auto Differential (5 Part) -     Comprehensive Metabolic Panel (CMP) -     Sedimentation Rate-Automated -     C-Reactive Protein, Quant - [facility]  GAD (generalized anxiety disorder)  Start wu as noted but almost assuredly psychiatric  Needs remeron or seroquel etc.

## 2024-07-01 NOTE — Telephone Encounter (Signed)
 2nd attempt to call patient no answer left voicemail for patient to return call to office

## 2024-07-01 NOTE — Telephone Encounter (Signed)
 Patient notified and verbalized understanding. CT ordered.

## 2024-07-02 ENCOUNTER — Encounter: Payer: Self-pay | Admitting: Psychiatry

## 2024-07-02 ENCOUNTER — Ambulatory Visit (INDEPENDENT_AMBULATORY_CARE_PROVIDER_SITE_OTHER): Admitting: Psychiatry

## 2024-07-02 VITALS — BP 138/88 | HR 106 | Temp 98.2°F | Ht 71.0 in | Wt 175.2 lb

## 2024-07-02 DIAGNOSIS — F321 Major depressive disorder, single episode, moderate: Secondary | ICD-10-CM | POA: Diagnosis not present

## 2024-07-02 DIAGNOSIS — F410 Panic disorder [episodic paroxysmal anxiety] without agoraphobia: Secondary | ICD-10-CM

## 2024-07-02 DIAGNOSIS — F411 Generalized anxiety disorder: Secondary | ICD-10-CM | POA: Diagnosis not present

## 2024-07-02 MED ORDER — PRAZOSIN HCL 1 MG PO CAPS: 1.0000 mg | ORAL_CAPSULE | Freq: Every day | ORAL | 0 refills | Status: DC

## 2024-07-02 MED ORDER — HYDROXYZINE HCL 25 MG PO TABS: 25.0000 mg | ORAL_TABLET | Freq: Every day | ORAL | 0 refills | Status: DC | PRN

## 2024-07-02 NOTE — Patient Instructions (Signed)
 Continue lexapro 10 mg daily  Start prazosin  1 mg at night  Start hydroxyzine  25 mg daily as needed for anxiety  Next appointment- 8/7 at 11:30

## 2024-07-02 NOTE — Progress Notes (Signed)
 [facility] MD/PA/NP OP Progress Note  07/02/2024 2:47 PM Adrian Hart  MRN:  [id number]  Chief Complaint:  Chief Complaint  Patient presents with   Follow-up   HPI:  This is a follow-up appointment for anxiety, panic disorder.  This appointment was made urgently due to concern of the symptoms.  He states that he was feeling very frustrated on the way here.  He was confused.  GPS told him to go to the other place, and he had difficulty afterwards.  There was another episode of him not be able to find his car.  He states that the memory is a big problem, although it used to be very sharp.  He also feels clammy all the time.  He does not want to live this way.  He has anxiety every day.  He tends to stay at home lately due to his symptoms.  He states that he went to have a breakfast with his friend.  His mind was off, somewhere.  He also reports decrease in appetite, and has lost significant amount of weight.  He has intense anxiety with tingling sensation in his hands. He feels very hot from head to hands.His eyes were dilated, like he was on LSD. He had profuse sweating.  It occurred last Monday.  He was started on buspirone 15 mg twice a day by his primary care provider, and discontinued it after a day as he had this panic attack.  He thinks the anxiety itself has been getting slightly better every week despite these attacks.  He was able to function later in the day even after having a panic attack.  He denies SI, stating that he has 4 grandchildren.  He denies HI.  He shares an episode of hearing his wife's voice, stating Adrian Hart, is that you?  He also saw an elephant in the yard, although he thinks he was light asleep about a week ago.  He has not used any THC since the last visit.  He politely asks if he can have medication for anxiety, although he is not being on medication.   PTSD- He states that he also has dreams about the work.  This tends to make him feel down as he thinks about the time.  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  He attended 3 funerals for the kids in one year.  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  He is concerned that political climate, and reports frustration about the recent conversation with his neighbor, who used racial slur.  He states that he used to be easygoing, although he is now irritate.    Substance use   Tobacco Alcohol Other substances/  Current   Abstinence for 38 years, denies craving Marijuana for pain, since 2022/s/p knee surgery, it helps for anxiety, attention. 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 Panic disorder  F41.0     2. GAD (generalized anxiety disorder)  F41.1     3. Current moderate episode of major depressive disorder without prior episode (HCC)  F32.1       Past Psychiatric History: Please see initial evaluation for full details. I have reviewed the history. No updates at this time.     Past Medical History:  Past Medical History:  Diagnosis Date   Anxiety    Arthritis    COPD (chronic obstructive pulmonary disease) (HCC)    Coronary artery disease    GERD (gastroesophageal reflux disease)    PT TAKES APPLE CIDER VINEGAR    Hypertension    borderline pressures.  takes no meds   PONV (postoperative nausea and vomiting)    PONV x1 several years ago...also - urinarty retention after knee surgery (12/25/22)   Prostate cancer (HCC)     needs no treatment. still on  observation   Thoracic aortic atherosclerosis [facility])     Past Surgical History:  Procedure Laterality Date   CATARACT EXTRACTION W/PHACO Left 01/07/2023   Procedure: CATARACT EXTRACTION PHACO AND INTRAOCULAR LENS PLACEMENT (IOC) LEFT 2.79 00:27.1;  Surgeon: Myrna Adine Anes, MD;  [facility]: [facility];  Service: Ophthalmology;  Laterality: Left;   COLONOSCOPY  2003   Dr Desiderio   COLONOSCOPY WITH PROPOFOL  N/A 05/02/2018   Procedure: COLONOSCOPY WITH PROPOFOL ;  Surgeon: Viktoria Lamar DASEN, MD;  Location: [facility] END[facility];  Service: Endoscopy;  Laterality: N/A;   EYE SURGERY Right    cataract   FLEXIBLE SIGMOIDOSCOPY  1996   Dr Dessa   HEMORRHOID SURGERY  04-20-04   Dr Dessa   HERNIA REPAIR  09/27/2015   Umbilical, primary repair   HOLEP-LASER ENUCLEATION OF THE PROSTATE WITH MORCELLATION N/A 03/08/2023   Procedure: HOLEP-LASER ENUCLEATION OF THE PROSTATE WITH MORCELLATION;  Surgeon: Francisca Redell BROCKS, MD;  Location: [facility];  Service: Urology;  Laterality: N/A;   KNEE ARTHROSCOPY Right 12/20/2022   Procedure: RIGHT KNEE ARTHROSCOPY WITH DEBRIDEMENT, PARTIAL LATERAL MENISCECTOMY, AND EXCISION OF LOOSE BODY RIGHT KNEE.;  Surgeon: Edie Norleen PARAS, MD;  Location: [facility];  Service: Orthopedics;  Laterality: Right;   KNEE ARTHROSCOPY Right 12/25/2022   Procedure: RIGHT KNEE ARTHROSCOPY WITH IRRIGATION AND DEBRIDEMENT.;  Surgeon: Edie Norleen PARAS, MD;  Location: [facility];  Service: Orthopedics;  Laterality: Right;   KNEE SURGERY Left 1995   arthroscopy   PARTIAL KNEE ARTHROPLASTY Right 09/05/2017   Procedure: UNICOMPARTMENTAL KNEE;  Surgeon: Edie Norleen PARAS, MD;  Location: [facility];  Service: Orthopedics;  Laterality: Right;   PARTIAL KNEE ARTHROPLASTY Left 09/01/2019   Procedure: UNICOMPARTMENTAL KNEE;  Surgeon: Edie Norleen PARAS, MD;  Location: [facility];  Service: Orthopedics;  Laterality: Left;   TONSILLECTOMY     UMBILICAL HERNIA REPAIR N/A 09/27/2015   Procedure: HERNIA REPAIR UMBILICAL ADULT;   Surgeon: Reyes LELON Dessa, MD;  Location: [facility];  Service: General;  Laterality: N/A;   XI ROBOTIC ASSISTED INGUINAL HERNIA REPAIR WITH MESH Left 07/28/2020   Procedure: XI ROBOTIC ASSISTED INGUINAL HERNIA REPAIR WITH MESH;  Surgeon: Jordis Laneta FALCON, MD;  Location: [facility];  Service: General;  Laterality: Left;    Family Psychiatric History: Please see initial evaluation for full details. I have reviewed the history. No updates at this time.     Family History:  Family History  Problem Relation Age of Onset   Diabetes Mother    Colon cancer Maternal  Aunt    Prostate cancer Maternal Uncle    Bladder Cancer Neg Hx    Kidney cancer Neg Hx     Social History:  Social History   Socioeconomic History   Marital status: Widowed    Spouse name: Not on file   Number of children: 2   Years of education: Not on file   Highest education level: Bachelor's degree (e.g., BA, AB, BS)  Occupational History   Occupation: Geophysical data processor    Comment: retired  Tobacco Use   Smoking status: Former    Current packs/day: 0.00    Average packs/day: 0.8 packs/day for 40.0 years (30.0 ttl pk-yrs)    Types: Cigarettes, Cigars    Start date: 5    Quit date: 2009    Years since quitting: 16.5    Passive exposure: Past   Smokeless tobacco: Never  Vaping Use   Vaping status: Former  Substance and Sexual Activity   Alcohol use: No    Alcohol/week: 0.0 standard drinks of alcohol   Drug use: Yes    Types: Marijuana   Sexual activity: Yes  Other Topics Concern   Not on file  Social History Narrative   Lives alone but patient will stay with daughter [name] for a few days after surgery   Social Drivers of Health   Financial Resource Strain: Low Risk  (08/20/2023)   Received from [facility]   Overall Financial Resource Strain (CARDIA)    Difficulty of Paying Living Expenses: Not hard at all  Food Insecurity: No Food Insecurity (08/20/2023)   Received from [facility]   Hunger Vital Sign    Within the past 12 months, you worried that your food would run out before you got the money to buy more.: Never true    Within the past 12 months, the food you bought just didn't last and you didn't have money to get more.: Never true  Transportation Needs: No Transportation Needs (08/20/2023)   Received from [facility]    In the past 12 months, has lack of transportation kept you from medical appointments or from getting medications?: No    Lack of Transportation (Non-Medical): No  Physical Activity: Not on file  Stress: Not on file  Social Connections: Not on file    Allergies:  Allergies  Allergen Reactions   Fish-Derived Products Nausea And Vomiting    Reaction to mussells only    Metabolic Disorder Labs: No results found for: HGBA1C, MPG No results found for: PROLACTIN No results found for: CHOL, TRIG, HDL, CHOLHDL, VLDL, LDLCALC Lab Results  Component Value Date   TSH 0.769 06/04/2024    Therapeutic Level Labs: No results found for: LITHIUM No results found for: VALPROATE No results found for: CBMZ  Current Medications: Current Outpatient Medications  Medication Sig Dispense Refill   doxycycline (VIBRAMYCIN) 100 MG capsule Take 100 mg by mouth 2 (two) times daily. for 7 days     escitalopram (LEXAPRO) 10 MG tablet Take 10 mg by mouth daily.     fluticasone-salmeterol (ADVAIR) 100-50 MCG/ACT AEPB Inhale 1 puff into the lungs.     hydrOXYzine  (ATARAX ) 25 MG tablet Take 1 tablet (25 mg total) by mouth daily as needed for anxiety. 30 tablet 0   losartan-hydrochlorothiazide (HYZAAR) 100-12.5 MG tablet Take 1 tablet by mouth daily.     Maca Root (MACA PO) Take 1 capsule by mouth daily.  Multiple Vitamin (MULTI-VITAMINS) TABS Take 1 tablet by mouth daily.      Multiple Vitamins-Minerals (ZINC PO) Take 1 tablet by mouth daily.     omeprazole (PRILOSEC)  40 MG capsule Take 40 mg by mouth daily.     prazosin  (MINIPRESS ) 1 MG capsule Take 1 capsule (1 mg total) by mouth at bedtime. 30 capsule 0   PSYLLIUM PO Take 3 capsules by mouth 2 (two) times daily.      rosuvastatin (CRESTOR) 10 MG tablet Take 10 mg by mouth daily.     tadalafil  (CIALIS ) 20 MG tablet Take 1 tablet (20 mg total) by mouth daily as needed for erectile dysfunction. 30 tablet 0   tadalafil  (CIALIS ) 5 MG tablet Take 1 tablet (5 mg total) by mouth daily as needed for erectile dysfunction. 90 tablet 3   traZODone  (DESYREL ) 50 MG tablet Take 25 mg by mouth at bedtime as needed for sleep.     No current facility-administered medications for this visit.     Musculoskeletal: Strength & Muscle Tone: within normal limits Gait & Station: normal Patient leans: N/A  Psychiatric Specialty Exam: Review of Systems  Psychiatric/Behavioral:  Positive for decreased concentration, dysphoric mood and sleep disturbance. Negative for agitation, behavioral problems, confusion, hallucinations, self-injury and suicidal ideas. The patient is nervous/anxious. The patient is not hyperactive.   All other systems reviewed and are negative.   Blood pressure 138/88, pulse (!) 106, temperature 98.2 F (36.8 C), temperature source Temporal, height 5' 11 (1.803 m), weight 175 lb 3.2 oz (79.5 kg), SpO2 99%.Body mass index is 24.44 kg/m.  General Appearance: Well Groomed  Eye Contact:  Good  Speech:  Clear and Coherent  Volume:  Normal  Mood:  Anxious  Affect:  Appropriate, Congruent, and tense  Thought Process:  Coherent and Goal Directed  Orientation:  Full (Time, Place, and Person)  Thought Content: Logical   Suicidal Thoughts:  No  Homicidal Thoughts:  No  Memory:  Immediate;   Good  Judgement:  Good  Insight:  Good  Psychomotor Activity:  Normal  Concentration:  Concentration: Good and Attention Span: Good  Recall:  Good  Fund of Knowledge: Good  Language: Good  Akathisia:  No  Handed:   Right  AIMS (if indicated): not done  Assets:  Communication Skills Desire for Improvement  ADL's:  Intact  Cognition: WNL  Sleep:  Poor   Screenings: PHQ2-9    Flowsheet Row Office Visit from [facility]/02/2024 in [facility]  PHQ-2 Total Score 5  PHQ-9 Total Score 19   Flowsheet Row Office Visit from 06/04/2024 in [facility] Admission (Discharged) from 03/08/2023 in [facility] Pre-Admission Testing 45 from 02/28/2023 in [facility]  C-SSRS RISK CATEGORY No Risk No Risk No Risk     Assessment and Plan:  Adrian Hart is a 73 y.o. year old male with a history of anxiety, alcohol use in sustained remission, CAD, GERD, T1c low risk prostate cancer, BPH s/p HoLEP, who presents for follow up appointment for below.   1. Panic disorder 2. GAD (generalized anxiety disorder) # MDD, single episode, moderate # r/o PTSD The patient has a history of alcohol use for insomnia but is currently in sobriety and has been using marijuana over the past few years. His mother has a history of mental health issues. Psychologically, he describes his mother as very critical, which contributed to persistent feelings  of inadequacy and a belief that he was never good enough. 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 He is currently dealing with ongoing fatigue following a COVID infection, has undergone four surgeries, and is managing multiple health issues, which represents a significant change from his previously good physical condition. History: never seen by psychiatrist. Originally on lexapro 10 mg daily (started to be adherent in the last month)   He had a few episodes of panic attacks since the last visit, and continues to struggle  with anxiety, and depressive symptoms.  This coincided with being abstinent from [facility] use, and be started on buspar 15 mg twice a day by his primary care provider.  However, he reports overall improvement in this in reality of panic attacks since being on Lexapro.  While he denies any recent specific triggers, he reports experiencing vivid work-related dreams, intrusive thoughts, and heightened hypervigilance--symptoms that appear to be compounded by the current political climate.  Will start prazosin  to target vivid dreams, and hyperarousal symptoms related to PTSD.  Discussed potential risk of drowsiness, orthostatic hypotension.  Will start from the lowest dose concerning his age.  Quetiapine, BuSpar could be another option if he has limited benefit from this.  Will continue current dose of Lexapro to target GAD, and panic disorder off label given it has been beneficial.  Will start hydroxyzine  as needed for severe anxiety.  Discussed potential risk of drowsiness.  He will greatly benefit from CBT.  He has an upcoming appointment with Ms. Sharp.  # marijuana use He has been abstinent since the last visit.  Will continue motivational interviewing.   # r/o cognitive impairment He reports difficulty in cognition.  It is difficult to assess due to the severity of his anxiety.  Will continue to assess and intervene as needed.   # weight loss He reports significant weight loss with appetite loss, although it has been reportedly improving over time.  He was just started doxycycline for some infection, although it is not clear from the chart review.  Will continue to assess this.    Plan Continue lexapro 10 mg daily  Start prazosin  1 mg at night  Start hydroxyzine  25 mg daily as needed for anxiety  Next appointment- 8/7 at 11:30, IP  Plan to do screenings at his next visit   The patient demonstrates the following risk factors for suicide: Chronic risk factors for suicide include: psychiatric disorder of  anxiety,  and substance use disorder. Acute risk factors for suicide include: loss (financial, interpersonal, professional). Protective factors for this patient include: positive social support, coping skills, and hope for the future. Considering these factors, the overall suicide risk at this point appears to be low. Patient is appropriate for outpatient follow up.    A total of 45 minutes was spent on the following activities during the encounter date, which includes but is not limited to: preparing to see the patient (e.g., reviewing tests and records), obtaining and/or reviewing separately obtained history, performing a medically necessary examination or evaluation, counseling and educating the patient, family, or caregiver, ordering medications, tests, or procedures, referring and communicating with other healthcare professionals (when not reported separately), documenting clinical information in the electronic or paper health record, independently interpreting test or lab results and communicating these results to the family or caregiver, and coordinating care (when not reported separately).   Collaboration  of Care: Collaboration of Care: Other reviewed notes in Epic  Patient/Guardian was advised Release of Information must be obtained prior to any record release in order to collaborate their care with an outside provider. Patient/Guardian was advised if they have not already done so to contact the registration department to sign all necessary forms in order for us  to release information regarding their care.   Consent: Patient/Guardian gives verbal consent for treatment and assignment of benefits for services provided during this visit. Patient/Guardian expressed understanding and agreed to proceed.    Katheren Sleet, MD 07/02/2024, 2:47 PM

## 2024-07-03 ENCOUNTER — Other Ambulatory Visit: Payer: Self-pay | Admitting: Internal Medicine

## 2024-07-03 DIAGNOSIS — R911 Solitary pulmonary nodule: Secondary | ICD-10-CM
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
## 2024-07-06 ENCOUNTER — Ambulatory Visit
Admission: RE | Admit: 2024-07-06 | Discharge: 2024-07-06 | Disposition: A | Discharge status: Home / Self Care | Source: Ambulatory Visit | Attending: Internal Medicine | Admitting: Internal Medicine

## 2024-07-06 DIAGNOSIS — C61 Malignant neoplasm of prostate: Secondary | ICD-10-CM | POA: Diagnosis not present

## 2024-07-06 DIAGNOSIS — R079 Chest pain, unspecified: Secondary | ICD-10-CM | POA: Diagnosis not present

## 2024-07-06 DIAGNOSIS — R911 Solitary pulmonary nodule: Secondary | ICD-10-CM | POA: Insufficient documentation

## 2024-07-06 DIAGNOSIS — R918 Other nonspecific abnormal finding of lung field: Secondary | ICD-10-CM | POA: Diagnosis not present

## 2024-07-06 MED ORDER — IOHEXOL 300 MG/ML  SOLN
75.0000 mL | Freq: Once | INTRAMUSCULAR | Status: AC | PRN
  Administered 2024-07-06: 75 mL via INTRAVENOUS

## 2024-07-09 ENCOUNTER — Encounter: Payer: Self-pay | Admitting: Psychiatry

## 2024-07-09 ENCOUNTER — Other Ambulatory Visit: Payer: Self-pay

## 2024-07-09 ENCOUNTER — Ambulatory Visit (INDEPENDENT_AMBULATORY_CARE_PROVIDER_SITE_OTHER): Admitting: Psychiatry

## 2024-07-09 VITALS — BP 155/90 | HR 93 | Temp 96.9°F | Ht 71.0 in | Wt 181.8 lb

## 2024-07-09 DIAGNOSIS — F321 Major depressive disorder, single episode, moderate: Secondary | ICD-10-CM

## 2024-07-09 DIAGNOSIS — E538 Deficiency of other specified B group vitamins: Secondary | ICD-10-CM

## 2024-07-09 DIAGNOSIS — F411 Generalized anxiety disorder: Secondary | ICD-10-CM | POA: Diagnosis not present

## 2024-07-09 DIAGNOSIS — F410 Panic disorder [episodic paroxysmal anxiety] without agoraphobia: Secondary | ICD-10-CM | POA: Diagnosis not present

## 2024-07-09 DIAGNOSIS — Z1321 Encounter for screening for nutritional disorder: Secondary | ICD-10-CM

## 2024-07-09 MED ORDER — PRAZOSIN HCL 1 MG PO CAPS: 1.0000 mg | ORAL_CAPSULE | Freq: Every day | ORAL | 0 refills | Status: DC

## 2024-07-09 MED ORDER — SERTRALINE HCL 50 MG PO TABS
ORAL_TABLET | ORAL | 1 refills | Status: DC
Start: 2024-07-09 — End: 2024-08-01

## 2024-07-09 NOTE — Patient Instructions (Signed)
 Start sertraline  25 mg at night for two weeks then 50 mg at night  Decrease lexapro 5 mg daily for one week, then discontinue Continue prazosin  1 mg at night  Continue hydroxyzine  25 mg daily as needed for anxiety  Obtain labs (vitamin B12, folate, vitamin D) at [facility] Next appointment- 9/22 at 10:30

## 2024-07-13 DIAGNOSIS — Z1321 Encounter for screening for nutritional disorder: Secondary | ICD-10-CM | POA: Diagnosis not present

## 2024-07-13 DIAGNOSIS — E538 Deficiency of other specified B group vitamins: Secondary | ICD-10-CM | POA: Diagnosis not present

## 2024-07-14 ENCOUNTER — Ambulatory Visit (INDEPENDENT_AMBULATORY_CARE_PROVIDER_SITE_OTHER): Admitting: Clinical

## 2024-07-14 ENCOUNTER — Ambulatory Visit: Payer: Self-pay | Admitting: Psychiatry

## 2024-07-14 DIAGNOSIS — F1021 Alcohol dependence, in remission: Secondary | ICD-10-CM

## 2024-07-14 DIAGNOSIS — F4323 Adjustment disorder with mixed anxiety and depressed mood: Secondary | ICD-10-CM | POA: Diagnosis not present

## 2024-07-14 LAB — VITAMIN D 25 HYDROXY (VIT D DEFICIENCY, FRACTURES): Vit D, 25-Hydroxy: 51.6 ng/mL (ref 30.0–100.0)

## 2024-07-14 LAB — FOLATE: Folate: >20 ng/mL (ref >3.0)

## 2024-07-14 LAB — VITAMIN B12: Vitamin B-12: 658 pg/mL (ref 232–1245)
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
## 2024-07-14 NOTE — Progress Notes (Signed)
   Darice Seats, LCSW

## 2024-07-24 ENCOUNTER — Other Ambulatory Visit: Payer: Self-pay | Admitting: Psychiatry

## 2024-07-29 DIAGNOSIS — R7303 Prediabetes: Secondary | ICD-10-CM | POA: Diagnosis not present

## 2024-07-29 DIAGNOSIS — I2510 Atherosclerotic heart disease of native coronary artery without angina pectoris: Secondary | ICD-10-CM | POA: Diagnosis not present

## 2024-07-31 ENCOUNTER — Other Ambulatory Visit: Payer: Self-pay | Admitting: Psychiatry

## 2024-08-05 DIAGNOSIS — Z0000 Encounter for general adult medical examination without abnormal findings: Secondary | ICD-10-CM | POA: Diagnosis not present

## 2024-08-05 DIAGNOSIS — I2510 Atherosclerotic heart disease of native coronary artery without angina pectoris: Secondary | ICD-10-CM | POA: Diagnosis not present

## 2024-08-05 DIAGNOSIS — Z1331 Encounter for screening for depression: Secondary | ICD-10-CM | POA: Diagnosis not present

## 2024-08-05 DIAGNOSIS — R7303 Prediabetes: Secondary | ICD-10-CM | POA: Diagnosis not present

## 2024-08-11 ENCOUNTER — Ambulatory Visit (INDEPENDENT_AMBULATORY_CARE_PROVIDER_SITE_OTHER): Admitting: Clinical

## 2024-08-11 DIAGNOSIS — F4323 Adjustment disorder with mixed anxiety and depressed mood: Secondary | ICD-10-CM

## 2024-08-11 DIAGNOSIS — F1021 Alcohol dependence, in remission: Secondary | ICD-10-CM | POA: Diagnosis not present

## 2024-08-11 NOTE — Progress Notes (Signed)
 [facility] Counselor/Therapist Progress Note  Patient ID: Adrian Hart, MRN: [id number]    Date: 08/11/24  Time Spent: 9:34  am - 10:25 am : 51 Minutes  Treatment Type: Individual Therapy.  Reported Symptoms: Patient reported recent improvement in mood, energy, and appetite  Mental Status Exam: Appearance:  Neat and Well Groomed     Behavior: Appropriate  Motor: Normal  Speech/Language:  Clear and Coherent and Normal Rate  Affect: Appropriate  Mood: normal  Thought process: normal  Thought content:   WNL  Sensory/Perceptual disturbances:   WNL  Orientation: oriented to person, place, time/date, and situation  Attention: Good  Concentration: Good  Memory: WNL  Fund of knowledge:  Good  Insight:   Good  Judgment:  Good  Impulse Control: Good   Risk Assessment: Danger to Self:  No Patient denied current suicidal ideation  Self-injurious Behavior: No Danger to Others: No Patient denied current homicidal ideation Duty to Warn:no Physical Aggression / Violence:No  Access to Firearms a concern: No  Gang Involvement:No   Subjective:  Patient stated, its been going better in response to events since last session. Patient stated, mood wise, energy wise, appetite coming back in response to changes since last session. Patient stated, good, relaxed in response to current mood. Patient stated, I think I've experienced anxiety throughout my life. Patient stated, before COVID I was very active socially and physically. Patient stated, I agree with what you've said so far in response to diagnoses. Patient stated, I agree in response to treatment recommendations. Patient stated, I definitely want to be motivated again, I do want to work on the depression and anxiety, I want to function normally, I do want to have a relationship with someone in response to goals for therapy. Patient stated, I feel awkward as far as meeting people, I am kind of lost.  Patient stated, maybe work on some grief, I want to get back into to being an activity part of the family in response to goals for therapy.   Interventions: Motivational Interviewing. Clinician conducted session in person at clinician's office at [facility]. Reviewed events since last session and assessed for changes. Clinician reviewed diagnoses and treatment recommendations. Provided psycho education related to diagnoses and treatment. Clinician utilized motivational interviewing to explore potential goals for therapy. Clinician utilized a task centered approach in collaboration with patient to develop goals for therapy. Patient participated in development of goals and agreed to goals for therapy.   Collaboration of Care: Other not required at this time  Diagnosis:  Adjustment disorder with mixed anxiety and depressed mood  Alcohol use disorder, moderate, in sustained remission (HCC)   Plan: Patient is to utilize [employer] [facility], thought re-framing, relaxation techniques, mindfulness, behavioral activation, and coping strategies to decrease symptoms associated with their diagnosis. Frequency: bi-weekly  Modality: individual     Long-term goal:   Reduce overall level, frequency, and intensity of the feelings of depression and anxiety as evidenced by decrease in tremors, decreased concentration, obsessive thoughts, irritability, easily agitated, tingling in hands, feeling heat in extremities, restlessness, feeling on edge, fatigue, feelings unsettled, depressed mood, decreased appetite, loss of interest, low energy, decreased motivation, anxiety when in public settings and avoids going to stores from 2-3 times per month to 0 times per month per patient report for at least 3 consecutive months. Target Date: 08/11/25  Progress: established 08/11/24   Short-term goal:  Increase participation in positive activities, such as, getting up each day, develop a  plan for each day,  exercise, attending granddaughter's activities, attending social opportunities, kayaking, painting, family activities from 0 days per week to 7 days per week Target Date: 08/11/25  Progress: established 08/11/24   Increase participation in AA events per patient's report Target Date: 08/11/25  Progress: established 08/11/24   Practice coping strategies, such as, relaxation techniques, mindfulness exercises, meditation, grounding exercises 2-3 times per month in response to symptoms of anxiety  Target Date: 08/11/25  Progress: established 08/11/24   Identify, challenge, and replace negative thought patterns and negative self talk that contribute to feelings of depression and anxiety with positive thoughts, beliefs, and positive self talk per patient's report Target Date: 08/11/25  Progress: established 08/11/24   Being a healthy grieving process Target Date: 08/11/25  Progress: established 08/11/24   Develop realistic expectations as it relates to relationships Target Date: 08/11/25  Progress: established 08/11/24                     Darice Seats, LCSW

## 2024-08-22 ENCOUNTER — Other Ambulatory Visit: Payer: Self-pay | Admitting: Psychiatry

## 2024-08-23 NOTE — Progress Notes (Unsigned)
 BH MD/PA/NP OP Progress Note  08/24/2024 11:16 AM Adrian Hart  MRN:  [id number]  Chief Complaint:  Chief Complaint  Patient presents with   Follow-up   HPI:  This is a follow-up appointment for depression, panic disorder and anxiety.   He states that he has been doing well.  Although he has little anxiety, it is more manageable.  He took hydroxyzine  only once or so since the last visit.  He has been able to getting out more.  He enjoyed visiting to [place], and enjoys the time with his child and grandchildren.  He also enjoyed going to [place] to see games for them.  However, he wishes that he get involved in activities more.  This makes him feel a little down.  He has not been able to do exercise, work cleaning out the house.  He partly attributes this to history of attention deficit.  He tends to get distracted.  He also tends to get tired.  Although he takes a nap for a few hours, he does not feel refreshed.  He sleeps 6 hours, being refreshed in the morning.  He has a little more appetite.  He denies SI.  He uses marijuana once or twice as it made him feel relaxed. Although he denies any fall, he felt a little dizzy when he tried to change his head position.  He agrees with the plans as outlined.   Wt Readings from Last 3 Encounters:  08/24/24 183 lb 6.4 oz (83.2 kg)  07/09/24 181 lb 12.8 oz (82.5 kg)  07/02/24 175 lb 3.2 oz (79.5 kg)      Substance use   Tobacco Alcohol Other substances/  Current   Abstinence for 38 years, denies craving Used marijuana, a few times since the last visit as it made him feel relaxed.  Marijuana for pain, since 2022/s/p knee surgery, it helps for anxiety, attention. 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 Panic disorder  F41.0     2. GAD (generalized anxiety disorder)  F41.1     3. Current mild episode of major depressive disorder without prior episode (HCC)  F32.0       Past Psychiatric History: Please see initial evaluation for full details. I have reviewed the history. No updates at this time.     Past Medical History:  Past Medical History:  Diagnosis Date   Anxiety    Arthritis    COPD (chronic obstructive pulmonary disease) (HCC)    Coronary artery disease    GERD (gastroesophageal reflux disease)    PT TAKES APPLE CIDER VINEGAR    Hypertension    borderline pressures.  takes no meds   PONV (postoperative nausea and vomiting)    PONV x1 several years ago...also - urinarty retention after knee surgery (12/25/22)   Prostate cancer (HCC)     needs no treatment. still on observation   Thoracic aortic atherosclerosis [facility])     Past Surgical History:  Procedure Laterality  Date   CATARACT EXTRACTION W/PHACO Left 01/07/2023   Procedure: CATARACT EXTRACTION PHACO AND INTRAOCULAR LENS PLACEMENT (IOC) LEFT 2.79 00:27.1;  Surgeon: Myrna Adine Anes, MD;  Location: [facility];  Service: Ophthalmology;  Laterality: Left;   COLONOSCOPY  2003   Dr Desiderio   COLONOSCOPY WITH PROPOFOL  N/A 05/02/2018   Procedure: COLONOSCOPY WITH PROPOFOL ;  Surgeon: Viktoria Lamar DASEN, MD;  Location: [facility];  Service: Endoscopy;  Laterality: N/A;   EYE SURGERY Right    cataract   FLEXIBLE SIGMOIDOSCOPY  1996   Dr Dessa   HEMORRHOID SURGERY  04-20-04   Dr Dessa   HERNIA REPAIR  09/27/2015   Umbilical, primary repair   HOLEP-LASER ENUCLEATION OF THE PROSTATE WITH MORCELLATION N/A 03/08/2023   Procedure: HOLEP-LASER ENUCLEATION OF THE PROSTATE WITH MORCELLATION;  Surgeon: Francisca Redell BROCKS, MD;  Location: [facility];   Service: Urology;  Laterality: N/A;   KNEE ARTHROSCOPY Right 12/20/2022   Procedure: RIGHT KNEE ARTHROSCOPY WITH DEBRIDEMENT, PARTIAL LATERAL MENISCECTOMY, AND EXCISION OF LOOSE BODY RIGHT KNEE.;  Surgeon: Edie Norleen PARAS, MD;  Location: [facility];  Service: Orthopedics;  Laterality: Right;   KNEE ARTHROSCOPY Right 12/25/2022   Procedure: RIGHT KNEE ARTHROSCOPY WITH IRRIGATION AND DEBRIDEMENT.;  Surgeon: Edie Norleen PARAS, MD;  Location: [facility];  Service: Orthopedics;  Laterality: Right;   KNEE SURGERY Left 1995   arthroscopy   PARTIAL KNEE ARTHROPLASTY Right 09/05/2017   Procedure: UNICOMPARTMENTAL KNEE;  Surgeon: Edie Norleen PARAS, MD;  Location: [facility];  Service: Orthopedics;  Laterality: Right;   PARTIAL KNEE ARTHROPLASTY Left 09/01/2019   Procedure: UNICOMPARTMENTAL KNEE;  Surgeon: Edie Norleen PARAS, MD;  Location: [facility];  Service: Orthopedics;  Laterality: Left;   TONSILLECTOMY     UMBILICAL HERNIA REPAIR N/A 09/27/2015   Procedure: HERNIA REPAIR UMBILICAL ADULT;  Surgeon: Reyes LELON Dessa, MD;  Location: [facility];  Service: General;  Laterality: N/A;   XI ROBOTIC ASSISTED INGUINAL HERNIA REPAIR WITH MESH Left 07/28/2020   Procedure: XI ROBOTIC ASSISTED INGUINAL HERNIA REPAIR WITH MESH;  Surgeon: Jordis Laneta FALCON, MD;  Location: [facility];  Service: General;  Laterality: Left;    Family Psychiatric History: Please see initial evaluation for full details. I have reviewed the history. No updates at this time.     Family History:  Family History  Problem Relation Age of Onset   Diabetes Mother    Colon cancer Maternal Aunt    Prostate cancer Maternal Uncle    Bladder Cancer Neg Hx    Kidney cancer Neg Hx     Social History:  Social History   Socioeconomic History   Marital status: Widowed    Spouse name: Not on file   Number of children: 2   Years of education: Not on file   Highest education level: Bachelor's degree (e.g., BA, AB, BS)  Occupational History   Occupation: Licensed conveyancer    Comment: retired  Tobacco Use   Smoking status: Former    Current packs/day: 0.00    Average packs/day: 0.8 packs/day for 40.0 years (30.0 ttl pk-yrs)    Types: Cigarettes, Cigars    Start date: 50    Quit date: 2009    Years since quitting: 16.7    Passive exposure: Past   Smokeless tobacco: Never  Vaping Use   Vaping status: Former  Substance and Sexual Activity   Alcohol use: No    Alcohol/week: 0.0 standard drinks of alcohol   Drug use: Yes  Types: Marijuana   Sexual activity: Yes  Other Topics Concern   Not on file  Social History Narrative   Lives alone but patient will stay with daughter [name] for a few days after surgery   Social Drivers of Health   Financial Resource Strain: Low Risk  (08/05/2024)   Received [facility]   Overall Financial Resource Strain (CARDIA)    Difficulty of Paying Living Expenses: Not hard at all  Food Insecurity: No Food Insecurity (08/05/2024)   Received from [facility]   Hunger Vital Sign    Within the past 12 months, you worried that your food would run out before you got the money to buy more.: Never true    Within the past 12 months, the food you bought just didn't last and you didn't have money to get more.: Never true  Transportation Needs: No Transportation Needs (08/05/2024)   Received from [facility] - Transportation    In the past 12 months, has lack of transportation kept you from medical appointments or from getting medications?: No    Lack of Transportation (Non-Medical): No  Physical Activity: Not on file  Stress: Not on file  Social Connections: Not on file    Allergies:  Allergies  Allergen Reactions   Fish-Derived Products Nausea And Vomiting    Reaction to mussells only    Metabolic Disorder Labs: No results found for: HGBA1C, MPG No results found for: PROLACTIN No results found for: CHOL, TRIG, HDL, CHOLHDL,  VLDL, LDLCALC Lab Results  Component Value Date   TSH 0.769 06/04/2024    Therapeutic Level Labs: No results found for: LITHIUM No results found for: VALPROATE No results found for: CBMZ  Current Medications: Current Outpatient Medications  Medication Sig Dispense Refill   sertraline  (ZOLOFT ) 50 MG tablet Take 1 tablet (50 mg total) by mouth daily. (Patient taking differently: Take 100 mg by mouth daily.) 90 tablet 0   fluticasone-salmeterol (ADVAIR) 100-50 MCG/ACT AEPB Inhale 1 puff into the lungs.     hydrOXYzine  (ATARAX ) 25 MG tablet Take 1 tablet (25 mg total) by mouth daily as needed for anxiety. 90 tablet 0   losartan-hydrochlorothiazide (HYZAAR) 100-12.5 MG tablet Take 1 tablet by mouth daily.     Maca Root (MACA PO) Take 1 capsule by mouth daily.     Multiple Vitamin (MULTI-VITAMINS) TABS Take 1 tablet by mouth daily.      Multiple Vitamins-Minerals (ZINC PO) Take 1 tablet by mouth daily.     omeprazole (PRILOSEC) 40 MG capsule Take 40 mg by mouth daily.     prazosin  (MINIPRESS ) 1 MG capsule Take 1 capsule (1 mg total) by mouth at bedtime. 90 capsule 0   PSYLLIUM PO Take 3 capsules by mouth 2 (two) times daily.      rosuvastatin (CRESTOR) 10 MG tablet Take 10 mg by mouth daily.     tadalafil  (CIALIS ) 20 MG tablet Take 1 tablet (20 mg total) by mouth daily as needed for erectile dysfunction. 30 tablet 0   tadalafil  (CIALIS ) 5 MG tablet Take 1 tablet (5 mg total) by mouth daily as needed for erectile dysfunction. 90 tablet 3   traZODone  (DESYREL ) 50 MG tablet Take 25 mg by mouth at bedtime as needed for sleep.     No current facility-administered medications for this visit.     Musculoskeletal: Strength & Muscle Tone: within normal limits Gait & Station: normal Patient leans: N/A  Psychiatric Specialty  Exam: Review of Systems  Psychiatric/Behavioral:  Positive for decreased concentration, dysphoric mood and sleep disturbance. Negative for agitation, behavioral  problems, confusion, hallucinations, self-injury and suicidal ideas. The patient is nervous/anxious. The patient is not hyperactive.   All other systems reviewed and are negative.   Blood pressure (!) 162/92, pulse 81, temperature (!) 97.3 F (36.3 C), temperature source Temporal, height 5' 11 (1.803 m), weight 183 lb 6.4 oz (83.2 kg).Body mass index is 25.58 kg/m.  General Appearance: Well Groomed  Eye Contact:  Good  Speech:  Clear and Coherent  Volume:  Normal  Mood:  good  Affect:  Appropriate, Congruent, and calm  Thought Process:  Coherent  Orientation:  Full (Time, Place, and Person)  Thought Content: Logical   Suicidal Thoughts:  No  Homicidal Thoughts:  No  Memory:  Immediate;   Good  Judgement:  Good  Insight:  Good  Psychomotor Activity:  Normal  Concentration:  Concentration: Good and Attention Span: Good  Recall:  Good  Fund of Knowledge: Good  Language: Good  Akathisia:  No  Handed:  Right  AIMS (if indicated): not done  Assets:  Communication Skills Desire for Improvement  ADL's:  Intact  Cognition: WNL  Sleep:  Good   Screenings: GAD-7    Flowsheet Row Office Visit from 07/09/2024 in [facility]  Total GAD-7 Score 9   PHQ2-9    Flowsheet Row Office Visit from 07/09/2024 in [facility] Office Visit from 07/02/2024 in [facility]  PHQ-2 Total Score 2 5  PHQ-9 Total Score 8 19   Flowsheet Row Office Visit from 06/04/2024 in [facility] Admission (Discharged) from 03/08/2023 in [facility] Pre-Admission Testing 45 from 02/28/2023 in [facility] PRE ADMISS[facility] TESTING  C-SSRS RISK CATEGORY No Risk No Risk No Risk     Assessment and Plan:  Adrian Hart is a 73 y.o. year old male with a history of anxiety, alcohol use in sustained remission,  CAD, GERD, T1c low risk prostate cancer, BPH s/p HoLEP, who presents for follow up appointment for below.    1. Panic disorder 2. GAD (generalized anxiety disorder) 3. Current mild episode of major depressive disorder without prior episode [facility]) # r/o PTSD The patient has a history of alcohol use for insomnia but is currently in sobriety and has been using marijuana over the past few years. His mother has a history of mental health issues. Psychologically, he describes his mother as very critical, which contributed to persistent feelings of inadequacy and a belief that he was never good enough. 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 He is currently dealing with ongoing fatigue following a COVID infection, has undergone four surgeries, and is managing multiple health issues, which represents a significant change from his previously good physical condition. History: never seen by psychiatrist. Originally on lexapro 10 mg daily (started to be adherent in the last month)    There has been steady improvement in depressive symptoms and anxiety  since cross tapering from Lexapro to sertraline .  He has not had any panic attacks, and has been able to limit the use of hydroxyzine .  Although there is a concern of some drowsiness, will try higher dose of sertraline  to optimize treatment for panic disorder,  anxiety and depression.  Will continue prazosin  to target vivid dreams, hyperarousal symptoms related to PTSD given he has significant benefit from this medication.  Discussed potential risk of orthostatic hypotension.  Will continue hydroxyzine  as needed for anxiety. He will continue to see Ms. Sharpe for therapy.   # r/o cognitive impairment - TSH wnl 06/2024 , folate, vitamin B 12 wnl 07/2024 He reports occasional episodes of confusion, and reports difficulty in attention.  Given the  etiology could be multifactorial, will prioritize the treatment for mood symptoms as outlined above. We will plan to assess with [facility] after his mood improves for further assessment.    Plan Increase sertraline  100 mg at night - monitor fatigue Continue prazosin  1 mg at night  Continue hydroxyzine  25 mg daily as needed for anxiety - he declined a refill Next appointment- 11/17 at 11:30, IP   The patient demonstrates the following risk factors for suicide: Chronic risk factors for suicide include: psychiatric disorder of anxiety,  and substance use disorder. Acute risk factors for suicide include: loss (financial, interpersonal, professional). Protective factors for this patient include: positive social support, coping skills, and hope for the future. Considering these factors, the overall suicide risk at this point appears to be low. Patient is appropriate for outpatient follow up.    Past trials of medication: lexapro, buspar  Collaboration of Care: Collaboration of Care: Other reviewed notes in Epic  Patient/Guardian was advised Release of Information must be obtained prior to any record release in order to collaborate their care with an outside provider. Patient/Guardian was advised if they have not already done so to contact the registration department to sign all necessary forms in order for us  to release information regarding their care.   Consent: Patient/Guardian gives verbal consent for treatment and assignment of benefits for services provided during this visit. Patient/Guardian expressed understanding and agreed to proceed.    Katheren Sleet, MD 08/24/2024, 11:16 AM

## 2024-08-24 ENCOUNTER — Ambulatory Visit (INDEPENDENT_AMBULATORY_CARE_PROVIDER_SITE_OTHER): Admitting: Psychiatry

## 2024-08-24 ENCOUNTER — Other Ambulatory Visit: Payer: Self-pay

## 2024-08-24 ENCOUNTER — Encounter: Payer: Self-pay | Admitting: Psychiatry

## 2024-08-24 VITALS — BP 162/92 | HR 81 | Temp 97.3°F | Ht 71.0 in | Wt 183.4 lb

## 2024-08-24 DIAGNOSIS — F411 Generalized anxiety disorder: Secondary | ICD-10-CM | POA: Diagnosis not present

## 2024-08-24 DIAGNOSIS — F320 Major depressive disorder, single episode, mild: Secondary | ICD-10-CM

## 2024-08-24 DIAGNOSIS — F410 Panic disorder [episodic paroxysmal anxiety] without agoraphobia: Secondary | ICD-10-CM | POA: Diagnosis not present

## 2024-08-24 NOTE — Patient Instructions (Signed)
 Increase sertraline  100 mg at night  Continue prazosin  1 mg at night  Continue hydroxyzine  25 mg daily as needed for anxiety  Next appointment- 11/17 at 11:30

## 2024-08-25 ENCOUNTER — Ambulatory Visit (INDEPENDENT_AMBULATORY_CARE_PROVIDER_SITE_OTHER): Admitting: Clinical

## 2024-08-25 DIAGNOSIS — F4323 Adjustment disorder with mixed anxiety and depressed mood: Secondary | ICD-10-CM | POA: Diagnosis not present

## 2024-08-25 DIAGNOSIS — F1021 Alcohol dependence, in remission: Secondary | ICD-10-CM | POA: Diagnosis not present

## 2024-08-25 NOTE — Progress Notes (Signed)
 [facility] Counselor/Therapist Progress Note  Patient ID: Adrian Hart, MRN: [id number],    Date: 08/25/2024  Time Spent: 1:33pm - 2:36pm : 63 minutes   Treatment Type: Individual Therapy  Reported Symptoms: loneliness, anxiety, lack of energy and motivation  Mental Status Exam: Appearance:  Neat and Well Groomed     Behavior: Appropriate  Motor: Normal  Speech/Language:  Clear and Coherent and Normal Rate  Affect: Appropriate  Mood: normal  Thought process: normal  Thought content:   WNL  Sensory/Perceptual disturbances:   WNL  Orientation: oriented to person, place, time/date, and situation  Attention: Good  Concentration: Good  Memory: WNL  Fund of knowledge:  Good  Insight:   Good  Judgment:  Good  Impulse Control: Good   Risk Assessment: Danger to Self:  No Patient denied current suicidal ideation  Self-injurious Behavior: No Danger to Others: No Patient denied current homicidal ideation Duty to Warn:no Physical Aggression / Violence:No  Access to Firearms a concern: No  Gang Involvement:No   Subjective:  Patient stated, I would say I'm probably more engaged in things in response to events since last sessions. Patient reported patient spent the weekend in [place] with daughter and daughter's family and recently attended granddaughter's volleyball game. Patient stated, I think that's really improved in reference to motivation to participate in activities. Patient reported increased energy in the mornings and stated, I don't have a whole lot of anxiety. Patient reported anxiety related to going to grocery store and stated,  its like a generalized anxiety. Patient stated,  I think the medications helped.  Patient stated, Id like to think it was me in reference to contributing factors to improvement in symptoms. Patient stated, better, pretty positive in response to mood. Patient reported feelings of anger in response to news and has been  avoiding watching the news. Patient stated,  energy level, the desire to do it are barriers to participation in activities. Patient reported previously when experiencing confused patient reported feeling he was not staying hydrated. Patient stated, I think I was beginning to worry about a lot of things and causing more anxiety. Patient stated, three years ago it was like I didn't have enough time in the day and reported patient's activity level decreased during COVID pandemic. Patient reported three years ago patient was exercising, hiking, socializing with others, fishing, kayaking, participating in family activities, attending AA meetings. Patient reported patient would like to resume kayaking and fishing. Patient identified the following steps towards goal of fishing: driving to store, walking into store, walking to fishing section of store, purchase fishing line, replace fishing line on fishing rods, gather fishing supplies, putting fishing gear into car, travel to [employer], get out of vehicle with fishing gear. Patient stated,  I know I need to do that in response to behavorial activation steps.   Interventions: Cognitive Behavioral Therapy.  Clinician conducted session in person at clinician's office at [facility]. Reviewed events since last session and assessed for changes. Discussed recent improvement in mood and explored contributing factors to improvement in mood.  Explored and identified barriers to participation in activities. Discussed following up with PCP to discuss decreased energy and fatigue. Reviewed patient's activity level prior to COVID pandemic and changes in activity level in response to pandemic. Provided psycho education related to behavioral activation and dividing activities/tasks into smaller steps. Assisted patient in dividing goal of fishing into smaller steps. Provided psycho education related to cognitive distortions. Clinician requested for homework patient  record thoughts associated with anxiety in stores.    Collaboration of Care: Other not required at this time   Diagnosis:  Adjustment disorder with mixed anxiety and depressed mood   Alcohol use disorder, moderate, in sustained remission (HCC)     Plan: Patient is to utilize [employer] [facility], thought re-framing, relaxation techniques, mindfulness, behavioral activation, and coping strategies to decrease symptoms associated with their diagnosis. Frequency: bi-weekly  Modality: individual      Long-term goal:   Reduce overall level, frequency, and intensity of the feelings of depression and anxiety as evidenced by decrease in tremors, decreased concentration, obsessive thoughts, irritability, easily agitated, tingling in hands, feeling heat in extremities, restlessness, feeling on edge, fatigue, feelings unsettled, depressed mood, decreased appetite, loss of interest, low energy, decreased motivation, anxiety when in public settings and avoids going to stores from 2-3 times per month to 0 times per month per patient report for at least 3 consecutive months. Target Date: 08/11/25  Progress: progressing    Short-term goal:  Increase participation in positive activities, such as, getting up each day, develop a plan for each day, exercise, attending granddaughter's activities, attending social opportunities, kayaking, painting, family activities from 0 days per week to 7 days per week Target Date: 08/11/25  Progress: progressing    Increase participation in AA events per patient's report Target Date: 08/11/25  Progress: progressing    Practice coping strategies, such as, relaxation techniques, mindfulness exercises, meditation, grounding exercises 2-3 times per month in response to symptoms of anxiety  Target Date: 08/11/25  Progress: progressing    Identify, challenge, and replace negative thought patterns and negative self talk that contribute to feelings of depression and anxiety  with positive thoughts, beliefs, and positive self talk per patient's report Target Date: 08/11/25  Progress: progressing    Being a healthy grieving process Target Date: 08/11/25  Progress: progressing    Develop realistic expectations as it relates to relationships Target Date: 08/11/25  Progress: progressing                              Darice Seats, LCSW

## 2024-08-25 NOTE — Progress Notes (Signed)
   Darice Seats, LCSW

## 2024-08-26 DIAGNOSIS — H04123 Dry eye syndrome of bilateral lacrimal glands: Secondary | ICD-10-CM | POA: Diagnosis not present

## 2024-08-26 DIAGNOSIS — Z961 Presence of intraocular lens: Secondary | ICD-10-CM | POA: Diagnosis not present

## 2024-08-26 DIAGNOSIS — H40003 Preglaucoma, unspecified, bilateral: Secondary | ICD-10-CM | POA: Diagnosis not present

## 2024-09-14 DIAGNOSIS — D485 Neoplasm of uncertain behavior of skin: Secondary | ICD-10-CM | POA: Diagnosis not present

## 2024-09-14 DIAGNOSIS — D235 Other benign neoplasm of skin of trunk: Secondary | ICD-10-CM | POA: Diagnosis not present

## 2024-09-14 DIAGNOSIS — C44719 Basal cell carcinoma of skin of left lower limb, including hip: Secondary | ICD-10-CM | POA: Diagnosis not present

## 2024-09-14 DIAGNOSIS — Z85828 Personal history of other malignant neoplasm of skin: Secondary | ICD-10-CM | POA: Diagnosis not present

## 2024-09-14 DIAGNOSIS — D225 Melanocytic nevi of trunk: Secondary | ICD-10-CM | POA: Diagnosis not present

## 2024-09-14 DIAGNOSIS — D2262 Melanocytic nevi of left upper limb, including shoulder: Secondary | ICD-10-CM | POA: Diagnosis not present

## 2024-09-14 DIAGNOSIS — L820 Inflamed seborrheic keratosis: Secondary | ICD-10-CM | POA: Diagnosis not present

## 2024-09-14 DIAGNOSIS — D2261 Melanocytic nevi of right upper limb, including shoulder: Secondary | ICD-10-CM | POA: Diagnosis not present

## 2024-09-14 DIAGNOSIS — D239 Other benign neoplasm of skin, unspecified: Secondary | ICD-10-CM | POA: Diagnosis not present

## 2024-09-14 DIAGNOSIS — R208 Other disturbances of skin sensation: Secondary | ICD-10-CM | POA: Diagnosis not present

## 2024-09-24 ENCOUNTER — Ambulatory Visit: Admitting: Clinical

## 2024-09-24 DIAGNOSIS — F4323 Adjustment disorder with mixed anxiety and depressed mood: Secondary | ICD-10-CM

## 2024-09-24 DIAGNOSIS — F1021 Alcohol dependence, in remission: Secondary | ICD-10-CM

## 2024-09-24 NOTE — Progress Notes (Signed)
   Darice Seats, LCSW

## 2024-09-24 NOTE — Progress Notes (Addendum)
 [facility] Counselor/Therapist Progress Note  Patient ID: Adrian Hart, MRN: [id number],    Date: 09/24/2024  Time Spent: 12:36pm - 1:39pm : 63 minutes   Treatment Type: Individual Therapy  Reported Symptoms: increased sleep  Mental Status Exam: Appearance:  Neat and Well Groomed     Behavior: Appropriate  Motor: Normal  Speech/Language:  Clear and Coherent and Normal Rate  Affect: Appropriate  Mood: normal  Thought process: normal  Thought content:   WNL  Sensory/Perceptual disturbances:   WNL  Orientation: oriented to person, place, time/date, and situation  Attention: Good  Concentration: Good  Memory: WNL  Fund of knowledge:  Good  Insight:   Good  Judgment:  Good  Impulse Control: Good   Risk Assessment: Danger to Self:  No Patient denied current suicidal ideation  Self-injurious Behavior: No Danger to Others: No Patient denied current homicidal ideation Duty to Warn:no Physical Aggression / Violence:No  Access to Firearms a concern: No  Gang Involvement:No   Subjective: Patient stated, been going ok in response to events since last session. Patient stated, I think a change in my sleep pattern and that's thrown me off some in response to changes since last session. Patient reported sertraline  dosage has been doubled and patient reported increased sleep in response to change in dosage. Patient reported taking a nap during the day and stated, it doesn't feel like I slept.  Patient reported feeling groggy in the afternoon.  Patient reported patient goes to bed at approximately 10/10:30pm and reported waking up around 7/7:30am. Patient reported good in response to quality of sleep. Patient stated, kind of even keel in response to mood since last session. Patient stated, very little anxiety, I still don't like being around people. Patient stated, I still have that feeling is this all there is, I do have anxiety about that. Patient stated,  I'm lonely, especially at night, I miss my wife, bottom line. Patient reported wife passed away 6 years ago from a brain tumor. Patient reported wife's death was sudden. Patient reported wife was diagnosed in February/March and passed away in 05/17/2024. Patient reported patient attended grief counseling and stated, that seemed to help. Patient stated, as soon as I saw her I knew in reference to being in love with wife. Patient reported patient/wife met in college and patient stated, she taught me what love was by role modeling. Patient stated, she was a fantastic mother, there was a lot of love in the house.  Patient stated, some times I don't know what I want or where I'm at. Patient reported patient's wife had just retired and they were making plans for their retirement when wife passed away. Patient reported patient was on the phone the day wife died and when patient got off the phone patient didn't hear anything and knew something was wrong. Patient reported patient's children do not discuss their mother and patient would like to talk about his wife. Patient stated, it seems to be a topic people dance around, I actually would like to let them know how there mother really was. Patient stated, a big part of me died in reference to wife's death.   Interventions: Cognitive Behavioral Therapy. Clinician conducted session in person at clinician's office at [facility]. Reviewed events since last session and assessed for changes. Discussed recent changes in sleep patterns. Explored patient's sleep routine/hygiene. Provided psycho education related to healthy sleep hygiene. Assessed frequency and intensity of anxiety since last session. Discussed triggers for anxiety.  Explored feelings of loneliness. Encouraged patient to discussed the loss of patient's wife's. Explored feelings of grief. Provided psycho education related to grief. Provided reflective listening and normalized feelings of  grief.    Collaboration of Care: Other not required at this time   Diagnosis:  Adjustment disorder with mixed anxiety and depressed mood   Alcohol use disorder, moderate, in sustained remission (HCC)     Plan: Patient is to utilize [employer], thought re-framing, relaxation techniques, mindfulness, behavioral activation, and coping strategies to decrease symptoms associated with their diagnosis. Frequency: bi-weekly  Modality: individual      Long-term goal:   Reduce overall level, frequency, and intensity of the feelings of depression and anxiety as evidenced by decrease in tremors, decreased concentration, obsessive thoughts, irritability, easily agitated, tingling in hands, feeling heat in extremities, restlessness, feeling on edge, fatigue, feelings unsettled, depressed mood, decreased appetite, loss of interest, low energy, decreased motivation, anxiety when in public settings and avoids going to stores from 2-3 times per month to 0 times per month per patient report for at least 3 consecutive months. Target Date: 08/11/25  Progress: progressing    Short-term goal:  Increase participation in positive activities, such as, getting up each day, develop a plan for each day, exercise, attending granddaughter's activities, attending social opportunities, kayaking, painting, family activities from 0 days per week to 7 days per week Target Date: 08/11/25  Progress: progressing    Increase participation in AA events per patient's report Target Date: 08/11/25  Progress: progressing    Practice coping strategies, such as, relaxation techniques, mindfulness exercises, meditation, grounding exercises 2-3 times per month in response to symptoms of anxiety  Target Date: 08/11/25  Progress: progressing    Identify, challenge, and replace negative thought patterns and negative self talk that contribute to feelings of depression and anxiety with positive thoughts, beliefs, and positive  self talk per patient's report Target Date: 08/11/25  Progress: progressing    Being a healthy grieving process Target Date: 08/11/25  Progress: progressing    Develop realistic expectations as it relates to relationships Target Date: 08/11/25  Progress: progressing     Darice Seats, LCSW

## 2024-09-29 ENCOUNTER — Other Ambulatory Visit: Payer: Self-pay | Admitting: Internal Medicine

## 2024-09-29 DIAGNOSIS — R911 Solitary pulmonary nodule: Secondary | ICD-10-CM

## 2024-09-29 DIAGNOSIS — D235 Other benign neoplasm of skin of trunk: Secondary | ICD-10-CM | POA: Diagnosis not present

## 2024-09-29 DIAGNOSIS — D225 Melanocytic nevi of trunk: Secondary | ICD-10-CM | POA: Diagnosis not present

## 2024-10-05 ENCOUNTER — Telehealth: Payer: Self-pay

## 2024-10-05 DIAGNOSIS — I2510 Atherosclerotic heart disease of native coronary artery without angina pectoris: Secondary | ICD-10-CM | POA: Diagnosis not present

## 2024-10-05 DIAGNOSIS — F411 Generalized anxiety disorder: Secondary | ICD-10-CM | POA: Diagnosis not present

## 2024-10-05 DIAGNOSIS — R7303 Prediabetes: Secondary | ICD-10-CM | POA: Diagnosis not present

## 2024-10-05 DIAGNOSIS — Z23 Encounter for immunization: Secondary | ICD-10-CM | POA: Diagnosis not present

## 2024-10-05 MED ORDER — SERTRALINE HCL 100 MG PO TABS: 100.0000 mg | ORAL_TABLET | Freq: Every day | ORAL | 0 refills | Status: DC

## 2024-10-05 NOTE — Telephone Encounter (Signed)
 Medication management - Message left for patient, after he left a message he was in need of a refill of medication and requested a call back. Patient did not state what medication he was looking for so requested he call back to provide this information.  Also, informed patient on voicemail left it appeared the three medications prescribed to him by Dr. Vickey were all last sent in September and all for a 90 day supply so requested he also contact his pharmacy to see if what he needs may be on file there too.

## 2024-10-05 NOTE — Telephone Encounter (Signed)
 Medication refill - Patient called back today to clarify he is in need of a new Sertraline  100 mg order to be sent into his [facility] on [address] [facility] [address], last ordered 08/08/24 for 90 days of the 50 mg dosage. Patient reported this was increased on 08/24/24 and was taking 2 of the 50 mg a day until he is now running out.  Patient requests this be [facility] today as he will be out tonight.

## 2024-10-05 NOTE — Telephone Encounter (Signed)
 Medication refill - Called patient back to inform Dr. Coby, covering for Dr. Vickey today, sent in patient's newly requested Sertraline  100 mg order [facility] on [address] [facility]. Patient to call back if any issues filling as prescribed.

## 2024-10-05 NOTE — Telephone Encounter (Signed)
 I have sent sertraline  100 mg to pharmacy for 30 days as requested.

## 2024-10-06 DIAGNOSIS — L600 Ingrowing nail: Secondary | ICD-10-CM | POA: Diagnosis not present

## 2024-10-06 DIAGNOSIS — R2231 Localized swelling, mass and lump, right upper limb: Secondary | ICD-10-CM | POA: Diagnosis not present

## 2024-10-06 DIAGNOSIS — M7989 Other specified soft tissue disorders: Secondary | ICD-10-CM | POA: Diagnosis not present

## 2024-10-08 ENCOUNTER — Ambulatory Visit: Admitting: Clinical

## 2024-10-08 DIAGNOSIS — F1021 Alcohol dependence, in remission: Secondary | ICD-10-CM | POA: Diagnosis not present

## 2024-10-08 DIAGNOSIS — F4323 Adjustment disorder with mixed anxiety and depressed mood: Secondary | ICD-10-CM | POA: Diagnosis not present

## 2024-10-08 NOTE — Progress Notes (Unsigned)
   Darice Seats, LCSW

## 2024-10-08 NOTE — Progress Notes (Unsigned)
  Behavioral Health Counselor/Therapist Progress Note  Patient ID: Adrian Hart, MRN: [id number],    Date: 10/08/2024  Time Spent: 12:38pm  - 1:29pm : 51 minutes   Treatment Type: Individual Therapy  Reported Symptoms: decreased motivation  Mental Status Exam: Appearance:  Neat and Well Groomed     Behavior: Appropriate  Motor: Normal  Speech/Language:  Clear and Coherent and Normal Rate  Affect: Appropriate  Mood: normal  Thought process: normal  Thought content:   WNL  Sensory/Perceptual disturbances:   WNL  Orientation: oriented to person, place, time/date, and situation  Attention: Good  Concentration: Good  Memory: WNL  Fund of knowledge:  Good  Insight:   Good  Judgment:  Good  Impulse Control: Good   Risk Assessment: Danger to Self:  No Patient denied current suicidal ideation  Self-injurious Behavior: No Danger to Others: No Patient denied current homicidal ideation Duty to Warn:no Physical Aggression / Violence:No  Access to Firearms a concern: No  Gang Involvement:No   Subjective:  Patient stated, been doing ok, good actually, been doing more things in response to events since last session. Patient reported patient has been walking and stated, going out more with people. Patient reported today patient plans to see an artist display and stated, I'm trying to get out of it. Patient reported driving at night and patient's eye sight are barriers to attending artist display. Patient reported the individual that invited patient to the artist display is interested in a relationship with patient and patient stated, I think about the work that goes into it in reference to relationships. Patient stated, looking at a long term relationship, that scares me. Patient stated, bottom line is I'm lonely, but I'm not willing to pay any price, there's too many things I'm not willing to compromise on in reference to relationships. Patient stated, going to the  store is fine, its getting out and doing it in reference to activities and socialization. Patient stated, I don't know how I got to this point, the whole thing with Linzy (wife) dying threw me off. Patient stated, the moment she died it was like the past, the present, the future all crashed together that moment. Patient stated, the first 3 years I didn't feel this lost. Patient stated, today I'd like to sit at home and isolate. Patient stated, at night the silence just gets to me. Patient stated, to be able to maintain a positive relationship with my kids and grand kids, I would like to have a relationship with someone that is going to be lasting, to be able to go places with someone in reference to patients goals. Patient reported after wife's death patient adjusted to new responsibilities and spent a lot of time with friends. Patient stated, I had reached what I thought was acceptance, was determined Ive got to find a new life. Patient stated, good in response to current mood.   Interventions: Cognitive Behavioral Therapy. Clinician conducted session in person at clinician's office at [facility]. Reviewed events since last session and assessed for changes. Reviewed recent increase in activity level and socialization. Assisted patient in exploring and identifying barriers to attending artist display and additional opportunities for socialization. Explored patient's ambivalence regarding attending artist display. Discussed aspects important to patient in a relationship and boundaries.  Reviewed behavioral activation and patient's implementation. Continued to explore feelings of grief and stages of grief. Discussed patient's goals for the future. Explored strategies patient implemented to cope with wife's death.  Collaboration of Care: Other not required at this time   Diagnosis:  Adjustment disorder with mixed anxiety and depressed mood   Alcohol use disorder, moderate,  in sustained remission (HCC)     Plan: Patient is to utilize [employer], thought re-framing, relaxation techniques, mindfulness, behavioral activation, and coping strategies to decrease symptoms associated with their diagnosis. Frequency: bi-weekly  Modality: individual      Long-term goal:   Reduce overall level, frequency, and intensity of the feelings of depression and anxiety as evidenced by decrease in tremors, decreased concentration, obsessive thoughts, irritability, easily agitated, tingling in hands, feeling heat in extremities, restlessness, feeling on edge, fatigue, feelings unsettled, depressed mood, decreased appetite, loss of interest, low energy, decreased motivation, anxiety when in public settings and avoids going to stores from 2-3 times per month to 0 times per month per patient report for at least 3 consecutive months. Target Date: 08/11/25  Progress: progressing    Short-term goal:  Increase participation in positive activities, such as, getting up each day, develop a plan for each day, exercise, attending granddaughter's activities, attending social opportunities, kayaking, painting, family activities from 0 days per week to 7 days per week Target Date: 08/11/25  Progress: progressing    Increase participation in AA events per patient's report Target Date: 08/11/25  Progress: progressing    Practice coping strategies, such as, relaxation techniques, mindfulness exercises, meditation, grounding exercises 2-3 times per month in response to symptoms of anxiety  Target Date: 08/11/25  Progress: progressing    Identify, challenge, and replace negative thought patterns and negative self talk that contribute to feelings of depression and anxiety with positive thoughts, beliefs, and positive self talk per patient's report Target Date: 08/11/25  Progress: progressing    Being a healthy grieving process Target Date: 08/11/25  Progress: progressing    Develop  realistic expectations as it relates to relationships Target Date: 08/11/25  Progress: progressing      Darice Seats, LCSW

## 2024-10-13 DIAGNOSIS — C44719 Basal cell carcinoma of skin of left lower limb, including hip: Secondary | ICD-10-CM | POA: Diagnosis not present

## 2024-10-14 NOTE — Progress Notes (Signed)
 BH MD/PA/NP OP Progress Note  10/19/2024 12:15 PM Adrian Hart  MRN:  [id number]  Chief Complaint:  Chief Complaint  Patient presents with   Follow-up   HPI:  This is a follow-up appointment for panic disorder, depression, and anxiety.  He states that he has been doing better.  He has not had any panic attacks.  He took hydroxyzine  a few times as he was feeling restless. He was feeling that he needs to do something, although there is nothing he needs to do. However, his anxiety has been more manageable.  He reports feeling fuzzy, more distant since uptitration of sertraline .  He also reports sexual side effect.  He is open to medication adjustment.  He sleeps up to 7 hours and feels refreshed in the morning.  He continues to have appetite loss, and he eats 2 meals per day.  He denies SI, hallucinations. The patient has mood symptoms as in PHQ-9/GAD-7.  Noted that he has been drinking more coffee as he enjoys it.  He agrees to reduce the amount to avoid any possible reaction of worsening in anxiety.  He agrees with the plans as outlined below.   Originally- 200 lbs Wt Readings from Last 3 Encounters:  10/19/24 181 lb 6.4 oz (82.3 kg)  08/24/24 183 lb 6.4 oz (83.2 kg)  07/09/24 181 lb 12.8 oz (82.5 kg)     Substance use   Tobacco Alcohol Other substances/  Current   Abstinence for 38 years, denies craving Used marijuana, a few times since the last visit as it made him feel relaxed. Coffee- 3.5 cups of coffee   Marijuana for pain, since 2022/s/p knee surgery, it helps for anxiety, attention. 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 Panic disorder  F41.0     2. GAD (generalized anxiety disorder)  F41.1     3. Current mild episode of major depressive disorder without prior episode  F32.0       Past Psychiatric History: Please see initial evaluation for full details. I have reviewed the history. No updates at this time.     Past Medical History:  Past Medical History:  Diagnosis Date   Anxiety    Arthritis    COPD (chronic obstructive pulmonary disease) (HCC)    Coronary artery disease    GERD (gastroesophageal reflux disease)    PT TAKES APPLE CIDER VINEGAR    Hypertension    borderline pressures.  takes no meds   PONV (postoperative nausea and vomiting)    PONV x1 several years ago...also - urinarty retention after knee surgery (12/25/22)   Prostate cancer (HCC)     needs no treatment. still on observation   Thoracic aortic atherosclerosis     Past Surgical History:  Procedure Laterality Date   CATARACT EXTRACTION W/PHACO Left 01/07/2023   Procedure: CATARACT EXTRACTION PHACO AND INTRAOCULAR LENS PLACEMENT (IOC) LEFT 2.79 00:27.1;  Surgeon: Myrna Adine Anes, MD;  Location: [facility];  Service: Ophthalmology;  Laterality: Left;   COLONOSCOPY  2003   Dr Desiderio   COLONOSCOPY WITH PROPOFOL  N/A 05/02/2018   Procedure: COLONOSCOPY WITH PROPOFOL ;  Surgeon: Viktoria Lamar DASEN, MD;  Location: [facility];  Service: Endoscopy;  Laterality: N/A;   EYE SURGERY Right    cataract   FLEXIBLE SIGMOIDOSCOPY  1996   Dr Dessa   HEMORRHOID SURGERY  04-20-04   Dr Dessa   HERNIA REPAIR  09/27/2015   Umbilical, primary repair   HOLEP-LASER ENUCLEATION OF THE PROSTATE WITH MORCELLATION N/A 03/08/2023   Procedure: HOLEP-LASER ENUCLEATION OF THE PROSTATE WITH MORCELLATION;  Surgeon: Francisca Redell BROCKS, MD;  Location: [facility];  Service: Urology;  Laterality: N/A;   KNEE ARTHROSCOPY Right 12/20/2022   Procedure: RIGHT KNEE ARTHROSCOPY WITH  DEBRIDEMENT, PARTIAL LATERAL MENISCECTOMY, AND EXCISION OF LOOSE BODY RIGHT KNEE.;  Surgeon: Edie Norleen PARAS, MD;  Location: [facility];  Service: Orthopedics;  Laterality: Right;   KNEE ARTHROSCOPY Right 12/25/2022   Procedure: RIGHT KNEE ARTHROSCOPY WITH IRRIGATION AND DEBRIDEMENT.;  Surgeon: Edie Norleen PARAS, MD;  Location: [facility];  Service: Orthopedics;  Laterality: Right;   KNEE SURGERY Left 1995   arthroscopy   PARTIAL KNEE ARTHROPLASTY Right 09/05/2017   Procedure: UNICOMPARTMENTAL KNEE;  Surgeon: Edie Norleen PARAS, MD;  Location: [facility];  Service: Orthopedics;  Laterality: Right;   PARTIAL KNEE ARTHROPLASTY Left 09/01/2019   Procedure: UNICOMPARTMENTAL KNEE;  Surgeon: Edie Norleen PARAS, MD;  Location: [facility];  Service: Orthopedics;  Laterality: Left;   TONSILLECTOMY     UMBILICAL HERNIA REPAIR N/A 09/27/2015   Procedure: HERNIA REPAIR UMBILICAL ADULT;  Surgeon: Reyes LELON Dessa, MD;  Location: [facility];  Service: General;  Laterality: N/A;   XI ROBOTIC ASSISTED INGUINAL HERNIA REPAIR WITH MESH Left 07/28/2020   Procedure: XI ROBOTIC ASSISTED INGUINAL HERNIA REPAIR WITH MESH;  Surgeon: Jordis Laneta FALCON, MD;  Location: [facility];  Service: General;  Laterality: Left;    Family Psychiatric History: Please see initial evaluation for full details. I have reviewed the history. No updates at this time.     Family History:  Family History  Problem Relation Age of Onset   Diabetes Mother    Colon cancer Maternal Aunt    Prostate cancer Maternal Uncle    Bladder Cancer Neg Hx    Kidney cancer Neg Hx     Social History:  Social History   Socioeconomic History   Marital status: Widowed    Spouse name: Not on file   Number of children: 2   Years of education: Not on file   Highest education level: Bachelor's degree (e.g., BA, AB, BS)  Occupational History   Occupation: geophysical data processor    Comment: retired  Tobacco Use   Smoking status: Former    Current packs/day: 0.00    Average  packs/day: 0.8 packs/day for 40.0 years (30.0 ttl pk-yrs)    Types: Cigarettes, Cigars    Start date: 38    Quit date: 2009    Years since quitting: 16.8    Passive exposure: Past   Smokeless tobacco: Never  Vaping Use   Vaping status: Former  Substance and Sexual Activity   Alcohol use: No    Alcohol/week: 0.0 standard drinks of alcohol   Drug use: Yes    Types: Marijuana   Sexual activity: Yes  Other Topics Concern   Not on file  Social History Narrative   Lives alone but patient will stay with daughter [name] for a few days after surgery  Social Drivers of Corporate Investment Banker Strain: Low Risk  (08/05/2024)   Received from [facility]   Overall Financial Resource Strain (CARDIA)    Difficulty of Paying Living Expenses: Not hard at all  Food Insecurity: No Food Insecurity (08/05/2024)   Received from [facility]   Hunger Vital Sign    Within the past 12 months, you worried that your food would run out before you got the money to buy more.: Never true    Within the past 12 months, the food you bought just didn't last and you didn't have money to get more.: Never true  Transportation Needs: No Transportation Needs (08/05/2024)   Received from [facility] - Transportation    In the past 12 months, has lack of transportation kept you from medical appointments or from getting medications?: No    Lack of Transportation (Non-Medical): No  Physical Activity: Not on file  Stress: Not on file  Social Connections: Not on file    Allergies:  Allergies  Allergen Reactions   Fish Protein-Containing Drug Products Nausea And Vomiting    Reaction to mussells only    Metabolic Disorder Labs: No results found for: HGBA1C, MPG No results found for: PROLACTIN No results found for: CHOL, TRIG, HDL, CHOLHDL, VLDL, LDLCALC Lab Results  Component Value Date   TSH 0.769 06/04/2024    Therapeutic  Level Labs: No results found for: LITHIUM No results found for: VALPROATE No results found for: CBMZ  Current Medications: Current Outpatient Medications  Medication Sig Dispense Refill   fluticasone-salmeterol (ADVAIR) 100-50 MCG/ACT AEPB Inhale 1 puff into the lungs.     hydrOXYzine  (ATARAX ) 25 MG tablet Take 1 tablet (25 mg total) by mouth daily as needed for anxiety. 90 tablet 0   losartan-hydrochlorothiazide (HYZAAR) 100-12.5 MG tablet Take 1 tablet by mouth daily.     Maca Root (MACA PO) Take 1 capsule by mouth daily.     Multiple Vitamin (MULTI-VITAMINS) TABS Take 1 tablet by mouth daily.      Multiple Vitamins-Minerals (ZINC PO) Take 1 tablet by mouth daily.     omeprazole (PRILOSEC) 40 MG capsule Take 40 mg by mouth daily.     PSYLLIUM PO Take 3 capsules by mouth 2 (two) times daily.      rosuvastatin (CRESTOR) 10 MG tablet Take 10 mg by mouth daily.     tadalafil  (CIALIS ) 20 MG tablet Take 1 tablet (20 mg total) by mouth daily as needed for erectile dysfunction. 30 tablet 0   tadalafil  (CIALIS ) 5 MG tablet Take 1 tablet (5 mg total) by mouth daily as needed for erectile dysfunction. 90 tablet 3   traZODone  (DESYREL ) 50 MG tablet Take 25 mg by mouth at bedtime as needed for sleep.     Vilazodone HCl (VIIBRYD) 10 MG TABS 5 mg daily for one week, then 10 mg daily for one week 11 tablet 0   [START ON 11/02/2024] Vilazodone HCl 20 MG TABS Take 1 tablet (20 mg total) by mouth daily. Start after taking 10 mg daily for one week 30 tablet 1   [START ON 11/21/2024] prazosin  (MINIPRESS ) 1 MG capsule Take 1 capsule (1 mg total) by mouth at bedtime. 90 capsule 0   sertraline  (ZOLOFT ) 100 MG tablet Take 1 tablet (100 mg total) by mouth daily. (Patient not taking: Reported on 10/19/2024) 30 tablet 0   No current facility-administered medications for this visit.  Musculoskeletal: Strength & Muscle Tone: within normal limits Gait & Station: normal Patient leans: N/A  Psychiatric  Specialty Exam: Review of Systems  Psychiatric/Behavioral:  Positive for decreased concentration. Negative for agitation, behavioral problems, confusion, dysphoric mood, hallucinations, self-injury, sleep disturbance and suicidal ideas. The patient is nervous/anxious. The patient is not hyperactive.   All other systems reviewed and are negative.   Blood pressure (!) 147/84, pulse 84, temperature (!) 97.3 F (36.3 C), temperature source Temporal, height 5' 11 (1.803 m), weight 181 lb 6.4 oz (82.3 kg).Body mass index is 25.3 kg/m.  General Appearance: Well Groomed  Eye Contact:  Good  Speech:  Clear and Coherent  Volume:  Normal  Mood:  better  Affect:  Appropriate, Congruent, and calm  Thought Process:  Coherent  Orientation:  Full (Time, Place, and Person)  Thought Content: Logical   Suicidal Thoughts:  No  Homicidal Thoughts:  No  Memory:  Immediate;   Good  Judgement:  Good  Insight:  Good  Psychomotor Activity:  Normal  Concentration:  Concentration: Good and Attention Span: Good  Recall:  Good  Fund of Knowledge: Good  Language: Good  Akathisia:  No  Handed:  Right  AIMS (if indicated): not done  Assets:  Communication Skills Desire for Improvement  ADL's:  Intact  Cognition: WNL  Sleep:  Good   Screenings: GAD-7    Flowsheet Row Office Visit from 10/19/2024 in [facility] Office Visit from 07/09/2024 in [facility]  Total GAD-7 Score 2 9   PHQ2-9    Flowsheet Row Office Visit from 10/19/2024 in [facility] Office Visit from 07/09/2024 in [facility] Office Visit from 07/02/2024 in [facility]  PHQ-2 Total Score 1 2 5   PHQ-9 Total Score -- 8 19   Flowsheet Row Office Visit from 06/04/2024 in [facility] Admission (Discharged)  from 03/08/2023 in [facility] Pre-Admission Testing 46 from 02/28/2023 in [facility] PRE [facility]MISS[facility] TEST[facility]  C-SSRS RISK CATEGORY No Risk No Risk No Risk     Assessment and Plan:  Adrian Hart is a 73 y.o. year old male with a history of anxiety, alcohol use in sustained remission, CAD, GERD, T1c low risk prostate cancer, BPH s/p HoLEP, who presents for follow up appointment for below.   1. Panic disorder 2. GAD (generalized anxiety disorder) 3. Current mild episode of major depressive disorder without prior episode # r/o PTSD The patient has a history of alcohol use for insomnia but is currently in sobriety and has been using marijuana over the past few years. His mother has a history of mental health issues. Psychologically, he describes his mother as very critical, which contributed to persistent feelings of inadequacy and a belief that he was never good enough. 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 He is currently dealing with ongoing fatigue following a COVID infection, has undergone four surgeries, and is managing multiple health issues, which represents a significant change from his previously good physical condition. History: never seen by psychiatrist. Originally on lexapro 10 mg daily (started to be adherent in the last month)     Although he reports overall improvement in anxiety and panic attacks since uptitration of sertraline , there is  a concern of drowsiness (feeling distant) since uptitration of sertraline .  He also reports sexual side effect from this medication.  Will cross-taper to Viibryd to see if it is effective without these adverse reaction.  Discussed potential risk of nausea, serotonin syndrome.  He was advised to contact the office if any worsening in his symptoms.  Will continue the  current dose of prazosin  to target vivid dreams, hyperarousal symptoms related to PTSD given he has significant benefit from this medication.  Discussed potential risk of orthostatic hypotension.  Will continue hydroxyzine  as needed for anxiety. He will continue to see Ms. Sharpe for therapy.    # r/o cognitive impairment - TSH wnl 06/2024 , folate, vitamin B 12 wnl 07/2024 He reports occasional episodes of confusion, and reports difficulty in attention.  Given the etiology could be multifactorial, will prioritize the treatment for mood symptoms as outlined above. We will plan to assess with [facility] after his mood improves for further assessment.    Plan Decrease sertraline  50 mg at night for one week, then discontinue  Start viibryd 5 mg daily for one week, then 10 mg daily for one week, then increase to 20 mg daily  Continue prazosin  1 mg at night  Continue hydroxyzine  25 mg daily as needed for anxiety - he declined a refill Next appointment- 1/13 at 11 am, IP  Past trials of medication: lexapro, buspar   The patient demonstrates the following risk factors for suicide: Chronic risk factors for suicide include: psychiatric disorder of anxiety,  and substance use disorder. Acute risk factors for suicide include: loss (financial, interpersonal, professional). Protective factors for this patient include: positive social support, coping skills, and hope for the future. Considering these factors, the overall suicide risk at this point appears to be low. Patient is appropriate for outpatient follow up.     Collaboration of Care: Collaboration of Care: Other reviewed notes in Epic  Patient/Guardian was advised Release of Information must be obtained prior to any record release in order to collaborate their care with an outside provider. Patient/Guardian was advised if they have not already done so to contact the registration department to sign all necessary forms in order for us  to release information  regarding their care.   Consent: Patient/Guardian gives verbal consent for treatment and assignment of benefits for services provided during this visit. Patient/Guardian expressed understanding and agreed to proceed.    Katheren Sleet, MD 10/19/2024, 12:15 PM

## 2024-10-15 ENCOUNTER — Ambulatory Visit
Admission: RE | Admit: 2024-10-15 | Discharge: 2024-10-15 | Disposition: A | Discharge status: Home / Self Care | Source: Ambulatory Visit | Attending: Internal Medicine | Admitting: Internal Medicine

## 2024-10-15 DIAGNOSIS — R911 Solitary pulmonary nodule: Secondary | ICD-10-CM | POA: Insufficient documentation

## 2024-10-15 DIAGNOSIS — C61 Malignant neoplasm of prostate: Secondary | ICD-10-CM | POA: Diagnosis not present

## 2024-10-15 DIAGNOSIS — R079 Chest pain, unspecified: Secondary | ICD-10-CM | POA: Diagnosis not present

## 2024-10-15 DIAGNOSIS — R918 Other nonspecific abnormal finding of lung field: Secondary | ICD-10-CM | POA: Diagnosis not present

## 2024-10-19 ENCOUNTER — Ambulatory Visit (INDEPENDENT_AMBULATORY_CARE_PROVIDER_SITE_OTHER): Admitting: Psychiatry

## 2024-10-19 ENCOUNTER — Encounter: Payer: Self-pay | Admitting: Psychiatry

## 2024-10-19 ENCOUNTER — Other Ambulatory Visit: Payer: Self-pay

## 2024-10-19 VITALS — BP 147/84 | HR 84 | Temp 97.3°F | Ht 71.0 in | Wt 181.4 lb

## 2024-10-19 DIAGNOSIS — F320 Major depressive disorder, single episode, mild: Secondary | ICD-10-CM

## 2024-10-19 DIAGNOSIS — F411 Generalized anxiety disorder: Secondary | ICD-10-CM | POA: Diagnosis not present

## 2024-10-19 DIAGNOSIS — F410 Panic disorder [episodic paroxysmal anxiety] without agoraphobia: Secondary | ICD-10-CM | POA: Diagnosis not present

## 2024-10-19 MED ORDER — PRAZOSIN HCL 1 MG PO CAPS: 1.0000 mg | ORAL_CAPSULE | Freq: Every day | ORAL | 0 refills | Status: AC

## 2024-10-19 MED ORDER — VILAZODONE HCL 10 MG PO TABS: ORAL_TABLET | ORAL | 0 refills | Status: DC

## 2024-10-19 MED ORDER — VILAZODONE HCL 20 MG PO TABS: 20.0000 mg | ORAL_TABLET | Freq: Every day | ORAL | 1 refills | Status: DC

## 2024-10-19 NOTE — Patient Instructions (Signed)
 Decrease sertraline  50 mg at night for one week, then discontinue  Start viibryd 5 mg daily for one week, then 10 mg daily for one week, then increase to 20 mg daily  Continue prazosin  1 mg at night  Continue hydroxyzine  25 mg daily as needed for anxiety  Next appointment- 1/13 at 11 am

## 2024-10-23 NOTE — Progress Notes (Signed)
 COLYN MIRON                                          MRN: [id number]   10/23/2024   The [facility] Quality Team Specialist reviewed this patient medical record for the purposes of chart review for care gap closure. The following were reviewed: chart review for care gap closure-controlling blood pressure.    [facility]BCI Quality Team

## 2024-10-26 ENCOUNTER — Telehealth: Payer: Self-pay

## 2024-10-26 NOTE — Telephone Encounter (Signed)
 Understood. Please advise him to consider fluoxetine if he has not yet tried it. It generally causes minimal drowsiness, though sexual side effects remain possible. With his blood pressure being on the higher side, the choices are more limited, so this option may be worth exploring.

## 2024-10-26 NOTE — Telephone Encounter (Signed)
 Hello,   Pt called today stating that his Vilazodone  HCl (VIIBRYD ) 10 MG TABS were too expensive to pay out of pocket and wanted to speak with provider about switching options on mediations in this class. Noted to patient's pharmacist  that I cannot advise on stopping or taking mediations, because pt also left a message to pharmacy @ [facility] on file as well. Please advise.   JNL

## 2024-10-26 NOTE — Telephone Encounter (Signed)
 Reached out to patient and gave him options between [employer] RX coupons and others that could research. Patient was told about fluoxetine and management between his Blood pressure along with complications that can be caused by medications if were to change- Sex changes, etc as mentioned below. Pt states that he will return a phone call to provider Monday of next week as, he needs some time to consider these options.   JNL

## 2024-10-26 NOTE — Telephone Encounter (Signed)
 Noted, thanks!

## 2024-10-28 ENCOUNTER — Ambulatory Visit: Admitting: Clinical

## 2024-10-28 DIAGNOSIS — F1021 Alcohol dependence, in remission: Secondary | ICD-10-CM | POA: Diagnosis not present

## 2024-10-28 DIAGNOSIS — F4323 Adjustment disorder with mixed anxiety and depressed mood: Secondary | ICD-10-CM

## 2024-10-28 NOTE — Progress Notes (Signed)
 [facility] Counselor/Therapist Progress Note  Patient ID: Adrian Hart, MRN: [id number],    Date: 10/28/2024  Time Spent: 1:35pm - 2:27pm : 52 minutes   Treatment Type: Individual Therapy  Reported Symptoms: cold sweats, hands feel cold, weight loss, fatigue  Mental Status Exam: Appearance:  Neat and Well Groomed     Behavior: Appropriate  Motor: Normal  Speech/Language:  Clear and Coherent and Normal Rate  Affect: Appropriate  Mood: normal  Thought process: normal  Thought content:   WNL  Sensory/Perceptual disturbances:   WNL  Orientation: oriented to person, place, time/date, and situation  Attention: Good  Concentration: Good  Memory: WNL  Fund of knowledge:  Good  Insight:   Good  Judgment:  Good  Impulse Control: Good   Risk Assessment: Danger to Self:  No Patient denied current suicidal ideation  Self-injurious Behavior: No Danger to Others: No Patient denied current homicidal ideation Duty to Warn:no Physical Aggression / Violence:No  Access to Firearms a concern: No  Gang Involvement:No   Subjective:  Patient stated, good, seems to be good, I have mostly good days but once in a while I have physical symptoms in response to events since last session. Patient reported cold sweats, hands feel cold, weight loss.  Patient stated, I was thinking about quitting any medications. Patient reported fatigue and reported feeling fuzzy in the afternoons. Patient stated, in the afternoons I don't feel like myself. Patient reported an upcoming appointment with PCP. Patient reported patient observed a change in symptoms when sertraline  was increased to 100 mg. Patient reported patient's home has mold and reported concern that symptoms are related to mold toxicity. Patient reported mold in the basement and on windows in the home. Patient reported improvement in symptoms when patient leaves the home. Patient reported no recent depressive symptoms and  reported very little if I have it in reference to symptoms of anxiety.  Patient stated, physically I'm feeling good today, mood is good. Patient reported relationship with significant other is going well and significant other is joining patient/family for thanksgiving meal. Patient reported significant other will be meeting patient's children for this first time. Patient stated, I'm feeling hopeful in reference to family gathering. Patient reported feeling 15 year sobriety has impacted current relationship and stated, its a whole different kind of relationship, its honest. Patient reported feelings of guilt in regard to having feelings for another individual. Patient reported patient is considering painting again and went fishing once.   Interventions: Cognitive Behavioral Therapy. Clinician conducted session in person at clinician's office at [facility]. Reviewed events since last session and assessed for changes. Discussed patient's concerns related to symptoms, medications, and living environment. Discussed patient following up with PCP to discuss symptoms. Discussed status of patient's relationship and patient's thoughts/feelings regarding significant other attending patient's family's holiday gathering. Discussed differences in patient's marriage/current relationship and impact of substance use. Provided psycho education related to anxiety.  Provided psycho education related to mood and benefit of positive activities. Reviewed positive activities.   Collaboration of Care: Other not required at this time   Diagnosis:  Adjustment disorder with mixed anxiety and depressed mood   Alcohol use disorder, moderate, in sustained remission (HCC)     Plan: Patient is to utilize [employer] Therapy, thought re-framing, relaxation techniques, mindfulness, behavioral activation, and coping strategies to decrease symptoms associated with their diagnosis. Frequency: bi-weekly  Modality:  individual      Long-term goal:   Reduce overall level, frequency,  and intensity of the feelings of depression and anxiety as evidenced by decrease in tremors, decreased concentration, obsessive thoughts, irritability, easily agitated, tingling in hands, feeling heat in extremities, restlessness, feeling on edge, fatigue, feelings unsettled, depressed mood, decreased appetite, loss of interest, low energy, decreased motivation, anxiety when in public settings and avoids going to stores from 2-3 times per month to 0 times per month per patient report for at least 3 consecutive months. Target Date: 08/11/25  Progress: progressing    Short-term goal:  Increase participation in positive activities, such as, getting up each day, develop a plan for each day, exercise, attending granddaughter's activities, attending social opportunities, kayaking, painting, family activities from 0 days per week to 7 days per week Target Date: 08/11/25  Progress: progressing    Increase participation in AA events per patient's report Target Date: 08/11/25  Progress: progressing    Practice coping strategies, such as, relaxation techniques, mindfulness exercises, meditation, grounding exercises 2-3 times per month in response to symptoms of anxiety  Target Date: 08/11/25  Progress: progressing    Identify, challenge, and replace negative thought patterns and negative self talk that contribute to feelings of depression and anxiety with positive thoughts, beliefs, and positive self talk per patient's report Target Date: 08/11/25  Progress: progressing    Being a healthy grieving process Target Date: 08/11/25  Progress: progressing    Develop realistic expectations as it relates to relationships Target Date: 08/11/25  Progress: progressing       Darice Seats, LCSW

## 2024-10-28 NOTE — Progress Notes (Signed)
   Darice Seats, LCSW

## 2024-11-02 ENCOUNTER — Other Ambulatory Visit: Payer: Self-pay | Admitting: Urology

## 2024-11-02 DIAGNOSIS — R339 Retention of urine, unspecified: Secondary | ICD-10-CM

## 2024-11-05 ENCOUNTER — Telehealth: Payer: Self-pay

## 2024-11-05 NOTE — Telephone Encounter (Signed)
 I previously advised considering fluoxetine. Could you confirm whether he would prefer to resume sertraline  instead? Thanks.

## 2024-11-05 NOTE — Telephone Encounter (Signed)
 Patient called stating the Vilazodone  was too expensive and he is requesting a refill of the sertraline  (ZOLOFT ) 100 MG tablet (Expired)   Please advise

## 2024-11-09 NOTE — Telephone Encounter (Signed)
 Have made two attempts to reach patient no answer left voicemail for patient to return call to office

## 2024-11-10 DIAGNOSIS — Z01818 Encounter for other preprocedural examination: Secondary | ICD-10-CM | POA: Diagnosis not present

## 2024-11-19 ENCOUNTER — Ambulatory Visit: Admitting: Clinical

## 2024-11-19 DIAGNOSIS — F1021 Alcohol dependence, in remission: Secondary | ICD-10-CM

## 2024-11-19 DIAGNOSIS — F4323 Adjustment disorder with mixed anxiety and depressed mood: Secondary | ICD-10-CM | POA: Diagnosis not present

## 2024-11-19 NOTE — Progress Notes (Signed)
 [facility] Counselor/Therapist Progress Note  Patient ID: Adrian Hart, MRN: [id number],    Date: 11/19/2024  Time Spent: 12:35pm - 1:33pm : 58 minutes   Treatment Type: Individual Therapy  Reported Symptoms: none reported  Mental Status Exam: Appearance:  Neat and Well Groomed     Behavior: Appropriate  Motor: Normal  Speech/Language:  Clear and Coherent and Normal Rate  Affect: Appropriate  Mood: normal  Thought process: normal  Thought content:   WNL  Sensory/Perceptual disturbances:   WNL  Orientation: oriented to person, place, time/date, and situation  Attention: Good  Concentration: Good  Memory: WNL  Fund of knowledge:  Good  Insight:   Good  Judgment:  Good  Impulse Control: Good   Risk Assessment: Danger to Self:  No Patient denied current suicidal ideation  Self-injurious Behavior: No Danger to Others: No Patient denied current homicidal ideation Duty to Warn:no Physical Aggression / Violence:No  Access to Firearms a concern: No  Gang Involvement:No   Subjective: Patient stated, it was good in reference to recent family gathering and stated, a lots happened in response to events since last session. Patient reported patient's significant other attended thanksgiving meal with patient's family and patient reported family gathering went well. Patient reported after the thanksgiving holiday significant other stopped communicating with patient. Patient stated, for a few days she goes silent and stated, I was upset about it. Patient stated, I don't get it in reference to significant other's communication style. Patient stated, communication is an issue without a doubt. Patient reported communication with significant other fluctuates. Patient stated, my gut feeling and instinct is she's not ready to talk about that in reference to significant other's communication style. Patient reported feeling significant other would perceive a discussion  regarding communication as a confrontation. Patient reported confusion in response to recent interactions with significant other. Patient stated, I'm not going to overly pursue it in reference to the relationship. Patient reported significant other's responses are impacting patient.  Patient stated, even though I feel those feelings I don't stay in those feelings. Patient reported concerns related to interactions with others regarding politics and stated, I'm sick of it. Patient reported patient has been out of Zoloft  for 10 days and stated, I feel better. Patient reported a follow up appointment with psychiatrist in January.   Interventions: Cognitive Behavioral Therapy and Interpersonal. Clinician conducted session in person at clinician's office at [facility]. Reviewed events since last session and assessed for changes. Discussed communication with significant other and patient's concerns related to relationship with significant other. Explored and identified thoughts and feelings triggered by communication with significant other. Explored barriers to patient vocalizing patient's feelings to significant other. Provided psycho education related to use of I statements. Provided therapeutic space for patient to vocalize patient's thoughts/feelings regarding recent interactions with others. Discussed patient following up with psychiatrist regarding medication.   Collaboration of Care: Other not required at this time   Diagnosis:  Adjustment disorder with mixed anxiety and depressed mood   Alcohol use disorder, moderate, in sustained remission (HCC)     Plan: Patient is to utilize [employer] Therapy, thought re-framing, relaxation techniques, mindfulness, behavioral activation, and coping strategies to decrease symptoms associated with their diagnosis. Frequency: bi-weekly  Modality: individual      Long-term goal:   Reduce overall level, frequency, and intensity of the  feelings of depression and anxiety as evidenced by decrease in tremors, decreased concentration, obsessive thoughts, irritability, easily agitated, tingling in hands,  feeling heat in extremities, restlessness, feeling on edge, fatigue, feelings unsettled, depressed mood, decreased appetite, loss of interest, low energy, decreased motivation, anxiety when in public settings and avoids going to stores from 2-3 times per month to 0 times per month per patient report for at least 3 consecutive months. Target Date: 08/11/25  Progress: progressing    Short-term goal:  Increase participation in positive activities, such as, getting up each day, develop a plan for each day, exercise, attending granddaughter's activities, attending social opportunities, kayaking, painting, family activities from 0 days per week to 7 days per week Target Date: 08/11/25  Progress: progressing    Increase participation in AA events per patient's report Target Date: 08/11/25  Progress: progressing    Practice coping strategies, such as, relaxation techniques, mindfulness exercises, meditation, grounding exercises 2-3 times per month in response to symptoms of anxiety  Target Date: 08/11/25  Progress: progressing    Identify, challenge, and replace negative thought patterns and negative self talk that contribute to feelings of depression and anxiety with positive thoughts, beliefs, and positive self talk per patient's report Target Date: 08/11/25  Progress: progressing    Being a healthy grieving process Target Date: 08/11/25  Progress: progressing    Develop realistic expectations as it relates to relationships Target Date: 08/11/25  Progress: progressing       Darice Seats, LCSW

## 2024-11-19 NOTE — Progress Notes (Signed)
   Adrian Seats, LCSW

## 2024-12-08 ENCOUNTER — Other Ambulatory Visit

## 2024-12-08 DIAGNOSIS — N401 Enlarged prostate with lower urinary tract symptoms: Secondary | ICD-10-CM

## 2024-12-09 ENCOUNTER — Ambulatory Visit: Payer: Self-pay | Admitting: Urology

## 2024-12-09 ENCOUNTER — Other Ambulatory Visit

## 2024-12-09 LAB — PSA: Prostate Specific Ag, Serum: 3.4 ng/mL (ref 0.0–4.0)

## 2024-12-10 NOTE — Progress Notes (Unsigned)
 BH MD/PA/NP OP Progress Note  12/10/2024 12:43 PM Adrian Hart  MRN:  [id number]  Chief Complaint: No chief complaint on file.  HPI: *** Vilazodone  expensive,  May consider fluoxetine    Substance use   Tobacco Alcohol Other substances/  Current   Abstinence for 38 years, denies craving Used marijuana, a few times since the last visit as it made him feel relaxed. Coffee- 3.5 cups of coffee   Marijuana for pain, since 2022/s/p knee surgery, it helps for anxiety, attention. 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  Past Psychiatric History: Please see initial evaluation for full details. I have reviewed the history. No updates at this time.     Past Medical History:  Past Medical History:  Diagnosis Date   Anxiety    Arthritis    COPD (chronic obstructive pulmonary disease) (HCC)    Coronary artery disease    GERD (gastroesophageal reflux disease)    PT TAKES APPLE CIDER VINEGAR    Hypertension    borderline pressures.  takes no meds   PONV (postoperative nausea and vomiting)    PONV x1 several years ago...also - urinarty retention after knee surgery (12/25/22)   Prostate cancer (HCC)     needs no treatment. still on observation   Thoracic aortic atherosclerosis     Past Surgical History:  Procedure Laterality Date   CATARACT EXTRACTION W/PHACO Left 01/07/2023   Procedure: CATARACT EXTRACTION PHACO AND INTRAOCULAR LENS PLACEMENT (IOC) LEFT 2.79 00:27.1;  Surgeon: Myrna Adine Anes, MD;  Location: [facility];  Service: Ophthalmology;  Laterality:  Left;   COLONOSCOPY  2003   Dr Desiderio   COLONOSCOPY WITH PROPOFOL  N/A 05/02/2018   Procedure: COLONOSCOPY WITH PROPOFOL ;  Surgeon: Viktoria Lamar DASEN, MD;  Location: [facility];  Service: Endoscopy;  Laterality: N/A;   EYE SURGERY Right    cataract   FLEXIBLE SIGMOIDOSCOPY  1996   Dr Dessa   HEMORRHOID SURGERY  04-20-04   Dr Dessa   HERNIA REPAIR  09/27/2015   Umbilical, primary repair   HOLEP-LASER ENUCLEATION OF THE PROSTATE WITH MORCELLATION N/A 03/08/2023   Procedure: HOLEP-LASER ENUCLEATION OF THE PROSTATE WITH MORCELLATION;  Surgeon: Francisca Redell JAYSON, MD;  Location: [facility];  Service: Urology;  Laterality: N/A;   KNEE ARTHROSCOPY Right 12/20/2022   Procedure: RIGHT KNEE ARTHROSCOPY WITH DEBRIDEMENT, PARTIAL LATERAL MENISCECTOMY, AND EXCISION OF LOOSE BODY RIGHT KNEE.;  Surgeon: Edie Norleen PARAS, MD;  Location: [facility];  Service: Orthopedics;  Laterality: Right;   KNEE ARTHROSCOPY Right 12/25/2022   Procedure: RIGHT KNEE ARTHROSCOPY WITH IRRIGATION AND DEBRIDEMENT.;  Surgeon: Edie Norleen PARAS, MD;  Location: [facility];  Service: Orthopedics;  Laterality: Right;   KNEE SURGERY Left 1995   arthroscopy   PARTIAL KNEE ARTHROPLASTY Right 09/05/2017   Procedure: UNICOMPARTMENTAL KNEE;  Surgeon: Edie Norleen PARAS, MD;  Location: [facility];  Service: Orthopedics;  Laterality: Right;   PARTIAL KNEE ARTHROPLASTY Left 09/01/2019   Procedure: UNICOMPARTMENTAL KNEE;  Surgeon: Edie Norleen PARAS, MD;  Location: [facility];  Service: Orthopedics;  Laterality: Left;   TONSILLECTOMY     UMBILICAL HERNIA REPAIR N/A 09/27/2015   Procedure: HERNIA REPAIR UMBILICAL ADULT;  Surgeon: Reyes LELON Cota, MD;  Location: [facility];  Service: General;  Laterality: N/A;   XI ROBOTIC ASSISTED INGUINAL HERNIA REPAIR WITH MESH Left 07/28/2020   Procedure: XI ROBOTIC ASSISTED INGUINAL HERNIA REPAIR WITH MESH;  Surgeon: Jordis Laneta FALCON, MD;  Location: [facility];  Service: General;  Laterality: Left;    Family Psychiatric History: Please  see initial evaluation for full details. I have reviewed the history. No updates at this time.     Family History:  Family History  Problem Relation Age of Onset   Diabetes Mother    Colon cancer Maternal Aunt    Prostate cancer Maternal Uncle    Bladder Cancer Neg Hx    Kidney cancer Neg Hx     Social History:  Social History   Socioeconomic History   Marital status: Widowed    Spouse name: Not on file   Number of children: 2   Years of education: Not on file   Highest education level: Bachelor's degree (e.g., BA, AB, BS)  Occupational History   Occupation: geophysical data processor    Comment: retired  Tobacco Use   Smoking status: Former    Current packs/day: 0.00    Average packs/day: 0.8 packs/day for 40.0 years (30.0 ttl pk-yrs)    Types: Cigarettes, Cigars    Start date: 77    Quit date: 2009    Years since quitting: 17.0    Passive exposure: Past   Smokeless tobacco: Never  Vaping Use   Vaping status: Former  Substance and Sexual Activity   Alcohol use: No    Alcohol/week: 0.0 standard drinks of alcohol   Drug use: Yes    Types: Marijuana   Sexual activity: Yes  Other Topics Concern   Not on file  Social History Narrative   Lives alone but patient will stay with daughter Adrian Hart for a few days after surgery   Social Drivers of Health   Tobacco Use: Medium Risk (10/19/2024)   Patient History    Smoking Tobacco Use: Former    Smokeless Tobacco Use: Never    Passive Exposure: Past  Physicist, Medical Strain: Low Risk  (08/05/2024)   Received from [facility]   Overall Financial Resource Strain (CARDIA)    Difficulty of Paying Living Expenses: Not hard at all  Food Insecurity: No Food Insecurity (08/05/2024)   Received from [facility]   Epic    Within the past 12 months, you worried that your food would run out before you got the money to buy more.: Never true    Within the past 12 months, the food you bought just  didn't last and you didn't have money to get more.: Never true  Transportation Needs: No Transportation Needs (08/05/2024)   Received from [facility] - Transportation    In the past 12 months, has lack of transportation kept you from medical appointments or from getting medications?: No    Lack of Transportation (Non-Medical): No  Physical Activity: Not on file  Stress: Not on file  Social Connections: Not on file  Depression (EYV7-0): Low Risk (10/19/2024)   Depression (PHQ2-9)    PHQ-2 Score: 1  Alcohol Screen: Not on file  Housing: Low Risk  (08/05/2024)   Received from [facility]  Epic    In the last 12 months, was there a time when you were not able to pay the mortgage or rent on time?: No    In the past 12 months, how many times have you moved where you were living?: 0    At any time in the past 12 months, were you homeless or living in a shelter (including now)?: No  Utilities: Not At Risk (08/05/2024)   Received from [facility]   Epic    In the past 12 months has the electric, gas, oil, or water  company threatened to shut off services in your home?: No  Health Literacy: Not on file    Allergies: Allergies[1]  Metabolic Disorder Labs: No results found for: HGBA1C, MPG No results found for: PROLACTIN No results found for: CHOL, TRIG, HDL, CHOLHDL, VLDL, LDLCALC Lab Results  Component Value Date   TSH 0.769 06/04/2024    Therapeutic Level Labs: No results found for: LITHIUM No results found for: VALPROATE No results found for: CBMZ  Current Medications: Current Outpatient Medications  Medication Sig Dispense Refill   fluticasone-salmeterol (ADVAIR) 100-50 MCG/ACT AEPB Inhale 1 puff into the lungs.     losartan-hydrochlorothiazide (HYZAAR) 100-12.5 MG tablet Take 1 tablet by mouth daily.     Maca Root (MACA PO) Take 1 capsule by mouth daily.     Multiple Vitamin (MULTI-VITAMINS)  TABS Take 1 tablet by mouth daily.      Multiple Vitamins-Minerals (ZINC PO) Take 1 tablet by mouth daily.     omeprazole (PRILOSEC) 40 MG capsule Take 40 mg by mouth daily.     prazosin  (MINIPRESS ) 1 MG capsule Take 1 capsule (1 mg total) by mouth at bedtime. 90 capsule 0   PSYLLIUM PO Take 3 capsules by mouth 2 (two) times daily.      rosuvastatin (CRESTOR) 10 MG tablet Take 10 mg by mouth daily.     sertraline  (ZOLOFT ) 100 MG tablet Take 1 tablet (100 mg total) by mouth daily. (Patient not taking: Reported on 10/19/2024) 30 tablet 0   tadalafil  (CIALIS ) 20 MG tablet Take 1 tablet (20 mg total) by mouth daily as needed for erectile dysfunction. 30 tablet 0   tadalafil  (CIALIS ) 5 MG tablet TAKE 1 TABLET BY MOUTH DAILY AS NEEDED FOR ERECTILE DYSFUNCTION 90 tablet 3   traZODone  (DESYREL ) 50 MG tablet Take 25 mg by mouth at bedtime as needed for sleep.     Vilazodone  HCl (VIIBRYD ) 10 MG TABS 5 mg daily for one week, then 10 mg daily for one week 11 tablet 0   Vilazodone  HCl 20 MG TABS Take 1 tablet (20 mg total) by mouth daily. Start after taking 10 mg daily for one week 30 tablet 1   No current facility-administered medications for this visit.     Musculoskeletal: Strength & Muscle Tone: within normal limits Gait & Station: normal Patient leans: N/A  Psychiatric Specialty Exam: Review of Systems  There were no vitals taken for this visit.There is no height or weight on file to calculate BMI.  General Appearance: {Appearance:22683}  Eye Contact:  {BHH EYE CONTACT:22684}  Speech:  Clear and Coherent  Volume:  Normal  Mood:  {BHH MOOD:22306}  Affect:  {Affect (PAA):22687}  Thought Process:  Coherent  Orientation:  Full (Time, Place, and Person)  Thought Content: Logical   Suicidal Thoughts:  {ST/HT (PAA):22692}  Homicidal Thoughts:  {ST/HT (PAA):22692}  Memory:  Immediate;   Good  Judgement:  {Judgement (PAA):22694}  Insight:  {  Insight (PAA):22695}  Psychomotor Activity:  Normal   Concentration:  Concentration: Good and Attention Span: Good  Recall:  Good  Fund of Knowledge: Good  Language: Good  Akathisia:  No  Handed:  Right  AIMS (if indicated): not done  Assets:  Communication Skills Desire for Improvement  ADL's:  Intact  Cognition: WNL  Sleep:  {BHH GOOD/FAIR/POOR:22877}   Screenings: GAD-7    Flowsheet Row Office Visit from 10/19/2024 in [facility] Office Visit from 07/09/2024 in [facility]  Total GAD-7 Score 2 9   PHQ2-9    Flowsheet Row Office Visit from 10/19/2024 in [facility] Office Visit from 07/09/2024 in [facility] Office Visit from 07/02/2024 in [facility]  PHQ-2 Total Score 1 2 5   PHQ-9 Total Score -- 8 19   Flowsheet Row Office Visit from 06/04/2024 in [facility] Admission (Discharged) from 03/08/2023 in [facility] Pre-Admission Testing 46 from 02/28/2023 in [facility] PRE [facility]MISSION TEST[facility]  C-SSRS RISK CATEGORY No Risk No Risk No Risk     Assessment and Plan:  Adrian Hart is a 74 y.o. year old male with a history of anxiety, alcohol use in sustained remission, CAD, GERD, T1c low risk prostate cancer, BPH s/p HoLEP, who presents for follow up appointment for below.    1. Panic disorder 2. GAD (generalized anxiety disorder) 3. Current mild episode of major depressive disorder without prior episode # r/o PTSD The patient has a history of alcohol use for insomnia but is currently in sobriety and has been using marijuana over the past few years. His mother has a history of mental health issues. Psychologically, he describes his mother as very critical, which contributed to persistent feelings of inadequacy and a belief that he was never  good enough. 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 He is currently dealing with ongoing fatigue following a COVID infection, has undergone four surgeries, and is managing multiple health issues, which represents a significant change from his previously good physical condition. History: never seen by psychiatrist. Originally on lexapro 10 mg daily (started to be adherent in the last month)     Although he reports overall improvement in anxiety and panic attacks since uptitration of sertraline , there is a concern of drowsiness (feeling distant) since uptitration of sertraline .  He also reports sexual side effect from this medication.  Will cross-taper to Viibryd  to see if it is effective without these adverse reaction.  Discussed potential risk of nausea, serotonin syndrome.  He was advised to contact the office if any worsening in his symptoms.  Will continue the current dose of prazosin  to target vivid dreams, hyperarousal symptoms related to PTSD given he has significant benefit from this medication.  Discussed potential risk of orthostatic hypotension.  Will continue hydroxyzine  as needed for anxiety. He will continue to see Ms. Sharpe for therapy.    # r/o cognitive impairment - TSH wnl 06/2024 , folate, vitamin B 12 wnl 07/2024 He reports occasional episodes of confusion, and reports difficulty in attention.  Given the etiology could be multifactorial, will prioritize the treatment for mood symptoms as outlined above. We will plan to assess with [facility] after his mood improves for further assessment.  Plan Decrease sertraline  50 mg at night for one week, then discontinue  Start viibryd  5 mg daily for one week, then 10 mg daily for one week, then increase to 20 mg daily  Continue prazosin  1 mg at night  Continue hydroxyzine  25 mg daily as needed for anxiety - he declined a  refill Next appointment- 1/13 at 11 am, IP   Past trials of medication: lexapro, buspar   The patient demonstrates the following risk factors for suicide: Chronic risk factors for suicide include: psychiatric disorder of anxiety,  and substance use disorder. Acute risk factors for suicide include: loss (financial, interpersonal, professional). Protective factors for this patient include: positive social support, coping skills, and hope for the future. Considering these factors, the overall suicide risk at this point appears to be low. Patient is appropriate for outpatient follow up.     Collaboration of Care: Collaboration of Care: {BH OP Collaboration of Care:21014065}  Patient/Guardian was advised Release of Information must be obtained prior to any record release in order to collaborate their care with an outside provider. Patient/Guardian was advised if they have not already done so to contact the registration department to sign all necessary forms in order for us  to release information regarding their care.   Consent: Patient/Guardian gives verbal consent for treatment and assignment of benefits for services provided during this visit. Patient/Guardian expressed understanding and agreed to proceed.    Katheren Sleet, MD 12/10/2024, 12:43 PM     [1]  Allergies Allergen Reactions   Fish Protein-Containing Drug Products Nausea And Vomiting    Reaction to mussells only

## 2024-12-15 ENCOUNTER — Other Ambulatory Visit: Payer: Self-pay

## 2024-12-15 ENCOUNTER — Other Ambulatory Visit: Payer: Self-pay | Admitting: Psychiatry

## 2024-12-15 ENCOUNTER — Ambulatory Visit (INDEPENDENT_AMBULATORY_CARE_PROVIDER_SITE_OTHER): Admitting: Psychiatry

## 2024-12-15 ENCOUNTER — Encounter: Payer: Self-pay | Admitting: Psychiatry

## 2024-12-15 VITALS — BP 150/92 | HR 99 | Temp 97.6°F | Ht 71.0 in | Wt 181.0 lb

## 2024-12-15 DIAGNOSIS — F3341 Major depressive disorder, recurrent, in partial remission: Secondary | ICD-10-CM

## 2024-12-15 DIAGNOSIS — F410 Panic disorder [episodic paroxysmal anxiety] without agoraphobia: Secondary | ICD-10-CM

## 2024-12-15 DIAGNOSIS — F411 Generalized anxiety disorder: Secondary | ICD-10-CM | POA: Diagnosis not present

## 2024-12-15 MED ORDER — FLUOXETINE HCL 20 MG PO CAPS: 20.0000 mg | ORAL_CAPSULE | Freq: Every day | ORAL | 1 refills | Status: AC

## 2024-12-15 MED ORDER — HYDROXYZINE HCL 25 MG PO TABS: 25.0000 mg | ORAL_TABLET | Freq: Every day | ORAL | 1 refills | Status: DC | PRN

## 2024-12-15 NOTE — Patient Instructions (Signed)
 Start fluoxetine  20 mg daily  Hold sertraline  Continue prazosin  1 mg at night  Continue hydroxyzine  25 mg daily as needed for anxiety  Next appointment- 2/24  at 11:30

## 2024-12-16 ENCOUNTER — Ambulatory Visit: Admitting: Clinical

## 2024-12-16 DIAGNOSIS — F1021 Alcohol dependence, in remission: Secondary | ICD-10-CM

## 2024-12-16 DIAGNOSIS — F4323 Adjustment disorder with mixed anxiety and depressed mood: Secondary | ICD-10-CM | POA: Diagnosis not present

## 2024-12-16 NOTE — Progress Notes (Signed)
 "  [facility] Counselor/Therapist Progress Note  Patient ID: Adrian Hart, MRN: [id number],    Date: 12/16/2024  Time Spent: 2:37pm - 3:33pm : 56 minutes   Treatment Type: Individual Therapy  Reported Symptoms: Patient reported a decrease in anxiety  Mental Status Exam: Appearance:  Neat and Well Groomed     Behavior: Appropriate  Motor: Normal  Speech/Language:  Clear and Coherent and Normal Rate  Affect: Appropriate  Mood: normal  Thought process: normal  Thought content:   WNL  Sensory/Perceptual disturbances:   WNL  Orientation: oriented to person, place, time/date, and situation  Attention: Good  Concentration: Good  Memory: WNL  Fund of knowledge:  Good  Insight:   Good  Judgment:  Good  Impulse Control: Good   Risk Assessment: Danger to Self:  No Patient denied current suicidal ideation  Self-injurious Behavior: No Danger to Others: No Patient denied current homicidal ideation Duty to Warn:no Physical Aggression / Violence:No  Access to Firearms a concern: No  Gang Involvement:No   Subjective:  Patient stated, good in response to events since last session. Patient reported waking up and feeling good, making it through the day feeling good, things have been pretty good with the relationship. Patient reported no anxiety in reference to symptoms since last session. Patient stated, good in response to current mood. Patient reported during follow up with psychiatrist yesterday psychiatrist discontinued sertraline  and started Prozac . Patient reported a decrease in cold sweats after sertraline  was discontinued. Patient stated, everything has sort of improved. Patient stated, I've thought a lot about the past in the last several years. Patient stated,  I always knew I was different, even as a kid. Patient stated, one of my first memories was my mother telling me something was wrong with me because of the way I thought. Patient stated, I've  forgiven my mother, she was really tough on me. Patient reported patient has been told, you think differently than other people and patient stated, I've come to accept that. Patient reported statements from others regarding patient's thoughts previously triggered feelings of anger. Patient stated, early in my life I was confused by it and reported what did they mean. Patient reported if you thought different from the normal, you were odd in reference cultural beliefs. Patient stated, it held me back from creativity in my early years. Patient stated, I think that's kind of the beginning of some anxiety in reference to patient's childhood.   Interventions: Cognitive Behavioral Therapy. Clinician conducted session in person at clinician's office at [facility]. Reviewed events since last session and assessed for changes. Discussed agenda for today's session and patient's request to discuss patient's childhood. Discussed recent appointment with psychiatrist, changes in medication, and changes in symptoms. Provided supportive therapy and active listening as patient discussed patient's childhood experiences and patient's response. Explored the impact of childhood experiences on patient.     Collaboration of Care: Other not required at this time   Diagnosis:  Adjustment disorder with mixed anxiety and depressed mood   Alcohol use disorder, moderate, in sustained remission (HCC)     Plan: Patient is to utilize [employer], thought re-framing, relaxation techniques, mindfulness, behavioral activation, and coping strategies to decrease symptoms associated with their diagnosis. Frequency: bi-weekly  Modality: individual      Long-term goal:   Reduce overall level, frequency, and intensity of the feelings of depression and anxiety as evidenced by decrease in tremors, decreased concentration, obsessive thoughts, irritability, easily agitated, tingling  in hands, feeling  heat in extremities, restlessness, feeling on edge, fatigue, feelings unsettled, depressed mood, decreased appetite, loss of interest, low energy, decreased motivation, anxiety when in public settings and avoids going to stores from 2-3 times per month to 0 times per month per patient report for at least 3 consecutive months. Target Date: 08/11/25  Progress: progressing    Short-term goal:  Increase participation in positive activities, such as, getting up each day, develop a plan for each day, exercise, attending granddaughter's activities, attending social opportunities, kayaking, painting, family activities from 0 days per week to 7 days per week Target Date: 08/11/25  Progress: progressing    Increase participation in AA events per patient's report Target Date: 08/11/25  Progress: progressing    Practice coping strategies, such as, relaxation techniques, mindfulness exercises, meditation, grounding exercises 2-3 times per month in response to symptoms of anxiety  Target Date: 08/11/25  Progress: progressing    Identify, challenge, and replace negative thought patterns and negative self talk that contribute to feelings of depression and anxiety with positive thoughts, beliefs, and positive self talk per patient's report Target Date: 08/11/25  Progress: progressing    Being a healthy grieving process Target Date: 08/11/25  Progress: progressing    Develop realistic expectations as it relates to relationships Target Date: 08/11/25  Progress: progressing    Darice Seats, LCSW    "

## 2024-12-16 NOTE — Progress Notes (Signed)
   Adrian Seats, LCSW

## 2025-01-07 ENCOUNTER — Ambulatory Visit: Admitting: Clinical

## 2025-01-07 DIAGNOSIS — F1021 Alcohol dependence, in remission: Secondary | ICD-10-CM

## 2025-01-07 DIAGNOSIS — F4323 Adjustment disorder with mixed anxiety and depressed mood: Secondary | ICD-10-CM

## 2025-01-07 NOTE — Progress Notes (Signed)
 "  [facility] Counselor/Therapist Progress Note  Patient ID: Adrian Hart, MRN: [id number],    Date: 01/07/2025  Time Spent: 12:35pm - 1:36pm : 61 minutes   Treatment Type: Individual Therapy  Reported Symptoms: none reported  Mental Status Exam: Appearance:  Neat and Well Groomed     Behavior: Appropriate  Motor: Normal  Speech/Language:  Clear and Coherent and Normal Rate  Affect: Appropriate  Mood: Patient stated, good in response to current mood.   Thought process: normal  Thought content:   WNL  Sensory/Perceptual disturbances:   WNL  Orientation: oriented to person, place, time/date, and situation  Attention: Good  Concentration: Good  Memory: WNL  Fund of knowledge:  Good  Insight:   Good  Judgment:  Good  Impulse Control: Good   Risk Assessment: Danger to Self:  No Patient denied current suicidal ideation  Self-injurious Behavior: No Danger to Others: No Patient denied current homicidal ideation Duty to Warn:no Physical Aggression / Violence:No  Access to Firearms a concern: No  Gang Involvement:No   Subjective:  Patient stated, it's been going well, no anxiety, doing a lot of things, more than what I was in reference to events since last session and activity. Patient stated, I feel good, sleeping good. Patient reported patient has been attending grandchildren's events, significant other has been attending grandchildren's events with patient, patient has been going to stores, going to the gym 2-3 times per week, and reported improvement in appetite. Patient stated, now I don't dread it in reference to attending activities and reported looking forward to do it in reference to activities. Patient reported participating in activities outside of patient's home, improvement in relationship with significant other, medication, and stated, forcing myself to do some of those things as contributing factors to improvement in mood. Patient stated, I  just want to make sure I maintain this level of stability, I feel much more even keel than I was. Patient reported prior to hand surgery patient was painting and stated, that helped quite a bit, it really is a good outlet. Patient reported patient has experienced cold sweats since last session and reported cold sweats triggered anxiety due to the unknown cause. Patient reported patient would like to resume fly fishing. Patient reported the fatigue is gone. Patient stated, I can't think of any negatives in response to events since last session. Patient reported patient remains engaged in [place] and is increasing engagement in [place]. Patient reported patient has considered volunteering and stated, I'm excited.   Interventions: Cognitive Behavioral Therapy. Clinician conducted session in person at clinician's office at [facility]. Reviewed events since last session and assessed for changes. Discussed improvement in mood/symptoms. Reviewed patient's participation in positives activities since last session. Explored and identified contributing factors to improvement in mood/symptoms and stability in mood.  Provided psycho education relate to opposite action. Explored strategies to support stability in mood and increase participation in positive activities. Provided psycho education related to positive activities and benefit to mood, exercises of gratitude.     Collaboration of Care: Other not required at this time   Diagnosis:  Adjustment disorder with mixed anxiety and depressed mood   Alcohol use disorder, moderate, in sustained remission (HCC)     Plan: Patient is to utilize [employer], thought re-framing, relaxation techniques, mindfulness, behavioral activation, and coping strategies to decrease symptoms associated with their diagnosis. Frequency: bi-weekly  Modality: individual      Long-term goal:   Reduce overall level, frequency,  and intensity of the feelings  of depression and anxiety as evidenced by decrease in tremors, decreased concentration, obsessive thoughts, irritability, easily agitated, tingling in hands, feeling heat in extremities, restlessness, feeling on edge, fatigue, feelings unsettled, depressed mood, decreased appetite, loss of interest, low energy, decreased motivation, anxiety when in public settings and avoids going to stores from 2-3 times per month to 0 times per month per patient report for at least 3 consecutive months. Target Date: 08/11/25  Progress: progressing    Short-term goal:  Increase participation in positive activities, such as, getting up each day, develop a plan for each day, exercise, attending granddaughter's activities, attending social opportunities, kayaking, painting, family activities from 0 days per week to 7 days per week Target Date: 08/11/25  Progress: progressing    Increase participation in AA events per patient's report Target Date: 08/11/25  Progress: progressing    Practice coping strategies, such as, relaxation techniques, mindfulness exercises, meditation, grounding exercises 2-3 times per month in response to symptoms of anxiety  Target Date: 08/11/25  Progress: progressing    Identify, challenge, and replace negative thought patterns and negative self talk that contribute to feelings of depression and anxiety with positive thoughts, beliefs, and positive self talk per patient's report Target Date: 08/11/25  Progress: progressing    Being a healthy grieving process Target Date: 08/11/25  Progress: progressing    Develop realistic expectations as it relates to relationships Target Date: 08/11/25  Progress: progressing    Darice Seats, LCSW    "

## 2025-01-07 NOTE — Progress Notes (Signed)
   Darice Seats, LCSW

## 2025-01-26 ENCOUNTER — Ambulatory Visit: Admitting: Clinical

## 2025-01-26 ENCOUNTER — Ambulatory Visit: Admitting: Psychiatry

## 2025-02-10 ENCOUNTER — Ambulatory Visit: Admitting: Clinical

## 2025-06-01 ENCOUNTER — Other Ambulatory Visit

## 2025-06-02 ENCOUNTER — Ambulatory Visit: Admitting: Urology

## 2025-06-08 ENCOUNTER — Ambulatory Visit: Admitting: Urology
